# Patient Record
Sex: Female | Born: 1940 | Race: White | Hispanic: No | Marital: Married | Smoking: Never smoker
Health system: Southern US, Community
[De-identification: ages and names within clinical notes are randomized; demographics above are authoritative.]

## PROBLEM LIST (undated history)

## (undated) DIAGNOSIS — E785 Hyperlipidemia, unspecified: Secondary | ICD-10-CM

## (undated) DIAGNOSIS — K449 Diaphragmatic hernia without obstruction or gangrene: Secondary | ICD-10-CM

## (undated) DIAGNOSIS — T7840XA Allergy, unspecified, initial encounter: Secondary | ICD-10-CM

## (undated) DIAGNOSIS — K219 Gastro-esophageal reflux disease without esophagitis: Secondary | ICD-10-CM

## (undated) DIAGNOSIS — M8580 Other specified disorders of bone density and structure, unspecified site: Secondary | ICD-10-CM

## (undated) DIAGNOSIS — I10 Essential (primary) hypertension: Principal | ICD-10-CM

## (undated) DIAGNOSIS — I2510 Atherosclerotic heart disease of native coronary artery without angina pectoris: Secondary | ICD-10-CM

## (undated) DIAGNOSIS — I219 Acute myocardial infarction, unspecified: Secondary | ICD-10-CM

## (undated) DIAGNOSIS — J309 Allergic rhinitis, unspecified: Secondary | ICD-10-CM

## (undated) DIAGNOSIS — C4491 Basal cell carcinoma of skin, unspecified: Secondary | ICD-10-CM

## (undated) HISTORY — DX: Diaphragmatic hernia without obstruction or gangrene: K44.9

## (undated) HISTORY — PX: CATARACT EXTRACTION: SUR2

## (undated) HISTORY — PX: BASAL CELL CARCINOMA EXCISION: SHX1214

## (undated) HISTORY — PX: LAPAROSCOPIC ASSISTED VAGINAL HYSTERECTOMY: SHX5398

## (undated) HISTORY — DX: Allergic rhinitis, unspecified: J30.9

## (undated) HISTORY — DX: Acute myocardial infarction, unspecified: I21.9

## (undated) HISTORY — DX: Atherosclerotic heart disease of native coronary artery without angina pectoris: I25.10

## (undated) HISTORY — DX: Basal cell carcinoma of skin, unspecified: C44.91

## (undated) HISTORY — DX: Other specified disorders of bone density and structure, unspecified site: M85.80

## (undated) HISTORY — DX: Allergy, unspecified, initial encounter: T78.40XA

## (undated) HISTORY — PX: WISDOM TOOTH EXTRACTION: SHX21

## (undated) HISTORY — DX: Essential (primary) hypertension: I10

## (undated) HISTORY — DX: Hyperlipidemia, unspecified: E78.5

## (undated) HISTORY — PX: APPENDECTOMY: SHX54

## (undated) HISTORY — DX: Gastro-esophageal reflux disease without esophagitis: K21.9

---

## 1996-01-07 ENCOUNTER — Encounter: Payer: Self-pay | Admitting: Internal Medicine

## 1998-03-28 ENCOUNTER — Encounter: Payer: Self-pay | Admitting: Internal Medicine

## 2003-12-05 ENCOUNTER — Emergency Department (HOSPITAL_COMMUNITY): Admission: EM | Admit: 2003-12-05 | Discharge: 2003-12-05 | Payer: Self-pay | Admitting: Emergency Medicine

## 2006-12-23 DIAGNOSIS — I2510 Atherosclerotic heart disease of native coronary artery without angina pectoris: Secondary | ICD-10-CM

## 2006-12-23 HISTORY — DX: Atherosclerotic heart disease of native coronary artery without angina pectoris: I25.10

## 2007-09-23 HISTORY — PX: OTHER SURGICAL HISTORY: SHX169

## 2007-10-22 ENCOUNTER — Ambulatory Visit: Payer: Self-pay | Admitting: Cardiovascular Disease

## 2007-10-22 ENCOUNTER — Inpatient Hospital Stay (HOSPITAL_COMMUNITY): Admission: EM | Admit: 2007-10-22 | Discharge: 2007-10-25 | Payer: Self-pay | Admitting: Cardiovascular Disease

## 2007-10-22 ENCOUNTER — Encounter: Payer: Self-pay | Admitting: Cardiovascular Disease

## 2007-11-09 ENCOUNTER — Ambulatory Visit: Payer: Self-pay | Admitting: Cardiovascular Disease

## 2007-12-14 ENCOUNTER — Ambulatory Visit: Payer: Self-pay | Admitting: Cardiovascular Disease

## 2007-12-14 ENCOUNTER — Encounter: Payer: Self-pay | Admitting: Cardiovascular Disease

## 2007-12-14 ENCOUNTER — Ambulatory Visit: Payer: Self-pay

## 2007-12-14 LAB — CONVERTED CEMR LAB
ALT: 19 units/L (ref 0–35)
Albumin: 3.6 g/dL (ref 3.5–5.2)
Alkaline Phosphatase: 97 units/L (ref 39–117)
CO2: 28 meq/L (ref 19–32)
Chloride: 105 meq/L (ref 96–112)
GFR calc Af Amer: 92 mL/min
GFR calc non Af Amer: 76 mL/min
Glucose, Bld: 101 mg/dL — ABNORMAL HIGH (ref 70–99)
Total CHOL/HDL Ratio: 2.7
Total Protein: 6.8 g/dL (ref 6.0–8.3)

## 2008-02-23 ENCOUNTER — Ambulatory Visit: Payer: Self-pay | Admitting: Internal Medicine

## 2008-03-14 ENCOUNTER — Ambulatory Visit: Payer: Self-pay | Admitting: Cardiovascular Disease

## 2008-03-29 ENCOUNTER — Ambulatory Visit: Payer: Self-pay | Admitting: Internal Medicine

## 2008-03-29 DIAGNOSIS — I10 Essential (primary) hypertension: Secondary | ICD-10-CM

## 2008-03-29 DIAGNOSIS — E785 Hyperlipidemia, unspecified: Secondary | ICD-10-CM

## 2008-03-29 DIAGNOSIS — I252 Old myocardial infarction: Secondary | ICD-10-CM | POA: Insufficient documentation

## 2008-03-29 HISTORY — DX: Essential (primary) hypertension: I10

## 2008-03-29 HISTORY — DX: Hyperlipidemia, unspecified: E78.5

## 2008-03-29 LAB — HM MAMMOGRAPHY

## 2008-03-30 LAB — CONVERTED CEMR LAB
ALT: 23 units/L (ref 0–35)
Alkaline Phosphatase: 104 units/L (ref 39–117)
Basophils Relative: 0.4 % (ref 0.0–1.0)
Bilirubin, Direct: 0.1 mg/dL (ref 0.0–0.3)
Chloride: 107 meq/L (ref 96–112)
Cholesterol: 134 mg/dL (ref 0–200)
Creatinine, Ser: 0.8 mg/dL (ref 0.4–1.2)
Eosinophils Relative: 1.5 % (ref 0.0–5.0)
Glucose, Bld: 88 mg/dL (ref 70–99)
HCT: 39.7 % (ref 36.0–46.0)
HDL: 45.4 mg/dL (ref >39.0)
Hemoglobin: 12.7 g/dL (ref 12.0–15.0)
LDL Cholesterol: 77 mg/dL (ref 0–99)
MCHC: 32 g/dL (ref 30.0–36.0)
MCV: 89.8 fL (ref 78.0–100.0)
Monocytes Relative: 6.6 % (ref 3.0–12.0)
TSH: 2.56 microintl units/mL (ref 0.35–5.50)
Total Bilirubin: 0.7 mg/dL (ref 0.3–1.2)
Total CHOL/HDL Ratio: 3
Total Protein: 6.2 g/dL (ref 6.0–8.3)

## 2008-04-04 ENCOUNTER — Ambulatory Visit: Payer: Self-pay | Admitting: Internal Medicine

## 2008-04-08 DIAGNOSIS — J309 Allergic rhinitis, unspecified: Secondary | ICD-10-CM

## 2008-04-08 HISTORY — DX: Allergic rhinitis, unspecified: J30.9

## 2008-05-05 ENCOUNTER — Ambulatory Visit: Payer: Self-pay | Admitting: Gastroenterology

## 2008-05-05 DIAGNOSIS — K219 Gastro-esophageal reflux disease without esophagitis: Secondary | ICD-10-CM

## 2008-05-24 ENCOUNTER — Ambulatory Visit: Payer: Self-pay | Admitting: Cardiovascular Disease

## 2008-05-24 LAB — CONVERTED CEMR LAB
ALT: 18 units/L (ref 0–35)
Total Bilirubin: 0.8 mg/dL (ref 0.3–1.2)

## 2008-06-29 ENCOUNTER — Ambulatory Visit: Payer: Self-pay | Admitting: Internal Medicine

## 2008-09-29 ENCOUNTER — Ambulatory Visit: Payer: Self-pay | Admitting: Internal Medicine

## 2008-09-29 IMAGING — CR DG CHEST 1V PORT
1 series · 1 of 1 positions shown · non-contrast
Comparison: None.

CLINICAL DATA: Post cardiac catheterization. MI.

PORTABLE CHEST - 1 VIEW  [DATE]/2006 8220 hours:

[view not recorded]
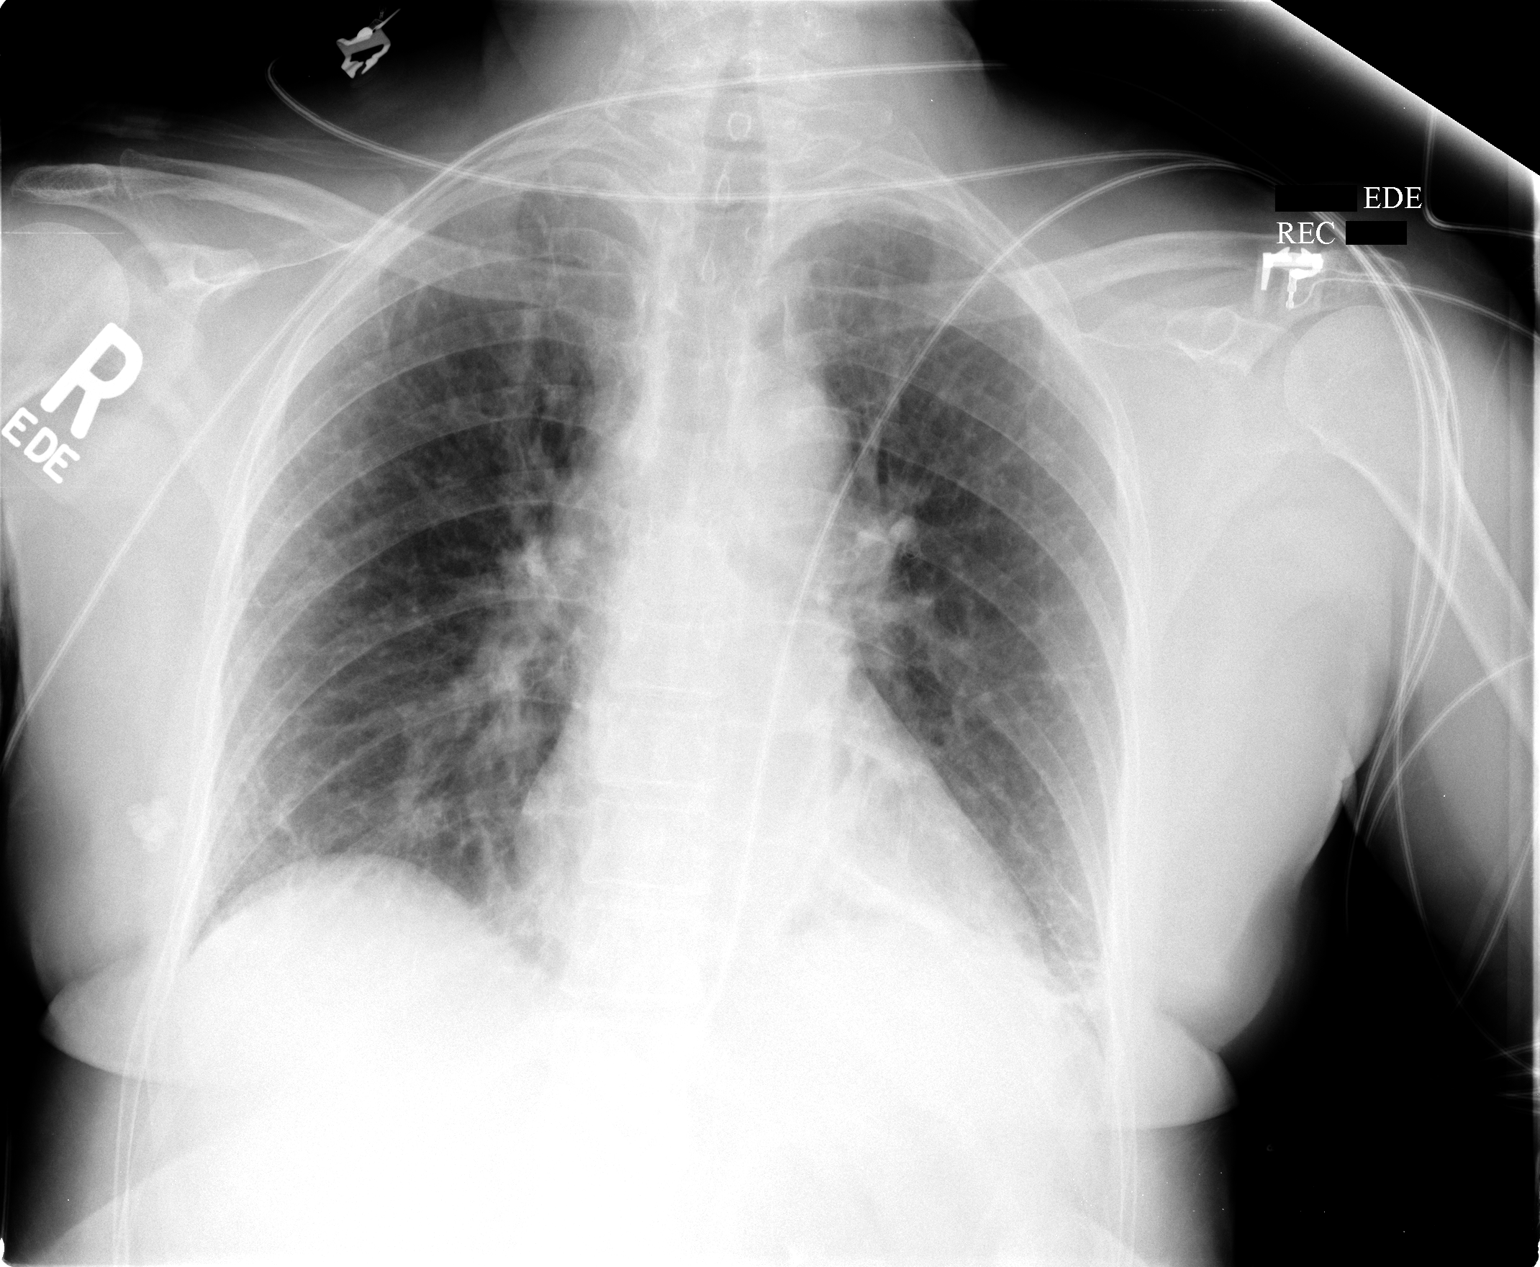

[1 of 1 positions shown; findings below may reference images not displayed]

FINDINGS: Heart mildly enlarged. Mild diffuse interstitial pulmonary edema.
Possible small bilateral pleural effusions. Linear atelectasis at the left base.
No localized airspace consolidation. Calcification noted within the right
breast.
IMPRESSION: Mild cardiomegaly and mild diffuse interstitial pulmonary edema. Probable small
effusions. Linear atelectasis and left lower lobe. Probable calcified
fibroadenoma in the right breast.

## 2008-10-19 ENCOUNTER — Ambulatory Visit: Payer: Self-pay | Admitting: Cardiovascular Disease

## 2008-10-28 ENCOUNTER — Ambulatory Visit: Payer: Self-pay | Admitting: Internal Medicine

## 2008-11-01 ENCOUNTER — Telehealth: Payer: Self-pay | Admitting: Internal Medicine

## 2008-11-01 LAB — CONVERTED CEMR LAB
Alkaline Phosphatase: 93 units/L (ref 39–117)
Bilirubin, Direct: 0.1 mg/dL (ref 0.0–0.3)
CO2: 29 meq/L (ref 19–32)
Chloride: 109 meq/L (ref 96–112)
Glucose, Bld: 109 mg/dL — ABNORMAL HIGH (ref 70–99)
LDL Cholesterol: 66 mg/dL (ref 0–99)
Potassium: 3.8 meq/L (ref 3.5–5.1)
Total Bilirubin: 0.8 mg/dL (ref 0.3–1.2)
Total Protein: 6.6 g/dL (ref 6.0–8.3)
VLDL: 10 mg/dL (ref 0–40)

## 2008-11-04 ENCOUNTER — Telehealth: Payer: Self-pay | Admitting: Internal Medicine

## 2008-11-07 ENCOUNTER — Telehealth: Payer: Self-pay | Admitting: Internal Medicine

## 2008-12-01 ENCOUNTER — Ambulatory Visit: Payer: Self-pay | Admitting: Gastroenterology

## 2008-12-09 ENCOUNTER — Ambulatory Visit: Payer: Self-pay | Admitting: Gastroenterology

## 2008-12-09 LAB — HM COLONOSCOPY

## 2009-03-22 DIAGNOSIS — I2589 Other forms of chronic ischemic heart disease: Secondary | ICD-10-CM | POA: Insufficient documentation

## 2009-03-28 ENCOUNTER — Encounter: Payer: Self-pay | Admitting: Cardiovascular Disease

## 2009-03-28 ENCOUNTER — Ambulatory Visit: Payer: Self-pay | Admitting: Cardiovascular Disease

## 2009-06-21 ENCOUNTER — Ambulatory Visit: Payer: Self-pay | Admitting: Internal Medicine

## 2009-06-22 LAB — CONVERTED CEMR LAB
ALT: 16 units/L (ref 0–35)
Alkaline Phosphatase: 75 units/L (ref 39–117)
BUN: 25 mg/dL — ABNORMAL HIGH (ref 6–23)
CO2: 29 meq/L (ref 19–32)
Chloride: 108 meq/L (ref 96–112)
Cholesterol: 153 mg/dL (ref 0–200)
Creatinine, Ser: 0.9 mg/dL (ref 0.4–1.2)
GFR calc non Af Amer: 66.02 mL/min (ref >60)
LDL Cholesterol: 83 mg/dL (ref 0–99)
TSH: 2.98 microintl units/mL (ref 0.35–5.50)
Total Protein: 6.8 g/dL (ref 6.0–8.3)
Triglycerides: 62 mg/dL (ref 0.0–149.0)

## 2009-07-17 ENCOUNTER — Encounter: Payer: Self-pay | Admitting: Internal Medicine

## 2009-09-27 ENCOUNTER — Ambulatory Visit: Payer: Self-pay | Admitting: Internal Medicine

## 2009-11-08 ENCOUNTER — Ambulatory Visit: Payer: Self-pay | Admitting: Cardiovascular Disease

## 2009-11-10 DIAGNOSIS — I2510 Atherosclerotic heart disease of native coronary artery without angina pectoris: Secondary | ICD-10-CM | POA: Insufficient documentation

## 2009-11-10 HISTORY — DX: Atherosclerotic heart disease of native coronary artery without angina pectoris: I25.10

## 2009-12-27 ENCOUNTER — Ambulatory Visit: Payer: Self-pay | Admitting: Internal Medicine

## 2010-01-01 LAB — CONVERTED CEMR LAB
ALT: 18 units/L (ref 0–35)
AST: 18 units/L (ref 0–37)
Albumin: 3.8 g/dL (ref 3.5–5.2)
BUN: 12 mg/dL (ref 6–23)
CO2: 30 meq/L (ref 19–32)
Calcium: 9.4 mg/dL (ref 8.4–10.5)
Chloride: 107 meq/L (ref 96–112)
Creatinine, Ser: 0.7 mg/dL (ref 0.4–1.2)
Glucose, Bld: 101 mg/dL — ABNORMAL HIGH (ref 70–99)
HDL: 56.5 mg/dL (ref >39.00)
TSH: 2.42 microintl units/mL (ref 0.35–5.50)
Total Bilirubin: 1 mg/dL (ref 0.3–1.2)
Total Protein: 6.7 g/dL (ref 6.0–8.3)

## 2010-06-27 ENCOUNTER — Ambulatory Visit: Payer: Self-pay | Admitting: Internal Medicine

## 2010-06-29 LAB — CONVERTED CEMR LAB
ALT: 17 units/L (ref 0–35)
AST: 18 units/L (ref 0–37)
Albumin: 4.2 g/dL (ref 3.5–5.2)
Alkaline Phosphatase: 82 units/L (ref 39–117)
BUN: 15 mg/dL (ref 6–23)
Basophils Relative: 0.3 % (ref 0.0–3.0)
Bilirubin, Direct: 0.1 mg/dL (ref 0.0–0.3)
CO2: 30 meq/L (ref 19–32)
Chloride: 111 meq/L (ref 96–112)
Creatinine, Ser: 0.8 mg/dL (ref 0.4–1.2)
Eosinophils Relative: 1 % (ref 0.0–5.0)
GFR calc non Af Amer: 74.33 mL/min (ref >60)
Glucose, Bld: 96 mg/dL (ref 70–99)
HCT: 38.2 % (ref 36.0–46.0)
Lymphocytes Relative: 23.1 % (ref 12.0–46.0)
Lymphs Abs: 2.1 10*3/uL (ref 0.7–4.0)
Monocytes Absolute: 0.5 10*3/uL (ref 0.1–1.0)
Neutro Abs: 6.3 10*3/uL (ref 1.4–7.7)
Platelets: 253 10*3/uL (ref 150.0–400.0)
Potassium: 5.7 meq/L — ABNORMAL HIGH (ref 3.5–5.1)
RDW: 14.6 % (ref 11.5–14.6)
Sodium: 147 meq/L — ABNORMAL HIGH (ref 135–145)
Total Protein: 7 g/dL (ref 6.0–8.3)

## 2010-07-02 ENCOUNTER — Ambulatory Visit: Payer: Self-pay | Admitting: Internal Medicine

## 2010-07-03 LAB — CONVERTED CEMR LAB: Potassium: 4.8 meq/L (ref 3.5–5.1)

## 2010-08-08 ENCOUNTER — Ambulatory Visit: Payer: Self-pay | Admitting: Internal Medicine

## 2010-10-04 ENCOUNTER — Ambulatory Visit: Payer: Self-pay | Admitting: Internal Medicine

## 2010-11-19 ENCOUNTER — Encounter: Payer: Self-pay | Admitting: Cardiovascular Disease

## 2010-11-19 ENCOUNTER — Ambulatory Visit: Payer: Self-pay | Admitting: Cardiovascular Disease

## 2010-12-10 ENCOUNTER — Telehealth: Payer: Self-pay | Admitting: Cardiovascular Disease

## 2011-01-22 NOTE — Assessment & Plan Note (Signed)
Summary: f1y  Medications Added LIPITOR 40 MG TABS (ATORVASTATIN CALCIUM) Take one tablet by mouth daily.      Allergies Added:   Visit Type:  Follow-up Primary Provider:  Birdie Sons MD  CC:  none.  History of Present Illness: 70 year old woman with coronary artery disease presents for followup. She had an anterior MI in 2008. 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 However, her overall LV function has returned to normal.   She has no complaints today. Denies chest pain, dyspnea, edema, or palpitations. She is compliant with her medications. She only takes omeprazole on occasion.    Current Medications (verified): 1)  Plavix 75 Mg  Tabs (Clopidogrel Bisulfate) .... One By Mouth Daily 2)  Carvedilol 12.5 Mg  Tabs (Carvedilol) .... One By Mouth Two Times A Day 3)  Simvastatin 40 Mg Tabs (Simvastatin) .... Take One Tablet At Bedtime 4)  Nitrostat 0.3 Mg  Subl (Nitroglycerin) .... Prn 5)  Aspirin 81 Mg  Tbec (Aspirin) .... One By Mouth Every Day 6)  Omeprazole 20 Mg Cpdr (Omeprazole) .... Two Times A Day 7)  Losartan Potassium 100 Mg Tabs (Losartan Potassium) .... Take 1 Tablet By Mouth Once A Day  Allergies (verified): 1)  ! Penicillin  Past History:  Past medical history reviewed for relevance to current acute and chronic problems.  Past Medical History: Reviewed history from 11/08/2009 and no changes required. CAD, UNSPECIFIED SITE (ICD-414.00) status post anterior myocardial infarction 2008, treated with a drug-eluting stent to the LAD. GERD (ICD-530.81) HYPERTENSION (ICD-401.9) HYPERLIPIDEMIA (ICD-272.4) ALLERGIC RHINITIS (ICD-477.9)  Review of Systems       Negative except as per HPI   Vital Signs:  Patient profile:   70 year old female Height:      65 inches Weight:      154.50 pounds BMI:     25.80 Pulse rate:   71 / minute Pulse rhythm:   regular Resp:     18 per minute BP sitting:   134 / 72  (left arm) Cuff  size:   regular  Vitals Entered By: Lisabeth Devoid RN (November 19, 2010 10:03 AM)  Physical Exam  General:  Pt is alert and oriented, in no acute distress. HEENT: normal Neck: normal carotid upstrokes without bruits, JVP normal Lungs: CTA CV: RRR without murmur or gallop Abd: soft, NT, positive BS, no bruit, no organomegaly Ext: no clubbing, cyanosis, or edema. peripheral pulses 2+ and equal Skin: warm and dry without rash    EKG  Procedure date:  11/19/2010  Findings:      NSR 71 bpm, within normal limits.  Impression & Recommendations:  Problem # 1:  CAD, NATIVE VESSEL (ICD-414.01) Pt stable without angina. She is 3 years out from her MI, but her coronary anatomy was complex (treated with overlapping DES at the site of a bifurcation). I would recommend staying on ASA and plavix indefinitely as long as she tolerates this. With plavix going generic in the near future this should be less of a financial for her.   Her updated medication list for this problem includes:    Plavix 75 Mg Tabs (Clopidogrel bisulfate) ..... One by mouth daily    Carvedilol 12.5 Mg Tabs (Carvedilol) ..... One by mouth two times a day    Nitrostat 0.3 Mg Subl (Nitroglycerin) .Marland Kitchen... Prn    Aspirin 81 Mg Tbec (Aspirin) ..... One by mouth every day  Orders: EKG w/ Interpretation (93000)  Problem # 2:  HYPERTENSION (ICD-401.9) BP controlled.   Her updated medication list for this problem includes:    Carvedilol 12.5 Mg Tabs (Carvedilol) ..... One by mouth two times a day    Aspirin 81 Mg Tbec (Aspirin) ..... One by mouth every day    Losartan Potassium 100 Mg Tabs (Losartan potassium) .Marland Kitchen... Take 1 tablet by mouth once a day  BP today: 134/72 Prior BP: 138/70 (08/08/2010)  Labs Reviewed: K+: 4.8 (07/02/2010) Creat: : 0.8 (06/27/2010)   Chol: 193 (06/27/2010)   HDL: 57.10 (06/27/2010)   LDL: 107 (06/27/2010)   TG: 144.0 (06/27/2010)  Problem # 3:  HYPERLIPIDEMIA (ICD-272.4) LDL above goal. She has  been on simvastatin because of cost. Recommend change to lipitor 40 mg in December when it goes generic. She has followup labs already scheduled with Dr Cato Mulligan in February.  Her updated medication list for this problem includes:    Lipitor 40 Mg Tabs (Atorvastatin calcium) .Marland Kitchen... Take one tablet by mouth daily.  CHOL: 193 (06/27/2010)   LDL: 107 (06/27/2010)   HDL: 57.10 (06/27/2010)   TG: 144.0 (06/27/2010)  Patient Instructions: 1)  Your physician recommends that you schedule a follow-up appointment in: 1 year with Dr. Excell Seltzer 2)  Your physician has recommended you make the following change in your medication:  Prescriptions: LIPITOR 40 MG TABS (ATORVASTATIN CALCIUM) Take one tablet by mouth daily.  #30 x 11   Entered by:   Lisabeth Devoid RN   Authorized by:   Norva Karvonen, MD   Signed by:   Norva Karvonen, MD on 11/19/2010   Method used:   Historical   RxID:   [id number]

## 2011-01-22 NOTE — Assessment & Plan Note (Signed)
Summary: 6 MNTH ROV//SLM   Vital Signs:  Patient profile:   70 year old female Weight:      154 pounds Temp:     98.2 degrees F Pulse rate:   64 / minute Resp:     12 per minute BP sitting:   146 / 86  (left arm)  Vitals Entered By: Gladis Riffle, RN (December 27, 2009 9:26 AM)   Primary Care Provider:  Birdie Sons MD   History of Present Illness:  Follow-Up Visit      This is a 70 year old woman who presents for Follow-up visit.  The patient denies chest pain, palpitations, dizziness, syncope, edema, SOB, DOE, PND, and orthopnea.  Since the last visit the patient notes no new problems or concerns.  The patient reports taking meds as prescribed.  When questioned about possible medication side effects, the patient notes none.    Preventive Screening-Counseling & Management  Alcohol-Tobacco     Smoking Status: never  Current Problems (verified): 1)  Cad, Native Vessel  (ICD-414.01) 2)  Ischemic Cardiomyopathy  (ICD-414.8) 3)  Gerd  (ICD-530.81) 4)  Hypertension  (ICD-401.9) 5)  Hyperlipidemia  (ICD-272.4) 6)  Myocardial Infarction, Hx of  (ICD-412) 7)  Allergic Rhinitis  (ICD-477.9)  Current Medications (verified): 1)  Plavix 75 Mg  Tabs (Clopidogrel Bisulfate) .... One By Mouth Daily 2)  Carvedilol 12.5 Mg  Tabs (Carvedilol) .... One By Mouth Bid 3)  Lisinopril 20 Mg Tabs (Lisinopril) .Marland Kitchen.. 1 Tablet By Mouth Daily 4)  Simvastatin 40 Mg Tabs (Simvastatin) .... Take One Tablet At Bedtime 5)  Nitrostat 0.3 Mg  Subl (Nitroglycerin) .... Prn 6)  Aspirin 81 Mg  Tbec (Aspirin) .... One By Mouth Every Day 7)  Pantoprazole Sodium 40 Mg Tbec (Pantoprazole Sodium) .... Take 1 Tablet By Mouth Once A Day  Allergies: 1)  ! Penicillin  Comments:  Nurse/Medical Assistant: 6 month rov, discuss pantoprazole  The patient's medications and allergies were reviewed with the patient and were updated in the Medication and Allergy Lists. Gladis Riffle, RN (December 27, 2009 9:27 AM)  Past  History:  Past Medical History: Last updated: 11/08/2009 CAD, UNSPECIFIED SITE (ICD-414.00) status post anterior myocardial infarction 2008, treated with a drug-eluting stent to the LAD. GERD (ICD-530.81) HYPERTENSION (ICD-401.9) HYPERLIPIDEMIA (ICD-272.4) ALLERGIC RHINITIS (ICD-477.9)  Past Surgical History: Last updated: 12-Apr-2009  Percutaneous transluminal coronary angioplasty and drug-eluting   stent placement in the left anterior descending 09-23-29-2008 Appendectomy  Family History: Last updated: 04/12/09  Her mother died with a history of coronary artery   disease, and she also has one brother with coronary artery disease who   has had a stent, but her father did not have heart disease.      Social History: Last updated: Apr 12, 2009 She lives in [address] with her husband and works at   [employer].  She has no history of alcohol, tobacco or drug abuse.  She has 3 boys and 1 girl.  Risk Factors: Caffeine Use: 2 (05/05/2008) Exercise: no (03/29/2008)  Risk Factors: Smoking Status: never (12/27/2009)  Review of Systems       All other systems reviewed and were negative   Physical Exam  General:  alert and well-developed.   Head:  normocephalic and atraumatic.   Eyes:  pupils equal and pupils round.   Ears:  R ear normal and L ear normal.   Neck:  No deformities, masses, or tenderness noted. Chest Wall:  no deformities and no  tenderness.   Lungs:  normal respiratory effort and no intercostal retractions.   Heart:  normal rate and regular rhythm.   Abdomen:  Soft, nontender and nondistended. No masses, hepatosplenomegaly or hernias noted. Normal bowel sounds. Msk:  No deformity or scoliosis noted of thoracic or lumbar spine.   Neurologic:  cranial nerves II-XII intact and gait normal.     Impression & Recommendations:  Problem # 1:  CAD, NATIVE VESSEL (ICD-414.01) no sxs continue current medications  Her updated medication list for this problem  includes:    Plavix 75 Mg Tabs (Clopidogrel bisulfate) ..... One by mouth daily    Carvedilol 12.5 Mg Tabs (Carvedilol) ..... One by mouth bid    Lisinopril 40 Mg Tabs (Lisinopril) .Marland Kitchen... 1 by mouth once daily    Nitrostat 0.3 Mg Subl (Nitroglycerin) .Marland Kitchen... Prn    Aspirin 81 Mg Tbec (Aspirin) ..... One by mouth every day  Labs Reviewed: Chol: 153 (06/21/2009)   HDL: 57.70 (06/21/2009)   LDL: 83 (06/21/2009)   TG: 62.0 (06/21/2009)  Problem # 2:  GERD (ICD-530.81) no sxs change meds because of cost The following medications were removed from the medication list:    Pantoprazole Sodium 40 Mg Tbec (Pantoprazole sodium) .Marland Kitchen... Take 1 tablet by mouth once a day Her updated medication list for this problem includes:    Omeprazole 20 Mg Cpdr (Omeprazole) ..... One by mouth daily  Problem # 3:  HYPERTENSION (ICD-401.9) increse meds see list side effects discussed Her updated medication list for this problem includes:    Carvedilol 12.5 Mg Tabs (Carvedilol) ..... One by mouth bid    Lisinopril 40 Mg Tabs (Lisinopril) .Marland Kitchen... 1 by mouth once daily  BP today: 146/86 Prior BP: 122/70 (11/08/2009)  Labs Reviewed: K+: 4.6 (06/21/2009) Creat: : 0.9 (06/21/2009)   Chol: 153 (06/21/2009)   HDL: 57.70 (06/21/2009)   LDL: 83 (06/21/2009)   TG: 62.0 (06/21/2009)  Orders: TLB-BMP (Basic Metabolic Panel-BMET) (80048-METABOL) Venipuncture (09811)  Problem # 4:  HYPERLIPIDEMIA (ICD-272.4) well controlled continue current medications  Her updated medication list for this problem includes:    Simvastatin 40 Mg Tabs (Simvastatin) .Marland Kitchen... Take one tablet at bedtime  Labs Reviewed: SGOT: 18 (06/21/2009)   SGPT: 16 (06/21/2009)   HDL:57.70 (06/21/2009), 49.7 (10/28/2008)  LDL:83 (06/21/2009), 66 (10/28/2008)  Chol:153 (06/21/2009), 126 (10/28/2008)  Trig:62.0 (06/21/2009), 51 (10/28/2008)  Orders: TLB-Lipid Panel (80061-LIPID) TLB-Hepatic/Liver Function Pnl (80076-HEPATIC) TLB-TSH (Thyroid Stimulating  Hormone) (84443-TSH)  Complete Medication List: 1)  Plavix 75 Mg Tabs (Clopidogrel bisulfate) .... One by mouth daily 2)  Carvedilol 12.5 Mg Tabs (Carvedilol) .... One by mouth bid 3)  Lisinopril 40 Mg Tabs (Lisinopril) .Marland Kitchen.. 1 by mouth once daily 4)  Simvastatin 40 Mg Tabs (Simvastatin) .... Take one tablet at bedtime 5)  Nitrostat 0.3 Mg Subl (Nitroglycerin) .... Prn 6)  Aspirin 81 Mg Tbec (Aspirin) .... One by mouth every day 7)  Omeprazole 20 Mg Cpdr (Omeprazole) .... One by mouth daily Prescriptions: SIMVASTATIN 40 MG TABS (SIMVASTATIN) Take one tablet at bedtime  #90 x 3   Entered and Authorized by:   Birdie Sons MD   Signed by:   Birdie Sons MD on 12/27/2009   Method used:   Electronically to        [facility] (retail)       [address]       [place], [place]  [address]  Ph: [phone]       Fax: [phone]   RxID:   [id number] LISINOPRIL 40 MG TABS (LISINOPRIL) 1 by mouth once daily  #90 x 3   Entered and Authorized by:   Birdie Sons MD   Signed by:   Birdie Sons MD on 12/27/2009   Method used:   Electronically to        [facility] (retail)       [address]       [place], [place]  [address]       Ph: [phone]       Fax: [phone]   RxID:   [id number] OMEPRAZOLE 20 MG CPDR (OMEPRAZOLE) one by mouth daily  #90 x 3   Entered and Authorized by:   Birdie Sons MD   Signed by:   Birdie Sons MD on 12/27/2009   Method used:   Electronically to        [facility] (retail)       [address]       [place], [place]  [address]       Ph: [phone]       Fax: [phone]   RxID:   [id number] PANTOPRAZOLE SODIUM 40 MG TBEC (PANTOPRAZOLE SODIUM) Take 1 tablet by mouth once a day  #90 x 3   Entered and Authorized by:   Birdie Sons MD   Signed by:   Birdie Sons MD on 12/27/2009   Method used:   Electronically to        [facility].* (retail)       [address]       [place], [place]  [address]       Ph: [phone]       Fax: [phone]   RxID:   [id number]

## 2011-01-22 NOTE — Assessment & Plan Note (Signed)
Summary: 6 MONTH ROA//LH   Vital Signs:  Patient profile:   70 year old female Weight:      156 pounds BMI:     26.05 Temp:     98.3 degrees F oral Pulse rate:   62 / minute Pulse rhythm:   regular Resp:     12 per minute BP sitting:   124 / 68  (left arm) Cuff size:   regular  Vitals Entered By: Gladis Riffle, RN (June 27, 2010 9:09 AM) CC: 6 month rov, fasting Is Patient Diabetic? No   Primary Care Provider:  Birdie Sons MD  CC:  6 month rov and fasting.  History of Present Illness:  Follow-Up Visit      This is a 70 year old woman who presents for Follow-up visit.  The patient denies chest pain and palpitations.  Since the last visit the patient notes no new problems or concerns.  The patient reports taking meds as prescribed.  When questioned about possible medication side effects, the patient notes none.    All other systems reviewed and were negative   Preventive Screening-Counseling & Management  Alcohol-Tobacco     Smoking Status: never  Current Problems (verified): 1)  Cad, Native Vessel  (ICD-414.01) 2)  Ischemic Cardiomyopathy  (ICD-414.8) 3)  Gerd  (ICD-530.81) 4)  Hypertension  (ICD-401.9) 5)  Hyperlipidemia  (ICD-272.4) 6)  Myocardial Infarction, Hx of  (ICD-412) 7)  Allergic Rhinitis  (ICD-477.9)  Current Medications (verified): 1)  Plavix 75 Mg  Tabs (Clopidogrel Bisulfate) .... One By Mouth Daily 2)  Carvedilol 12.5 Mg  Tabs (Carvedilol) .... One By Mouth Two Times A Day 3)  Lisinopril 40 Mg Tabs (Lisinopril) .Marland Kitchen.. 1 By Mouth Once Daily 4)  Simvastatin 40 Mg Tabs (Simvastatin) .... Take One Tablet At Bedtime 5)  Nitrostat 0.3 Mg  Subl (Nitroglycerin) .... Prn 6)  Aspirin 81 Mg  Tbec (Aspirin) .... One By Mouth Every Day 7)  Omeprazole 20 Mg Cpdr (Omeprazole) .... One By Mouth Daily  Allergies: 1)  ! Penicillin  Past History:  Past Medical History: Last updated: 11/08/2009 CAD, UNSPECIFIED SITE (ICD-414.00) status post anterior myocardial  infarction 2008, treated with a drug-eluting stent to the LAD. GERD (ICD-530.81) HYPERTENSION (ICD-401.9) HYPERLIPIDEMIA (ICD-272.4) ALLERGIC RHINITIS (ICD-477.9)  Past Surgical History: Last updated: 03-29-09  Percutaneous transluminal coronary angioplasty and drug-eluting   stent placement in the left anterior descending 09-23-29-2008 Appendectomy  Family History: Last updated: 2009-03-29  Her mother died with a history of coronary artery   disease, and she also has one brother with coronary artery disease who   has had a stent, but her father did not have heart disease.      Social History: Last updated: 06/27/2010 She lives in [address] with her husband and works at   [employer].   She has no history of alcohol, tobacco or drug abuse.  She has 3 boys and 1 girl.  Risk Factors: Caffeine Use: 2 (05/05/2008) Exercise: no (03/29/2008)  Risk Factors: Smoking Status: never (06/27/2010)  Social History: She lives in [place] with her husband and works at   [employer].   She has no history of alcohol, tobacco or drug abuse.  She has 3 boys and 1 girl.  Physical Exam  General:  alert and well-developed.   Head:  normocephalic and atraumatic.   Eyes:  pupils equal and pupils round.   Ears:  R ear normal and L ear normal.  Neck:  No deformities, masses, or tenderness noted. Chest Wall:  no deformities and no tenderness.   Lungs:  Normal respiratory effort, chest expands symmetrically. Lungs are clear to auscultation, no crackles or wheezes. Heart:  normal rate and regular rhythm.   Abdomen:  Soft, nontender and nondistended. No masses, hepatosplenomegaly or hernias noted. Normal bowel sounds.   Impression & Recommendations:  Problem # 1:  CAD, NATIVE VESSEL (ICD-414.01)  no sxs Her updated medication list for this problem includes:    Plavix 75 Mg Tabs (Clopidogrel bisulfate) ..... One by mouth daily    Carvedilol 12.5 Mg Tabs (Carvedilol) .....  One by mouth two times a day    Lisinopril 40 Mg Tabs (Lisinopril) .Marland Kitchen... 1 by mouth once daily    Nitrostat 0.3 Mg Subl (Nitroglycerin) .Marland Kitchen... Prn    Aspirin 81 Mg Tbec (Aspirin) ..... One by mouth every day  Labs Reviewed: Chol: 161 (12/27/2009)   HDL: 56.50 (12/27/2009)   LDL: 88 (12/27/2009)   TG: 85.0 (12/27/2009)  Orders: Venipuncture (09811) TLB-Lipid Panel (80061-LIPID) TLB-Hepatic/Liver Function Pnl (80076-HEPATIC)  Problem # 2:  ISCHEMIC CARDIOMYOPATHY (ICD-414.8) no sxs Her updated medication list for this problem includes:    Plavix 75 Mg Tabs (Clopidogrel bisulfate) ..... One by mouth daily    Carvedilol 12.5 Mg Tabs (Carvedilol) ..... One by mouth two times a day    Lisinopril 40 Mg Tabs (Lisinopril) .Marland Kitchen... 1 by mouth once daily    Nitrostat 0.3 Mg Subl (Nitroglycerin) .Marland Kitchen... Prn    Aspirin 81 Mg Tbec (Aspirin) ..... One by mouth every day  Problem # 3:  GERD (ICD-530.81) this is her main complaint increase PPI to two times a day if no results may need to consider definitive procedure Her updated medication list for this problem includes:    Omeprazole 20 Mg Cpdr (Omeprazole) .Marland Kitchen..Marland Kitchen Two times a day  Complete Medication List: 1)  Plavix 75 Mg Tabs (Clopidogrel bisulfate) .... One by mouth daily 2)  Carvedilol 12.5 Mg Tabs (Carvedilol) .... One by mouth two times a day 3)  Lisinopril 40 Mg Tabs (Lisinopril) .Marland Kitchen.. 1 by mouth once daily 4)  Simvastatin 40 Mg Tabs (Simvastatin) .... Take one tablet at bedtime 5)  Nitrostat 0.3 Mg Subl (Nitroglycerin) .... Prn 6)  Aspirin 81 Mg Tbec (Aspirin) .... One by mouth every day 7)  Omeprazole 20 Mg Cpdr (Omeprazole) .... Two times a day  Other Orders: TLB-TSH (Thyroid Stimulating Hormone) (84443-TSH) TLB-BMP (Basic Metabolic Panel-BMET) (80048-METABOL) TLB-CBC Platelet - w/Differential (85025-CBCD)  Patient Instructions: 1)  see me 6 weeks Prescriptions: OMEPRAZOLE 20 MG CPDR (OMEPRAZOLE) two times a day  #180 x 3   Entered  and Authorized by:   Birdie Sons MD   Signed by:   Birdie Sons MD on 06/27/2010   Method used:   Electronically to        [facility] (retail)       [address]       [place], [place]  [address]       Ph: [phone]       Fax: [phone]   RxID:   [id number]

## 2011-01-22 NOTE — Assessment & Plan Note (Signed)
Summary: ROA/FUP/RCD   Vital Signs:  Patient profile:   70 year old female Weight:      158 pounds Temp:     98.5 degrees F oral Pulse rate:   60 / minute Pulse rhythm:   regular Resp:     12 per minute BP sitting:   138 / 70  (left arm) Cuff size:   regular  Vitals Entered By: Gladis Riffle, RN (August 08, 2010 9:38 AM) CC: 1-2 month rov, states acid reflux better with less flares Is Patient Diabetic? No   Primary Care Provider:  Birdie Sons MD  CC:  1-2 month rov and states acid reflux better with less flares.  History of Present Illness: hyperkalemia---resolved  coronary artery disease---no cp, sob, pnd, orthopnea  she complains of a cough---ongoing for months/years. she has tried to blame on cologne's etc  htn---no side effects on meds except ? cough as above  All other systems reviewed and were negative   Preventive Screening-Counseling & Management  Alcohol-Tobacco     Smoking Status: never  Current Problems (verified): 1)  Cad, Native Vessel  (ICD-414.01) 2)  Ischemic Cardiomyopathy  (ICD-414.8) 3)  Gerd  (ICD-530.81) 4)  Hypertension  (ICD-401.9) 5)  Hyperlipidemia  (ICD-272.4) 6)  Myocardial Infarction, Hx of  (ICD-412) 7)  Allergic Rhinitis  (ICD-477.9)  Current Medications (verified): 1)  Plavix 75 Mg  Tabs (Clopidogrel Bisulfate) .... One By Mouth Daily 2)  Carvedilol 12.5 Mg  Tabs (Carvedilol) .... One By Mouth Two Times A Day 3)  Lisinopril 40 Mg Tabs (Lisinopril) .Marland Kitchen.. 1 By Mouth Once Daily 4)  Simvastatin 40 Mg Tabs (Simvastatin) .... Take One Tablet At Bedtime 5)  Nitrostat 0.3 Mg  Subl (Nitroglycerin) .... Prn 6)  Aspirin 81 Mg  Tbec (Aspirin) .... One By Mouth Every Day 7)  Omeprazole 20 Mg Cpdr (Omeprazole) .... Two Times A Day  Allergies: 1)  ! Penicillin  Past History:  Past Medical History: Last updated: 11/08/2009 CAD, UNSPECIFIED SITE (ICD-414.00) status post anterior myocardial infarction 2008, treated with a drug-eluting stent to  the LAD. GERD (ICD-530.81) HYPERTENSION (ICD-401.9) HYPERLIPIDEMIA (ICD-272.4) ALLERGIC RHINITIS (ICD-477.9)  Past Surgical History: Last updated: 2009/03/24  Percutaneous transluminal coronary angioplasty and drug-eluting   stent placement in the left anterior descending 09-23-29-2008 Appendectomy  Family History: Last updated: March 24, 2009  Her mother died with a history of coronary artery   disease, and she also has one brother with coronary artery disease who   has had a stent, but her father did not have heart disease.      Social History: Last updated: 06/27/2010 She lives in [address] with her husband and works at   [employer].   She has no history of alcohol, tobacco or drug abuse.  She has 3 boys and 1 girl.  Risk Factors: Caffeine Use: 2 (05/05/2008) Exercise: no (03/29/2008)  Risk Factors: Smoking Status: never (08/08/2010)  Physical Exam  General:  alert and well-developed.   Head:  normocephalic and atraumatic.   Neck:  No deformities, masses, or tenderness noted. Chest Wall:  no deformities and no tenderness.   Lungs:  Normal respiratory effort, chest expands symmetrically. Lungs are clear to auscultation, no crackles or wheezes. Heart:  normal rate and regular rhythm.   Abdomen:  soft and non-tender.   Skin:  turgor normal and color normal.     Impression & Recommendations:  Problem # 1:  COUGH (ICD-786.2) ? ace-i induced  Problem # 2:  HYPERTENSION (ICD-401.9) controlled  but ? cough change to losartan The following medications were removed from the medication list:    Lisinopril 40 Mg Tabs (Lisinopril) .Marland Kitchen... 1 by mouth once daily Her updated medication list for this problem includes:    Carvedilol 12.5 Mg Tabs (Carvedilol) ..... One by mouth two times a day    Losartan Potassium 100 Mg Tabs (Losartan potassium) .Marland Kitchen... Take 1 tablet by mouth once a day  BP today: 138/70 Prior BP: 124/68 (06/27/2010)  Labs Reviewed: K+: 4.8  (07/02/2010) Creat: : 0.8 (06/27/2010)   Chol: 193 (06/27/2010)   HDL: 57.10 (06/27/2010)   LDL: 107 (06/27/2010)   TG: 144.0 (06/27/2010)  Problem # 3:  HYPERLIPIDEMIA (ICD-272.4) controlled  Her updated medication list for this problem includes:    Simvastatin 40 Mg Tabs (Simvastatin) .Marland Kitchen... Take one tablet at bedtime  Labs Reviewed: SGOT: 18 (06/27/2010)   SGPT: 17 (06/27/2010)   HDL:57.10 (06/27/2010), 56.50 (12/27/2009)  LDL:107 (06/27/2010), 88 (41/32/4401)  Chol:193 (06/27/2010), 161 (12/27/2009)  Trig:144.0 (06/27/2010), 85.0 (12/27/2009)  Problem # 4:  GERD (ICD-530.81) controlled continue current medications  Her updated medication list for this problem includes:    Omeprazole 20 Mg Cpdr (Omeprazole) .Marland Kitchen..Marland Kitchen Two times a day  Complete Medication List: 1)  Plavix 75 Mg Tabs (Clopidogrel bisulfate) .... One by mouth daily 2)  Carvedilol 12.5 Mg Tabs (Carvedilol) .... One by mouth two times a day 3)  Simvastatin 40 Mg Tabs (Simvastatin) .... Take one tablet at bedtime 4)  Nitrostat 0.3 Mg Subl (Nitroglycerin) .... Prn 5)  Aspirin 81 Mg Tbec (Aspirin) .... One by mouth every day 6)  Omeprazole 20 Mg Cpdr (Omeprazole) .... Two times a day 7)  Losartan Potassium 100 Mg Tabs (Losartan potassium) .... Take 1 tablet by mouth once a day  Patient Instructions: 1)  Please schedule a follow-up appointment in 6 months. Prescriptions: LOSARTAN POTASSIUM 100 MG TABS (LOSARTAN POTASSIUM) Take 1 tablet by mouth once a day  #30 x 6   Entered and Authorized by:   Birdie Sons MD   Signed by:   Birdie Sons MD on 08/08/2010   Method used:   Electronically to        [facility] (retail)       [address]       [place], [place]  [address]       Ph: [phone]       Fax: [phone]   RxID:   [id number]

## 2011-01-22 NOTE — Assessment & Plan Note (Signed)
Summary: FLU SHOT./NJR  Nurse Visit   Allergies: 1)  ! Penicillin  Orders Added: 1)  Admin 1st Vaccine [90471] 2)  Flu Vaccine 58yrs + [16109]       Flu Vaccine Consent Questions     Do you have a history of severe allergic reactions to this vaccine? no    Any prior history of allergic reactions to egg and/or gelatin? no    Do you have a sensitivity to the preservative Thimersol? no    Do you have a past history of Guillan-Barre Syndrome? no    Do you currently have an acute febrile illness? no    Have you ever had a severe reaction to latex? no    Vaccine information given and explained to patient? yes    Are you currently pregnant? no    Lot Number:[id number]   Exp Date:06/22/2011   Site Given  Left Deltoid IM Romualdo Bolk, CMA (AAMA)  October 26, 2010 8:08 AM

## 2011-01-24 NOTE — Progress Notes (Signed)
Summary: Generic Lipitor  Medications Added LIPITOR 40 MG TABS (ATORVASTATIN CALCIUM) Take one tablet by mouth daily.       Phone Note Call from Patient Call back at Home Phone [phone]   Caller: Patient Reason for Call: Talk to Nurse, Talk to Doctor Summary of Call: pt wanted to change to lipitor generic and she needs that called in if it is ok Initial call taken by: Omer Jack,  December 10, 2010 10:13 AM  Follow-up for Phone Call        The pt requested a new Rx be sent to [facility] for generic Lipitor.  Comment was written in Rx for pt to get generic.  Follow-up by: Julieta Gutting, RN, BSN,  December 10, 2010 11:09 AM    New/Updated Medications: LIPITOR 40 MG TABS (ATORVASTATIN CALCIUM) Take one tablet by mouth daily. Prescriptions: LIPITOR 40 MG TABS (ATORVASTATIN CALCIUM) Take one tablet by mouth daily.  #90 x 3   Entered by:   Julieta Gutting, RN, BSN   Authorized by:   Norva Karvonen, MD   Signed by:   Julieta Gutting, RN, BSN on 12/10/2010   Method used:   Electronically to        [facility] (retail)       [address]       [place], [place]  [address]       Ph: [phone]       Fax: [phone]   RxID:   [id number]

## 2011-02-05 ENCOUNTER — Encounter: Payer: Self-pay | Admitting: Internal Medicine

## 2011-02-06 ENCOUNTER — Encounter: Payer: Self-pay | Admitting: Internal Medicine

## 2011-02-06 ENCOUNTER — Ambulatory Visit (INDEPENDENT_AMBULATORY_CARE_PROVIDER_SITE_OTHER): Payer: MEDICARE | Admitting: Internal Medicine

## 2011-02-06 DIAGNOSIS — I2510 Atherosclerotic heart disease of native coronary artery without angina pectoris: Secondary | ICD-10-CM

## 2011-02-06 DIAGNOSIS — I2589 Other forms of chronic ischemic heart disease: Secondary | ICD-10-CM

## 2011-02-06 DIAGNOSIS — I10 Essential (primary) hypertension: Secondary | ICD-10-CM

## 2011-02-06 DIAGNOSIS — E785 Hyperlipidemia, unspecified: Secondary | ICD-10-CM

## 2011-02-06 LAB — LIPID PANEL
HDL: 43.6 mg/dL (ref >39.00)
Triglycerides: 56 mg/dL (ref 0.0–149.0)
VLDL: 11.2 mg/dL (ref 0.0–40.0)

## 2011-02-06 LAB — HEPATIC FUNCTION PANEL
ALT: 16 U/L (ref 0–35)
AST: 17 U/L (ref 0–37)
Albumin: 3.6 g/dL (ref 3.5–5.2)
Alkaline Phosphatase: 107 U/L (ref 39–117)
Total Protein: 6.5 g/dL (ref 6.0–8.3)

## 2011-02-06 LAB — BASIC METABOLIC PANEL: Glucose, Bld: 97 mg/dL (ref 70–99)

## 2011-02-06 MED ORDER — ATORVASTATIN CALCIUM 80 MG PO TABS: 80.0000 mg | ORAL_TABLET | Freq: Every day | ORAL | Status: DC

## 2011-02-06 MED ORDER — CARVEDILOL 25 MG PO TABS: 25.0000 mg | ORAL_TABLET | Freq: Two times a day (BID) | ORAL | Status: DC

## 2011-02-06 NOTE — Assessment & Plan Note (Signed)
Medications have changed since last time I seen her. Previous LDL was 101 with an adequate HDL. Her medication since that time have changed to Lipitor 40 mg by mouth daily. All checked lipid profile today.

## 2011-02-06 NOTE — Progress Notes (Signed)
  Subjective:    Patient ID: Jennifer Gross, female    DOB: [birth year], 70 y.o.   MRN: [id number]  HPI  patient comes in for multiple medical problems including coronary disease, hyperlipidemia, hypertension. She's feeling well without any chest pain shortness shortness of breath, PND, orthopnea. She is tolerating her medications well. Since I have  seen her, her Lipitor has been started. Previously she was on simvastatin. She is tolerating her medications will purchase a monitor blood pressure at home. She does not exercise regularly.   Review of Systems  patient denies chest pain, shortness of breath, orthopnea. Denies lower extremity edema, abdominal pain, change in appetite, change in bowel movements. Patient denies rashes, musculoskeletal complaints. No other specific complaints in a complete review of systems.      Objective:   Physical Exam  Well-developed well-nourished female in no acute distress. HEENT exam atraumatic, normocephalic, extraocular muscles are intact. Neck is supple. No jugular venous distention no thyromegaly. Chest clear to auscultation without increased work of breathing. Cardiac exam S1 and S2 are regular. Abdominal exam active bowel sounds, soft, nontender. Extremities no edema. Neurologic exam she is alert without any motor sensory deficits. Gait is normal.        Assessment & Plan:

## 2011-02-06 NOTE — Assessment & Plan Note (Signed)
No recurrent symptoms. Continue current medications outside of the ones that change. See medication list.

## 2011-02-06 NOTE — Assessment & Plan Note (Signed)
Not as well controlled. Will increase carvedilol to 25 mg by mouth twice a day. She will monitor blood pressure at home.

## 2011-02-07 LAB — CBC WITH DIFFERENTIAL/PLATELET
Basophils Absolute: 0 10*3/uL (ref 0.0–0.1)
Basophils Relative: 0.2 % (ref 0.0–3.0)
Eosinophils Relative: 1.2 % (ref 0.0–5.0)
HCT: 39.3 % (ref 36.0–46.0)
Lymphocytes Relative: 25.8 % (ref 12.0–46.0)
MCHC: 33.4 g/dL (ref 30.0–36.0)
MCV: 91.8 fl (ref 78.0–100.0)
Monocytes Relative: 3.2 % (ref 3.0–12.0)
Neutro Abs: 6 10*3/uL (ref 1.4–7.7)
RBC: 4.28 Mil/uL (ref 3.87–5.11)

## 2011-02-12 ENCOUNTER — Telehealth: Payer: Self-pay | Admitting: *Deleted

## 2011-02-12 NOTE — Telephone Encounter (Signed)
Pt was wanting a refill on lipitor but Dr Cato Mulligan all ready took care of this, pt aware

## 2011-03-01 ENCOUNTER — Telehealth: Payer: Self-pay | Admitting: Internal Medicine

## 2011-03-01 ENCOUNTER — Other Ambulatory Visit: Payer: Self-pay | Admitting: Internal Medicine

## 2011-03-01 DIAGNOSIS — I10 Essential (primary) hypertension: Secondary | ICD-10-CM

## 2011-03-01 MED ORDER — CARVEDILOL 25 MG PO TABS: 25.0000 mg | ORAL_TABLET | Freq: Two times a day (BID) | ORAL | Status: DC

## 2011-03-01 NOTE — Telephone Encounter (Signed)
Triage vm------Carvedilol 25mg  bid # 30 was received electronically. Please verify. Quantities and the daily supplies are not matching up. Call [phone]

## 2011-03-01 NOTE — Telephone Encounter (Signed)
Changed to 60

## 2011-03-12 ENCOUNTER — Telehealth: Payer: Self-pay | Admitting: *Deleted

## 2011-03-12 NOTE — Telephone Encounter (Signed)
Pt calls stating had tick bite and pt removed tick and head- instructed to clean well and use alcohol- if any redness or myalgia or fever occurs ,call and make appointment-pt iunderstood

## 2011-05-02 ENCOUNTER — Other Ambulatory Visit: Payer: Self-pay | Admitting: *Deleted

## 2011-05-02 MED ORDER — ATORVASTATIN CALCIUM 80 MG PO TABS: 40.0000 mg | ORAL_TABLET | Freq: Every day | ORAL | Status: DC

## 2011-05-03 ENCOUNTER — Other Ambulatory Visit: Payer: Self-pay | Admitting: Internal Medicine

## 2011-05-03 NOTE — Telephone Encounter (Signed)
rx called in

## 2011-05-03 NOTE — Telephone Encounter (Signed)
Pt called the [facility]  In [place], and was told that no med had been sent in for pt. Pls send atorvastatin (LIPITOR) 80 MG tablet to pharmacy asap today.

## 2011-05-07 NOTE — H&P (Signed)
NAMESHYAN, SCALISI NO.:  [id number]   MEDICAL RECORD NO.:  [id number]          PATIENT TYPE:  OIB   LOCATION:  2899                         FACILITY:  [facility]   PHYSICIAN:  Veverly Fells. Excell Seltzer, MD  DATE OF BIRTH:  Oct 09, 1941   DATE OF ADMISSION:  10/22/2007  DATE OF DISCHARGE:                              HISTORY & PHYSICAL   PRIMARY CARE PHYSICIAN:  Valetta Mole. Swords, M.D.   PRIMARY CARDIOLOGIST:  New and will be Dr. Excell Seltzer.   CHIEF COMPLAINT:  Chest pain.   HISTORY OF PRESENT ILLNESS:  Jennifer Gross is a 70 year old female with no  previous history of coronary artery disease.  In fact, Jennifer Gross does not have  a history of much of anything.  Today Jennifer Gross was at work at approximately  8:00 a.m. and had sudden onset of substernal chest pain.  Jennifer Gross stated it  was severe.  EMS was called.  They gave Jennifer Gross an 81 mg aspirin x4 as well  as sublingual nitroglycerin x3 and morphine.  After receiving all of  these medications, Jennifer Gross was pain-free.  Jennifer Gross EKG was abnormal, and Jennifer Gross was  noted to have dynamic EKG changes and chest pain, so Jennifer Gross was brought  emergently to the cath lab.  Jennifer Gross was pain-free on arrival.  This is the  first episode of chest pain Jennifer Gross has ever had in Jennifer Gross life.  Upon waking  this morning, Jennifer Gross stated Jennifer Gross felt Jennifer Gross was in Jennifer Gross usual state of health  with no issues or difficulties.   PAST MEDICAL HISTORY:  Jennifer Gross has no history of diabetes, hypertension,  hyperlipidemia, or tobacco use.  Jennifer Gross has not had a checkup in a while.  Jennifer Gross has seasonal allergies.   SURGICAL HISTORY:  Appendectomy.   MEDICATIONS:  Claritin p.r.n.   SOCIAL HISTORY:  Jennifer Gross lives in [address] with Jennifer Gross husband and works at  [employer].  Jennifer Gross has no history of alcohol, tobacco or drug abuse.   FAMILY HISTORY:  Jennifer Gross mother died with a history of coronary artery  disease, and Jennifer Gross also has one brother with coronary artery disease who  has had a stent, but Jennifer Gross father did not have heart disease.   REVIEW OF SYSTEMS:  Jennifer Gross has occasional arthralgias.  Jennifer Gross denies any  recent fevers, chills, cough, or wheezing.  Jennifer Gross has not had any melena,  hematemesis, or hemoptysis.  Jennifer Gross has not had any bright red blood per  rectum.  Review of systems is otherwise negative.   PHYSICAL EXAMINATION:  VITAL SIGNS:  Blood pressure 105/61, heart rate  84, respiratory rate 17, O2 saturation 94% on 2 liters.  GENERAL:  Jennifer Gross is a well-developed elderly white female in no acute  distress.  HEENT:  Normal.  NECK:  There is no lymphadenopathy, thyromegaly, bruit, or JVD noted.  CV:  Heart is regular in rate and rhythm with an S1-S2, and no  significant murmur or gallop is noted.  LUNGS:  Essentially clear to auscultation bilaterally.  SKIN:  No rashes or lesions are noted.  ABDOMEN:  Soft and nontender with active bowel sounds.  EXTREMITIES:  There is no cyanosis, clubbing, or edema noted.  Distal  pulses are 2-3+ in all 4 extremities and no femoral bruits are  appreciated.  MUSCULOSKELETAL:  There is no joint deformity or effusion.  NEUROLOGIC:  Jennifer Gross is alert and oriented.  Cranial nerves II-XII grossly  intact.   Chest x-ray and laboratory values are pending.   Initial EKG shows lateral ST elevation with inferior reciprocal changes.  Repeat EKG after treatment shows left ST elevation but more inferior T  wave changes.   IMPRESSION:  ST elevation myocardial infarction.  Even though the  patient is currently pain-free, Jennifer Gross electrocardiogram is significantly  abnormal.  Jennifer Gross is being taken emergently to the catheterization  laboratory and further evaluation and treatment will depend on results  of the cardiac catheterization.  We will check a lipid profile,  hemoglobin A1c, and other screening labs.      Jennifer Demark, PA-C      Veverly Fells. Excell Seltzer, MD  Electronically Signed    RB/MEDQ  D:  10/22/2007  T:  10/22/2007  Job:  [id number]   cc:   Valetta Mole. Swords, MD

## 2011-05-07 NOTE — Assessment & Plan Note (Signed)
[facility]                            CARDIOLOGY OFFICE NOTE   Jennifer Gross, Jennifer Gross                      MRN:          [id number]  DATE:11/09/2007                            DOB:          20-Feb-1941    Jennifer Gross returns for followup at [facility] Cardiology office on  November 09, 2007.  Jennifer Gross is a delightful 70 year old woman who  presented with an anterolateral myocardial infarction on October 30.  She was brought directly to the cardiac catheterization laboratory by  EMS in the setting of ongoing ST-elevation.  At catheterization we found  an occluded LAD and she was treated with overlapping drug-eluting stents  in that vessel.  This was a complex lesion involving a large diagonal  branch and angioplasty of the diagonal branch was performed as well.  Prior to that, Jennifer Gross had no history of coronary artery disease or  any cardiac problems.  She really had no risk factors identified.  Ms.  Gross' post MI echocardiogram demonstrated a large area of periapical  akinesis but overall she only had mild to moderate segmental LV  dysfunction with an estimated LVEF of 45%.   Since discharge home she has felt well.  She has not been participating  in any regular exercise or cardiac rehab yet.  She has had a modest  activity level with daily walking up to 15 minutes per day and no  symptoms with that level of activity.  She specifically denies chest  pain, dyspnea, edema, orthopnea, PND, lightheadedness, palpitations, or  syncope.   CURRENT MEDICATIONS:  1. Aspirin 325 mg daily.  2. Plavix 75 mg daily.  3. Carvedilol 3.125 mg twice daily.  4. Lipitor 80 mg at bedtime.  5. Captopril 6.25 mg one-half t.i.d.   ALLERGIES:  No known drug allergies.   EXAMINATION:  She is alert and oriented in no acute distress.  Weight is  154, blood pressure 150/80, heart rate 76, respiratory rate 16.  HEENT:  Normal.  NECK:  Normal carotid upstrokes  without bruits.  Jugular venous pressure  normal.  LUNGS:  Clear to auscultation bilaterally.  HEART:  Regular rate and rhythm without murmurs or gallops.  ABDOMEN:  Soft, nontender, no organomegaly, no abdominal bruits.  EXTREMITIES:  No clubbing, cyanosis, or edema.  Peripheral pulses 2+ and  equal throughout.   EKG shows normal sinus rhythm with marked anterolateral T-wave  abnormality, consider anterolateral ischemia.   ASSESSMENT:  1. Status post anterolateral myocardial infarction.  The patient is      tolerating post MI medical therapy well.  We reviewed the      importance of cardiac rehab and she will enroll in outpatient      cardiac rehab.  I advised that she can return to work.  She works      at a [employer] and I have advised against any heavy lifting or      physically strenuous activity but she will be able to work the      [employer] and do light-level activities.  I would like her to  continue dual antiplatelet therapy for a minimum of 12 months and      preferably 2 years if tolerating well.  See below for adjustment of      antihypertensive medications.  2. Dyslipidemia.  Will continue Lipitor 80 mg.  Recheck lipids at her      followup visit in 6 weeks.  3. Hypertension.  Blood pressure is elevated today.  She is only on      low-dose captopril and Coreg.  I have taken the liberty of doubling      her Coreg dose to 6.25 mg twice daily and I have also changed her      ACE inhibitor to lisinopril 10 mg daily so that she has a once-      daily ACE inhibitor.  4. Followup.  I would like to see Jennifer Gross back in 6 weeks with a      followup echocardiogram, metabolic panel and lipids.     Veverly Fells. Excell Seltzer, MD  Electronically Signed    MDC/MedQ  DD: 11/09/2007  DT: 11/10/2007  Job #: [id number]   cc:   Valetta Mole. Swords, MD

## 2011-05-07 NOTE — Assessment & Plan Note (Signed)
[facility]                            CARDIOLOGY OFFICE NOTE   ARDYN, FORGE                      MRN:          [id number]  DATE:10/19/2008                            DOB:          30-Dec-1940    REASON FOR VISIT:  CAD status post MI.   HISTORY OF PRESENT ILLNESS:  Jennifer Gross is a 70 year old woman who  had an anterior MI, 1 year ago.  She underwent stenting of an occluded  LAD with a drug-eluting stent.  She had an excellent recovery and her LV  function has returned to normal.  She denies chest pain, dyspnea, edema,  orthopnea, PND, lightheadedness, palpitations, or syncope.  She is not  engaged in formal exercise, but she remains active.  She has had some  trouble with gastroesophageal reflux and is planning on having upper  endoscopy as well as screening colonoscopy in the near future.  She has  been awaiting completion of 1 year of her dual antiplatelet therapy with  aspirin and Plavix.   MEDICATIONS:  1. Plavix 75 mg daily.  2. Lipitor 80 mg at bedtime.  3. Aspirin 81 mg daily.  4. Coreg 12.5 mg twice daily.  5. Lisinopril 20 mg daily.  6. Omeprazole 20 mg daily.  7. Famotidine 20 mg at bedtime.  8. Fluticasone 2 sprays each nostril daily.   ALLERGIES:  NKDA.   PHYSICAL EXAMINATION:  GENERAL:  The patient is alert and oriented.  She  is in no acute distress.  VITAL SIGNS:  Weight is 149 pounds, blood pressure is 158/80, heart rate  64, and respiratory rate is 16.  HEENT:  Normal.  NECK:  Normal carotid upstrokes.  No bruits.  JVP normal.  LUNGS:  Clear bilaterally.  HEART:  Regular rate and rhythm.  No murmurs or gallops.  ABDOMEN:  Soft, nontender.  No organomegaly.  EXTREMITIES:  No clubbing, cyanosis, or edema.  Peripheral pulses intact  and equal.   EKG normal sinus rhythm with a nonspecific ST and T-wave abnormality.   ASSESSMENT:  1. Coronary artery disease status post anterolateral myocardial      infarction.   No interval symptoms since her last visit 6 months      ago.  I am very pleased with her clinical course.  I would like her      to complete 2 years of dual antiplatelet therapy with aspirin and      Plavix and she is tolerating that so well.  I am fine with her      undergoing endoscopy and colonoscopy and holding Plavix  for a 5-7      day window so that procedure can be completed.  I would like her to      continue baby aspirin throughout this time period.  2. Hypertension.  Blood pressure has been elevated in her last few      office visits.  At the time of her last visit with Dr. Cato Mulligan in      July, her blood pressure was 120/80.  I have asked her to check  some home blood pressures so that we can have a better sense of      whether white coat hypertension may be playing a role.  If she has      elevated blood pressure at home, I would recommend increasing her      Coreg to 25 mg twice daily.  She will call if her systolic readings      are over 140.  3. Hyperlipidemia.  Due for lipids and LFTs in December.  4. Followup.  I would like to see her back in followup in 6 months.     Veverly Fells. Excell Seltzer, MD  Electronically Signed    MDC/MedQ  DD: 10/19/2008  DT: 10/20/2008  Job #: [id number]   cc:   Valetta Mole. Cato Mulligan, MD  Vania Rea. Jarold Motto, MD, Caleen Essex, FAGA

## 2011-05-07 NOTE — Assessment & Plan Note (Signed)
[name]                            CARDIOLOGY OFFICE NOTE   Jennifer, [name]                      MRN:          [id number]  DATE:03/14/2008                            DOB:          09/28/1941    PRIMARY MEDICAL DOCTOR:  Valetta Mole. Swords, MD   HISTORY OF PRESENT ILLNESS:  Jennifer Gross is a delightful 70 year old  woman with coronary artery disease.  She presented with an acute  anterolateral MI in October 2008 and underwent stenting of the totally  occluded LAD with a drug-eluting stent.  She continues to do quite well  from a symptomatic standpoint.  She denies any chest pain, dyspnea,  orthopnea, PND, edema or exercise intolerance.  She has been quite  active with work and her regular activities.  She is not engaged in  formal exercise.  She is tolerating her medications well.   CURRENT MEDICATIONS:  1. Aspirin 325 mg daily.  2. Plavix 75 mg daily.  3. Lipitor 80 mg bedtime.  4. Lisinopril 10 mg daily.  5. Coreg 6.25 mg twice daily.   ALLERGIES:  NKDA.   PHYSICAL EXAMINATION:  GENERAL:  The patient is alert and oriented.  She  is in no acute distress.  VITAL SIGNS:  Weights 151, blood pressure 146/78, heart rate 72,  respiratory rate 16.  HEENT:  Normal.  NECK:  Normal carotid upstrokes without bruits.  Jugular venous pressure  normal.  LUNGS:  Clear bilaterally.  HEART:  Regular rate and rhythm without murmurs or gallops.  ABDOMEN:  Soft, nontender no organomegaly.  No bruits.  EXTREMITIES:  No clubbing, cyanosis or edema.  Peripheral pulses 2+ and  equal throughout.   STUDIES REVIEWED:  Lipids from December 22 showed cholesterol 137,  triglycerides 62, HDL 51, LDL 74.  LFTs were normal.   Echo from December 22 showed normal left ventricular size and preserved  LV ejection fraction with a small area of apical hypokinesis.   ASSESSMENT:  1. Coronary artery disease status post anterior MI.  [name] is      doing well with  medical therapy.  She has had recovery of LV      function.  She is asymptomatic with regular activities.  I have      asked her to reduce her aspirin dose from 325 mg to 81 mg daily in      the presence of ongoing use of Plavix.  She will remain on aspirin      and Plavix for a minimum of one year, and I would prefer 2 years of      therapy if she continues to tolerate Plavix well.  I have asked her      to increase her Coreg from 6.25 mg to 12.5 mg b.i.d..  2. Hypertension as above, continue lisinopril and increase Coreg.  3. Dyslipidemia.  Remains on Lipitor 80 mg.  Will repeat LFTs in 3      months to make sure that we are not running into trouble with a      pattern toxicity.  If this is okay,  then will repeat lipids and      LFTs on an annual basis.   FOLLOWUP:  For followup, I would like to see [name] back in 6  months.  She continues to follow regularly with Dr. Cato Mulligan.  I spoke  with her about TRA-2P study.  She would like to think about this prior  to committing, and she will contact us if she desires to participate in  the study.     Veverly Fells. Excell Seltzer, MD  Electronically Signed    MDC/MedQ  DD: 03/14/2008  DT: 03/14/2008  Job #: [id number]   cc:   Valetta Mole. Swords, MD

## 2011-05-07 NOTE — Assessment & Plan Note (Signed)
[facility]                            CARDIOLOGY OFFICE NOTE   ONDREA, DOW                      MRN:          [id number]  DATE:12/14/2007                            DOB:          1941/01/12    PRIMARY CARE PHYSICIAN:  Birdie Sons.   REASON FOR VISIT:  Followup after acute MI.   HISTORY OF PRESENT ILLNESS:  Ms. Brinton is a delightful 70 year old  woman who had an anterolateral MI on October 30.  She underwent stenting  of the LAD with a drug-eluting stent as well as balloon angioplasty of  the first diagonal branch.  Ms. Rena is doing very well overall.  She  is having minimal symptoms at this point.  She has had some fleeting  pains under the rib cage that are nonexertional, she thinks this is her  indigestion.  She has had no exertional symptoms.  She has been active  with work at a [employer] as well as doing some walking.  She has had  no chest pain, dyspnea, orthopnea, PND, edema or palpitations,  lightheadedness or syncope.  She is tolerating her medications well  without complaints at this point.   CURRENT MEDICINES:  1. Aspirin 325 mg daily.  2. Plavix 75 mg daily.  3. Carvedilol 6.25 mg twice daily.  4. Lipitor 80 mg at bedtime.  5. Lisinopril 10 mg daily.   ALLERGIES:  NKDA.   EXAMINATION:  Patient is alert and oriented, she is in no acute  distress.  Weight is 152, blood pressure 136/80, heart rate 72,  respiratory rate 16.  HEENT:  Normal.  NECK:  Normal carotid upstrokes without bruits, jugular venous pressure  pressure.  LUNGS:  Clear to auscultation bilaterally.  HEART:  Regular rate and rhythm without murmurs or gallops.  ABDOMEN:  Soft, nontender no organomegaly, no bruits.  EXTREMITIES:  No syn, clubbing or edema.  Peripheral pulses 2+ and equal  throughout.   An echocardiogram was done in the office today and study has not been  officially interpreted, but on my review the LV function appears normal.  I would estimate the LVEF at 60% - 65%.  I did not appreciate any  regional wall motion abnormalities.   ASSESSMENT:  1. Coronary artery disease status post anterior myocardial infarction.      Ms. Kimbler has had excellent recovery of her left ventricular      function.  She appears to have normal left ventricular systolic      function on her echocardiogram today.  I would like her to continue      on her current medical therapy with aspirin, Plavix, Coreg,      lisinopril and Lipitor.  I will plan on reducing her aspirin dose      to 81 mg when she is 6 months out from her myocardial infarction,      otherwise we will continue her current therapy.  2. Dyslipidemia.  Continue Lipitor 80 mg.  Lipids were checked in the      office as well as liver function tests.  We will review  as soon as      they are available.   For followup I would like to see Ms. Macha back in 3 months.  If she  continues to be stable at that time I will schedule her out to every 6  months.  She is going to see Dr. Cato Mulligan next month.  I encouraged her  regarding her activity and exercise.  I have asked her to consider  cardiac rehab again because I think she would benefit from a psychologic  standpoint.     Veverly Fells. Excell Seltzer, MD  Electronically Signed    MDC/MedQ  DD: 12/14/2007  DT: 12/14/2007  Job #: [id number]   cc:   Valetta Mole. Swords, MD

## 2011-05-07 NOTE — Discharge Summary (Signed)
NAMENESTA, SCATURRO NO.:  [id number]   MEDICAL RECORD NO.:  [id number]          PATIENT TYPE:  INP   LOCATION:  2904                         FACILITY:  [facility]   PHYSICIAN:  Jennifer Codding, MD,FACC DATE OF BIRTH:  December 08, 1941   DATE OF ADMISSION:  10/22/2007  DATE OF DISCHARGE:  10/25/2007                               DISCHARGE SUMMARY   PRIMARY CARDIOLOGIST:  Dr. Calton Dach.   PRIMARY CARE PHYSICIAN:  Dr. Birdie Sons.   DISCHARGING DIAGNOSES:  1. Coronary artery disease, ST elevated myocardial infarction this      admission status post cardiac catheterization with drug-eluting      stents x2 placed in the LAD, PTCA of diagonal #1, nonobstructive      left circumflex, moderate right coronary artery stenosis, moderate      segmental left ventricular dysfunction with EF 45%.  2. Ischemic cardiomyopathy new diagnosis.  3. Hypercholesteremia.  4. Nonsustained ventricular tachycardia.   PAST MEDICAL HISTORY:  Seasonal allergies.   HOSPITAL COURSE:  Jennifer Gross is a very pleasant 70 year old Caucasian  female with no previous medical history.  Primary care is followed by  Dr. Cato Mulligan.  Day of admission had just eaten breakfast and gone to work  and experienced sudden onset of chest discomfort very intense.  EMS was  notified.  The patient given sublingual nitroglycerin, aspirin, and  morphine; EKG with ST elevated MI.  The patient taken urgently to the  cath lab on arrival.  The patient intervened on by Dr. Calton Dach as  stated above.  The patient tolerated the procedure without  complications.  Chest pain resolved.  The patient did experience some  nausea and vomiting; improved with medication.  Some brief runs of  nonsustained  V-tach; otherwise, stable.  Troponin peaked at 27.41.  The patient  continued to do well.  Cardiac rehab in to initiate education.  The  patient ambulating without chest pain.  Patient being discharged home  post MI day #3 following  up with Dr. Excell Seltzer in office setting.  The  patient will need Plavix for at least 12 months.  Will continue beta  blocker, statin, ACE inhibitor.  At time of discharge patient afebrile,  blood pressure 118/71, heart rate 75, sat 93% on room air.   MEDICATIONS AT TIME OF DISCHARGE:  Note patient has been given a  prescription for all the following medications except for aspirin  enteric-coated 325, Plavix 75 daily, nitroglycerin 0.4 sublingual  p.r.n., carvedilol 3.125 b.i.d., Lipitor 80 p.o. q.h.s., captopril 6.25  mg p.o. t.i.d., okay to continue Claritin for her allergies.   The patient has been given the post cardiac catheterization discharge  instructions, the post myocardial infarction discharge instructions, and  knows to call our office for any problems from her catheterization site.  Our office will call the patient tomorrow with a follow-up appointment.  The patient is instructed not to drive or return to work until she  follows up with Dr. Excell Seltzer.  Duration of discharge encounter greater  than 30 minutes.      Jennifer Gross, ACNP  Jennifer Codding, MD,FACC  Electronically Signed    Jennifer Gross  D:  10/25/2007  T:  10/26/2007  Job:  [id number]   cc:   Valetta Mole. Swords, MD

## 2011-05-07 NOTE — Cardiovascular Report (Signed)
Jennifer Gross, Jennifer Gross NO.:  [id number]   MEDICAL RECORD NO.:  [id number]          PATIENT TYPE:  INP   LOCATION:  2807                         FACILITY:  [facility]   PHYSICIAN:  Veverly Fells. Excell Seltzer, MD  DATE OF BIRTH:  August 18, 1941   DATE OF PROCEDURE:  10/22/2007  DATE OF DISCHARGE:                            CARDIAC CATHETERIZATION   PROCEDURE:  1. Left heart catheterization.  2. Selective coronary angiography.  3. Left ventricular angiography.  4. Percutaneous transluminal coronary angioplasty and drug-eluting      stent placement in the left anterior descending, percutaneous      transluminal coronary angioplasty of the first diagonal with final      kissing balloon angioplasty of the left anterior descending      diagonal bifurcation, intravascular ultrasound of the left anterior      descending and StarClose of the right femoral artery.   INDICATIONS:  Jennifer Gross is a 70 year old woman with no prior cardiac  history.  She developed substernal chest pain this morning and called  EMS.  On their arrival she had anterolateral ST elevation in a code  stemi was called.  She was brought directly to the cardiac cath lab.  With ongoing ST elevation and residual chest pain, we performed urgent  cardiac catheterization.   Risks and indications of procedure were reviewed with the patient in  detail.  Informed consent was obtained.  The right groin was prepped,  draped, anesthetized with 1% lidocaine using modified Seldinger  technique.  A 6-French sheath was placed in the right femoral artery.  Standard 6-French Judkins catheters were used for selective coronary  angiography.  Following the diagnostic procedure, I elected to intervene  on the LAD.  The LAD was totally occluded in its proximal aspect.  Angiomax was used for anticoagulation.  The patient was preloaded with  600 mg of clopidogrel on the table.  A 6-French [id number] guide catheter was  inserted.  Once a  therapeutic ACT was achieved, a cougar guidewire was  passed easily beyond the lesion into the first diagonal branch of the  LAD.  With the wire crossing, reperfusion occurred and flow was  restored.  There was a long, severely diseased stent segment in the  proximal to mid LAD involving the bifurcation of a large diagonal  branch.  At that point I elected to place a second wire.  A BMW  guidewire was passed fairly easily into the distal LAD.  The long  segment of disease was then dilated with a 2.5 x 15-mm Maverick balloon.  Multiple inflations to 6 atmospheres were performed.  Of note, the  disease crossed the diagonal ostium.  I then elected to stent from  distal to the diagonal, back across until we reached normal vessel.  A  2.5 x 28 mm Promo stent was used for the distal part of the lesion.  It  was deployed at 12 atmospheres and well expanded.  A second stent was  then placed in overlapping fashion to treat the remainder of disease.  A  2.75 x 28 mm Promus was used.  Following stenting, there was only  TIMI-  2 flow in the LAD and TIMI-1 flow in the diagonal branch.  There was  severe compromise of the diagonal ostium.  Integrilin was added and  multiple doses of intracoronary adenosine were given.  I elected to  rewire the diagonal.  This was wired with the same cougar wire.  It  wired fairly easily.  I then performed a kissing balloon angioplasty of  the diagonal LAD bifurcation with a 2-0 x 20-mm balloon in the diagonal  branch and in a 2.5 x 15-mm balloon in the LAD branch.  Initially the  LAD balloon was taken to 4 atmospheres and the diagonal balloon taken to  6 atmospheres.  A second inflation was done with the LAD balloon taken  to 6 atmospheres and the diagonal balloon taken to 4 atmospheres.  Angiography after kissing balloons demonstrated TIMI-3 flow in both  vessels with improvement from 99% to 40% stenosis in the diagonal ostium  with a long segment of stent in the LAD.   I elected to perform post  stent IVUS.  An IVUS was placed out into the distal LAD and an automated  pullback was performed.  The IVUS demonstrated excellent stent expansion  in the proximal aspect of the stent as well as across the diagonal  origin.  However, distal to the diagonal branch, there was poor stent  expansion.  The stent was well opposed of the vessel wall throughout.  Based on the IVUS findings, I elected to post dilate the distal portion  of stent with a 2.75 x 12-mm Quantum Maverick.  A total of three  inflations were done.  The inflations were taken to 16 atmospheres  distally and 18 atmospheres in the mid portion of the stent.  At that  point, final angiography demonstrated excellent flow in both vessels.  The diagonal origin has a residual 40% stenosis but there is TIMI-3 flow  in that vessel.  The LAD is widely patent.  I thought that this was an  optimal angiographic result and we could not improve on that any  further.  At that point a pigtail catheter was inserted into the left  ventricle where pressures were recorded.  The left ventriculogram was  performed.  A pullback across the aortic valve was done.  Finally, a  StarClose device was used to seal the femoral arteriotomy.  The patient  tolerated procedure very well.  She had no immediate complications.   FINDINGS:  Aortic pressure 107/63 with mean of 83, left ventricular  pressure 105/24.   CORONARY ANGIOGRAPHY:  The left mainstem is angiographically normal.  It  bifurcates into the LAD and left circumflex.   The LAD is a large-caliber vessel.  There are luminal irregularities  proximally.  The LAD is totally occluded at the first septal perforator.  There is a proximal diagonal branch that is fairly small arising from  the proximal portion of the LAD.   The left circumflex is of medium caliber.  There are minor luminal  irregularities throughout the proximal portion.  There are two small  marginal branches  present.  The second marginal is the largest of the  two.  The circumflex courses down and then supplies a left  posterolateral branch as well.  There are no high-grade stenoses  throughout the circumflex.   The right coronary artery is dominant.  It is of medium to large  caliber.  In the proximal aspect of the right coronary artery,  there is  a smooth 70% stenosis present.  There is no other significant disease in  the right coronary artery.  There are there is a PDA branch and three  posterolateral branches.  The first two of which are medium caliber.  There is also an RV marginal branch present.  Left ventriculography  demonstrates apical akinesis, but relatively well preserved LV function  with an LVEF estimated at 45%.   ASSESSMENT:  1. Anterolateral ST elevation myocardial infarction, with successful      percutaneous coronary intervention of the left anterior descending      diagonal bifurcation.  2. Normal left circumflex.  3. Moderate right coronary artery stenosis.  4. Mild to moderate segmental left ventricular dysfunction.   PLAN:  Mrs. Berwanger will be admitted to the CCU.  She should remain on  aspirin and clopidogrel for a bare minimum of 12 months.  If she has an  uneventful recovery, would favor an outpatient Myoview to assess her  right coronary artery territory.      Veverly Fells. Excell Seltzer, MD  Electronically Signed    MDC/MEDQ  D:  10/22/2007  T:  10/23/2007  Job:  [id number]   cc:   Valetta Mole. Swords, MD

## 2011-06-03 ENCOUNTER — Other Ambulatory Visit: Payer: Self-pay | Admitting: Internal Medicine

## 2011-08-07 ENCOUNTER — Encounter: Payer: Self-pay | Admitting: Internal Medicine

## 2011-08-07 ENCOUNTER — Ambulatory Visit (INDEPENDENT_AMBULATORY_CARE_PROVIDER_SITE_OTHER): Payer: Medicare Other | Admitting: Internal Medicine

## 2011-08-07 VITALS — BP 132/78 | HR 76 | Temp 98.1°F | Wt 160.0 lb

## 2011-08-07 DIAGNOSIS — I2589 Other forms of chronic ischemic heart disease: Secondary | ICD-10-CM

## 2011-08-07 DIAGNOSIS — K219 Gastro-esophageal reflux disease without esophagitis: Secondary | ICD-10-CM

## 2011-08-07 DIAGNOSIS — I10 Essential (primary) hypertension: Secondary | ICD-10-CM

## 2011-08-07 DIAGNOSIS — I2510 Atherosclerotic heart disease of native coronary artery without angina pectoris: Secondary | ICD-10-CM

## 2011-08-07 MED ORDER — ATORVASTATIN CALCIUM 80 MG PO TABS: ORAL_TABLET | ORAL | Status: DC

## 2011-08-07 NOTE — Progress Notes (Signed)
  Subjective:    Patient ID: Jennifer Gross, female    DOB: October 12, 1941, 70 y.o.   MRN: [id number]  HPI  Patient Active Problem List  Diagnoses  . HYPERLIPIDEMIA---tolerating meds  . HYPERTENSION---no sxs and tolerating meds  .   . CAD, NATIVE VESSEL---no CP, SOB  . ISCHEMIC CARDIOMYOPATHY---no s/s of CHF  .   Marland Kitchen GERD--tolerating meds. Occasionally has dysphagia.    Past Medical History  Diagnosis Date  . CAD (coronary artery disease) 2008    status post anterior myocardial infarction, treated w/ a dru-eluting stent to the LAD  . GERD (gastroesophageal reflux disease)   . Hypertension   . Hyperlipidemia   . Allergy     Rhinitis  . ALLERGIC RHINITIS 04/08/2008  . CAD, NATIVE VESSEL 11/10/2009  . GERD 05/05/2008  . HYPERLIPIDEMIA 03/29/2008  . HYPERTENSION 03/29/2008   Past Surgical History  Procedure Date  . Percutaneous transluminal coronary angioplasty and drug-eluting 09/2007    Stent placement in the left anterior descending  . Appendectomy     reports that she has never smoked. She does not have any smokeless tobacco history on file. She reports that she does not drink alcohol or use illicit drugs. family history includes Coronary artery disease in her maternal uncle and mother; Heart disease in her brother and mother; and Lung cancer in her father. Allergies  Allergen Reactions  . Penicillins     REACTION: rash     Review of Systems  patient denies chest pain, shortness of breath, orthopnea. Denies lower extremity edema, abdominal pain, change in appetite, change in bowel movements. Patient denies rashes, musculoskeletal complaints. No other specific complaints in a complete review of systems.     Objective:   Physical Exam    Well-developed well-nourished female in no acute distress. HEENT exam atraumatic, normocephalic, extraocular muscles are intact. Neck is supple. No jugular venous distention no thyromegaly. Chest clear to auscultation without increased work of  breathing. Cardiac exam S1 and S2 are regular. Abdominal exam active bowel sounds, soft, nontender. Extremities no edema. Neurologic exam she is alert without any motor sensory deficits. Gait is normal.       Assessment & Plan:

## 2011-08-15 NOTE — Assessment & Plan Note (Signed)
No symptoms. Continue current medications. 

## 2011-08-15 NOTE — Assessment & Plan Note (Signed)
No signs or symptoms of congestive heart failure. Continue current medications.

## 2011-08-15 NOTE — Assessment & Plan Note (Signed)
BP Readings from Last 3 Encounters:  08/07/11 132/78  02/06/11 142/88  11/19/10 134/72   Adequate control. Continue current medications.

## 2011-09-27 ENCOUNTER — Ambulatory Visit (INDEPENDENT_AMBULATORY_CARE_PROVIDER_SITE_OTHER): Payer: Medicare Other

## 2011-09-27 DIAGNOSIS — Z23 Encounter for immunization: Secondary | ICD-10-CM

## 2011-10-01 LAB — BASIC METABOLIC PANEL
CO2: 31
Calcium: 8.8
GFR calc non Af Amer: >60
Glucose, Bld: 91

## 2011-10-01 LAB — CBC
Hemoglobin: 12
MCHC: 33
RBC: 4.11

## 2011-10-01 LAB — B-NATRIURETIC PEPTIDE (CONVERTED LAB): Pro B Natriuretic peptide (BNP): 492 — ABNORMAL HIGH

## 2011-10-01 LAB — CK TOTAL AND CKMB (NOT AT ARMC)
CK, MB: 18.9 — ABNORMAL HIGH
Relative Index: 4.6 — ABNORMAL HIGH

## 2011-10-02 LAB — I-STAT EC8
BUN: 18
Bicarbonate: 26.2 — ABNORMAL HIGH
Glucose, Bld: 155 — ABNORMAL HIGH
Operator id: 135831
Sodium: 140
TCO2: 28
pCO2 arterial: 46.9 — ABNORMAL HIGH

## 2011-10-02 LAB — LIPID PANEL
Cholesterol: 207 — ABNORMAL HIGH
HDL: 46
HDL: 48
LDL Cholesterol: 129 — ABNORMAL HIGH
LDL Cholesterol: 129 — ABNORMAL HIGH
Total CHOL/HDL Ratio: 4.5
Triglycerides: 86
VLDL: 17

## 2011-10-02 LAB — CARDIAC PANEL(CRET KIN+CKTOT+MB+TROPI)
CK, MB: 148.9 — ABNORMAL HIGH
CK, MB: 65.7 — ABNORMAL HIGH
Relative Index: 11.2 — ABNORMAL HIGH
Relative Index: 18.2 — ABNORMAL HIGH
Total CK: 1325 — ABNORMAL HIGH
Total CK: 440 — ABNORMAL HIGH
Troponin I: 27.41
Troponin I: 3.4

## 2011-10-02 LAB — BASIC METABOLIC PANEL
BUN: 14
Chloride: 107
Potassium: 4.2
Sodium: 140

## 2011-10-02 LAB — APTT: aPTT: 27

## 2011-10-02 LAB — CBC
HCT: 35.5 — ABNORMAL LOW
Platelets: 256
Platelets: 294
RDW: 13.8
RDW: 14.7 — ABNORMAL HIGH
WBC: 12.8 — ABNORMAL HIGH

## 2011-10-02 LAB — HEPATIC FUNCTION PANEL
ALT: 29
AST: 120 — ABNORMAL HIGH
Albumin: 2.9 — ABNORMAL LOW
Total Protein: 5.5 — ABNORMAL LOW

## 2011-10-02 LAB — CK TOTAL AND CKMB (NOT AT ARMC)
CK, MB: 2.9
Relative Index: 2.8 — ABNORMAL HIGH

## 2011-10-02 LAB — DIFFERENTIAL
Basophils Absolute: 0.1
Lymphocytes Relative: 28
Monocytes Absolute: 0.6
Monocytes Relative: 5
Neutro Abs: 8.3 — ABNORMAL HIGH

## 2011-10-02 LAB — POCT I-STAT CREATININE
Creatinine, Ser: 1.2
Operator id: 135831

## 2011-10-02 LAB — COMPREHENSIVE METABOLIC PANEL
Albumin: 3.5
BUN: 16
Creatinine, Ser: 1.01
Total Protein: 6.3

## 2011-10-02 LAB — PLATELET COUNT: Platelets: 269

## 2011-10-25 ENCOUNTER — Encounter: Payer: Self-pay | Admitting: Cardiovascular Disease

## 2011-10-25 ENCOUNTER — Ambulatory Visit (INDEPENDENT_AMBULATORY_CARE_PROVIDER_SITE_OTHER): Payer: Medicare Other | Admitting: Cardiovascular Disease

## 2011-10-25 DIAGNOSIS — I2510 Atherosclerotic heart disease of native coronary artery without angina pectoris: Secondary | ICD-10-CM

## 2011-10-25 DIAGNOSIS — I10 Essential (primary) hypertension: Secondary | ICD-10-CM

## 2011-10-25 DIAGNOSIS — E785 Hyperlipidemia, unspecified: Secondary | ICD-10-CM

## 2011-10-25 MED ORDER — LOSARTAN POTASSIUM 100 MG PO TABS: 100.0000 mg | ORAL_TABLET | Freq: Every day | ORAL | Status: DC

## 2011-10-25 MED ORDER — CLOPIDOGREL BISULFATE 75 MG PO TABS: 75.0000 mg | ORAL_TABLET | Freq: Every day | ORAL | Status: DC

## 2011-10-25 NOTE — Assessment & Plan Note (Signed)
The patient is stable without angina. She has normal LV function. She has excellent control of her cardiovascular risk factors with well treated hypertension and hyperlipidemia. She is maintained on long-term dual antiplatelet therapy with aspirin and Plavix and has not had any bleeding problems. We discussed the interaction between omeprazole and Plavix today. She only takes omeprazole intermittently. I offered her a prescription for Protonix but she spells me that this has not worked for her in the past. She is 4 years out from her MI and I think the Plavix/omeprazole interaction with only p.r.n. use of omeprazole is probably not clinically significant. She will review this with Dr. Cato Mulligan when she sees him in February and consider a change in PPI or further GI evaluation at that time.

## 2011-10-25 NOTE — Progress Notes (Signed)
HPI:  This is a 70 year old woman presenting for followup evaluation. She presented in 2008 with a anterolateral infarction. The patient had complex coronary disease and underwent stenting of the LAD with overlapping drug-eluting stents. Her diagonal ostium was compromised and balloon angioplasty of the diagonal was performed. The procedure was done under guidance of intravascular ultrasound. The patient's left ventricular function returned to normal and followup echocardiography. She has been maintained on long-term dual antiplatelet therapy with aspirin and Plavix. She presents today for followup.  The patient is doing well. She denies exertional chest pain or tightness. She denies dyspnea, edema, or palpitations. She's been having some increased problems with her as reflux symptoms. She takes omeprazole on an as needed basis.  Outpatient Encounter Prescriptions as of 10/25/2011  Medication Sig Dispense Refill  . aspirin 81 MG tablet Take 81 mg by mouth daily.        Marland Kitchen atorvastatin (LIPITOR) 80 MG tablet 1 by mouth daily or as directed  90 tablet  3  . carvedilol (COREG) 25 MG tablet Take 1 tablet (25 mg total) by mouth 2 (two) times daily.  60 tablet  5  . clopidogrel (PLAVIX) 75 MG tablet Take 75 mg by mouth daily.        Marland Kitchen losartan (COZAAR) 100 MG tablet TAKE ONE TABLET BY MOUTH EVERY DAY  30 tablet  5  . nitroGLYCERIN (NITROSTAT) 0.3 MG SL tablet Place 0.3 mg under the tongue every 5 (five) minutes as needed.        Marland Kitchen omeprazole (PRILOSEC) 20 MG capsule TAKE ONE CAPSULE BY MOUTH EVERY DAY  90 capsule  1    Allergies  Allergen Reactions  . Penicillins     REACTION: rash    Past Medical History  Diagnosis Date  . CAD (coronary artery disease) 2008    status post anterior myocardial infarction, treated w/ a dru-eluting stent to the LAD  . GERD (gastroesophageal reflux disease)   . Hypertension   . Hyperlipidemia   . Allergy     Rhinitis  . ALLERGIC RHINITIS 04/08/2008  . CAD, NATIVE  VESSEL 11/10/2009  . GERD 05/05/2008  . HYPERLIPIDEMIA 03/29/2008  . HYPERTENSION 03/29/2008    ROS: Negative except as per HPI  BP 128/68  Pulse 61  Ht 5\' 6"  (1.676 m)  Wt 159 lb (72.122 kg)  BMI 25.66 kg/m2  PHYSICAL EXAM: Pt is alert and oriented, NAD HEENT: normal Neck: JVP - normal, carotids 2+= without bruits Lungs: CTA bilaterally CV: RRR without murmur or gallop Abd: soft, NT, Positive BS, no hepatomegaly Ext: no C/C/E, distal pulses intact and equal Skin: warm/dry no rash  EKG:  Normal sinus rhythm 61 beats per minute  ASSESSMENT AND PLAN:

## 2011-10-25 NOTE — Assessment & Plan Note (Signed)
Lipids have been at goal on atorvastatin.

## 2011-10-25 NOTE — Patient Instructions (Signed)
Your physician wants you to follow-up in: 12 months.  You will receive a reminder letter in the mail two months in advance. If you don't receive a letter, please call our office to schedule the follow-up appointment.  Your physician recommends that you continue on your current medications as directed. Please refer to the Current Medication list given to you today.   

## 2011-10-25 NOTE — Assessment & Plan Note (Signed)
Blood pressure is at goal on losartan and carvedilol.

## 2012-01-29 ENCOUNTER — Other Ambulatory Visit (INDEPENDENT_AMBULATORY_CARE_PROVIDER_SITE_OTHER): Payer: Medicare Other

## 2012-01-29 DIAGNOSIS — I2510 Atherosclerotic heart disease of native coronary artery without angina pectoris: Secondary | ICD-10-CM

## 2012-01-29 LAB — LIPID PANEL
HDL: 56.6 mg/dL (ref >39.00)
Total CHOL/HDL Ratio: 3
Triglycerides: 74 mg/dL (ref 0.0–149.0)

## 2012-01-29 LAB — BASIC METABOLIC PANEL
Calcium: 9.3 mg/dL (ref 8.4–10.5)
Creatinine, Ser: 0.8 mg/dL (ref 0.4–1.2)
GFR: 72.96 mL/min (ref >60.00)
Sodium: 143 mEq/L (ref 135–145)

## 2012-01-29 LAB — CBC WITH DIFFERENTIAL/PLATELET
Basophils Absolute: 0 10*3/uL (ref 0.0–0.1)
Basophils Relative: 0.2 % (ref 0.0–3.0)
HCT: 39.8 % (ref 36.0–46.0)
Hemoglobin: 13.3 g/dL (ref 12.0–15.0)
Lymphs Abs: 2.1 10*3/uL (ref 0.7–4.0)
Monocytes Relative: 5.6 % (ref 3.0–12.0)
Neutro Abs: 7.5 10*3/uL (ref 1.4–7.7)
RBC: 4.3 Mil/uL (ref 3.87–5.11)
RDW: 14.3 % (ref 11.5–14.6)

## 2012-01-29 LAB — HEPATIC FUNCTION PANEL
Albumin: 3.9 g/dL (ref 3.5–5.2)
Alkaline Phosphatase: 92 U/L (ref 39–117)
Bilirubin, Direct: 0 mg/dL (ref 0.0–0.3)

## 2012-02-05 ENCOUNTER — Ambulatory Visit (INDEPENDENT_AMBULATORY_CARE_PROVIDER_SITE_OTHER): Payer: Medicare Other | Admitting: Internal Medicine

## 2012-02-05 ENCOUNTER — Encounter: Payer: Self-pay | Admitting: Internal Medicine

## 2012-02-05 DIAGNOSIS — I10 Essential (primary) hypertension: Secondary | ICD-10-CM

## 2012-02-05 DIAGNOSIS — I2510 Atherosclerotic heart disease of native coronary artery without angina pectoris: Secondary | ICD-10-CM

## 2012-02-05 DIAGNOSIS — E785 Hyperlipidemia, unspecified: Secondary | ICD-10-CM

## 2012-02-05 NOTE — Assessment & Plan Note (Signed)
Reviewed labs- Good control, continue meds

## 2012-02-05 NOTE — Assessment & Plan Note (Signed)
No sxs, continue risk factor modification 

## 2012-02-05 NOTE — Assessment & Plan Note (Signed)
Well controlled Continue meds 

## 2012-02-05 NOTE — Progress Notes (Signed)
Patient ID: Jennifer Gross, female   DOB: [birth year], 71 y.o.   MRN: [id number] Patient Active Problem List  Diagnoses  . HYPERLIPIDEMIA---tolerating meds  . HYPERTENSION--no home bps , tolerating meds  .   . CAD, NATIVE VESSEL---no sxs, reviewed cath 2008  .    Past Medical History  Diagnosis Date  . CAD (coronary artery disease) 2008    status post anterior myocardial infarction, treated w/ a dru-eluting stent to the LAD  . GERD (gastroesophageal reflux disease)   . Hypertension   . Hyperlipidemia   . Allergy     Rhinitis  . ALLERGIC RHINITIS 04/08/2008  . CAD, NATIVE VESSEL 11/10/2009  . GERD 05/05/2008  . HYPERLIPIDEMIA 03/29/2008  . HYPERTENSION 03/29/2008    History   Social History  . Marital Status: Married    Spouse Name: N/A    Number of Children: N/A  . Years of Education: N/A   Occupational History  . Not on file.   Social History Main Topics  . Smoking status: Never Smoker   . Smokeless tobacco: Not on file  . Alcohol Use: No  . Drug Use: No  . Sexually Active:    Other Topics Concern  . Not on file   Social History Narrative  . No narrative on file    Past Surgical History  Procedure Date  . Percutaneous transluminal coronary angioplasty and drug-eluting 09/2007    Stent placement in the left anterior descending  . Appendectomy     Family History  Problem Relation Age of Onset  . Coronary artery disease Mother   . Heart disease Mother   . Coronary artery disease Maternal Uncle   . Lung cancer Father   . Heart disease Brother     stent placed    Allergies  Allergen Reactions  . Penicillins     REACTION: rash    Current Outpatient Prescriptions on File Prior to Visit  Medication Sig Dispense Refill  . aspirin 81 MG tablet Take 81 mg by mouth daily.        Marland Kitchen atorvastatin (LIPITOR) 80 MG tablet 1 by mouth daily or as directed  90 tablet  3  . carvedilol (COREG) 25 MG tablet Take 1 tablet (25 mg total) by mouth 2 (two) times daily.  60  tablet  5  . clopidogrel (PLAVIX) 75 MG tablet Take 1 tablet (75 mg total) by mouth daily.  90 tablet  3  . losartan (COZAAR) 100 MG tablet Take 1 tablet (100 mg total) by mouth daily.  90 tablet  3  . nitroGLYCERIN (NITROSTAT) 0.3 MG SL tablet Place 0.3 mg under the tongue every 5 (five) minutes as needed.        Marland Kitchen omeprazole (PRILOSEC) 20 MG capsule TAKE ONE CAPSULE BY MOUTH EVERY DAY  90 capsule  1     patient denies chest pain, shortness of breath, orthopnea. Denies lower extremity edema, abdominal pain, change in appetite, change in bowel movements. Patient denies rashes, musculoskeletal complaints. No other specific complaints in a complete review of systems.   BP 126/80  Pulse 72  Temp(Src) 98.3 F (36.8 C) (Oral)  Wt 161 lb (73.029 kg) Past Medical History  Diagnosis Date  . CAD (coronary artery disease) 2008    status post anterior myocardial infarction, treated w/ a dru-eluting stent to the LAD  . GERD (gastroesophageal reflux disease)   . Hypertension   . Hyperlipidemia   . Allergy     Rhinitis  . ALLERGIC  RHINITIS 04/08/2008  . CAD, NATIVE VESSEL 11/10/2009  . GERD 05/05/2008  . HYPERLIPIDEMIA 03/29/2008  . HYPERTENSION 03/29/2008    History   Social History  . Marital Status: Married    Spouse Name: N/A    Number of Children: N/A  . Years of Education: N/A   Occupational History  . Not on file.   Social History Main Topics  . Smoking status: Never Smoker   . Smokeless tobacco: Not on file  . Alcohol Use: No  . Drug Use: No  . Sexually Active:    Other Topics Concern  . Not on file   Social History Narrative  . No narrative on file    Past Surgical History  Procedure Date  . Percutaneous transluminal coronary angioplasty and drug-eluting 09/2007    Stent placement in the left anterior descending  . Appendectomy     Family History  Problem Relation Age of Onset  . Coronary artery disease Mother   . Heart disease Mother   . Coronary artery disease  Maternal Uncle   . Lung cancer Father   . Heart disease Brother     stent placed    Allergies  Allergen Reactions  . Penicillins     REACTION: rash    Current Outpatient Prescriptions on File Prior to Visit  Medication Sig Dispense Refill  . aspirin 81 MG tablet Take 81 mg by mouth daily.        Marland Kitchen atorvastatin (LIPITOR) 80 MG tablet 1 by mouth daily or as directed  90 tablet  3  . carvedilol (COREG) 25 MG tablet Take 1 tablet (25 mg total) by mouth 2 (two) times daily.  60 tablet  5  . clopidogrel (PLAVIX) 75 MG tablet Take 1 tablet (75 mg total) by mouth daily.  90 tablet  3  . losartan (COZAAR) 100 MG tablet Take 1 tablet (100 mg total) by mouth daily.  90 tablet  3  . nitroGLYCERIN (NITROSTAT) 0.3 MG SL tablet Place 0.3 mg under the tongue every 5 (five) minutes as needed.        Marland Kitchen omeprazole (PRILOSEC) 20 MG capsule TAKE ONE CAPSULE BY MOUTH EVERY DAY  90 capsule  1     patient denies chest pain, shortness of breath, orthopnea. Denies lower extremity edema, abdominal pain, change in appetite, change in bowel movements. Patient denies rashes, musculoskeletal complaints. No other specific complaints in a complete review of systems.   BP 126/80  Pulse 72  Temp(Src) 98.3 F (36.8 C) (Oral)  Wt 161 lb (73.029 kg)  Well-developed well-nourished female in no acute distress. HEENT exam atraumatic, normocephalic, extraocular muscles are intact. Neck is supple. No jugular venous distention no thyromegaly. Chest clear to auscultation without increased work of breathing. Cardiac exam S1 and S2 are regular. Abdominal exam active bowel sounds, soft, nontender. Extremities no edema. Neurologic exam she is alert without any motor sensory deficits. Gait is normal.

## 2012-04-02 ENCOUNTER — Other Ambulatory Visit: Payer: Self-pay | Admitting: Internal Medicine

## 2012-04-16 ENCOUNTER — Telehealth: Payer: Self-pay | Admitting: *Deleted

## 2012-04-16 NOTE — Telephone Encounter (Addendum)
Son is calling and is very worried about his Mom being extremely depressed.  Advised him to let us give her an appt with someone right away.  He is going to talk to her and call us back for an appt.

## 2012-04-17 ENCOUNTER — Other Ambulatory Visit: Payer: Self-pay | Admitting: Internal Medicine

## 2012-04-20 NOTE — Telephone Encounter (Signed)
Dr. Timoteo Gaul notified.

## 2012-08-26 ENCOUNTER — Other Ambulatory Visit: Payer: Self-pay | Admitting: Internal Medicine

## 2012-10-13 ENCOUNTER — Ambulatory Visit (INDEPENDENT_AMBULATORY_CARE_PROVIDER_SITE_OTHER): Payer: Medicare Other

## 2012-10-13 DIAGNOSIS — Z23 Encounter for immunization: Secondary | ICD-10-CM

## 2012-10-27 ENCOUNTER — Ambulatory Visit: Payer: Medicare Other | Admitting: Cardiovascular Disease

## 2012-11-09 ENCOUNTER — Ambulatory Visit (INDEPENDENT_AMBULATORY_CARE_PROVIDER_SITE_OTHER): Payer: Medicare Other | Admitting: Cardiovascular Disease

## 2012-11-09 ENCOUNTER — Encounter: Payer: Self-pay | Admitting: Cardiovascular Disease

## 2012-11-09 VITALS — BP 155/80 | HR 66 | Resp 16 | Ht 65.0 in | Wt 156.0 lb

## 2012-11-09 DIAGNOSIS — I2510 Atherosclerotic heart disease of native coronary artery without angina pectoris: Secondary | ICD-10-CM

## 2012-11-09 NOTE — Patient Instructions (Addendum)
Your physician wants you to follow-up in: 1 YEAR with Dr Cooper.  You will receive a reminder letter in the mail two months in advance. If you don't receive a letter, please call our office to schedule the follow-up appointment.  Your physician recommends that you continue on your current medications as directed. Please refer to the Current Medication list given to you today.  

## 2012-11-09 NOTE — Progress Notes (Signed)
   HPI:  71 year-old woman presenting for followup evaluation. The patient has coronary artery disease and initially presented in 2008 with an anterior wall infarction. She was treated with overlapping drug-eluting stents in the LAD and balloon angioplasty of the diagonal branch. She's been maintained on dual antiplatelet therapy with aspirin and Plavix long term.  From a symptomatic perspective she is doing well. She denies chest pain, chest pressure, dyspnea, edema, or palpitations. Her acid reflux symptoms have been better and she takes omeprazole less than once per week.  Outpatient Encounter Prescriptions as of 11/09/2012  Medication Sig Dispense Refill  . aspirin 81 MG tablet Take 81 mg by mouth daily.        Marland Kitchen atorvastatin (LIPITOR) 80 MG tablet TAKE ONE TABLET BY MOUTH EVERY DAY OR AS DIRECTED  90 tablet  1  . carvedilol (COREG) 25 MG tablet TAKE ONE TABLET BY MOUTH TWICE DAILY  180 tablet  1  . clopidogrel (PLAVIX) 75 MG tablet Take 1 tablet (75 mg total) by mouth daily.  90 tablet  3  . losartan (COZAAR) 100 MG tablet Take 1 tablet (100 mg total) by mouth daily.  90 tablet  3  . nitroGLYCERIN (NITROSTAT) 0.3 MG SL tablet Place 0.3 mg under the tongue every 5 (five) minutes as needed.        Marland Kitchen omeprazole (PRILOSEC) 20 MG capsule TAKE ONE CAPSULE BY MOUTH EVERY DAY  90 capsule  1  . [DISCONTINUED] carvedilol (COREG) 25 MG tablet Take 1 tablet (25 mg total) by mouth 2 (two) times daily.  60 tablet  5    Allergies  Allergen Reactions  . Penicillins     REACTION: rash    Past Medical History  Diagnosis Date  . CAD (coronary artery disease) 2008    status post anterior myocardial infarction, treated w/ a dru-eluting stent to the LAD  . GERD (gastroesophageal reflux disease)   . Hypertension   . Hyperlipidemia   . Allergy     Rhinitis  . ALLERGIC RHINITIS 04/08/2008  . CAD, NATIVE VESSEL 11/10/2009  . GERD 05/05/2008  . HYPERLIPIDEMIA 03/29/2008  . HYPERTENSION 03/29/2008    ROS:  Negative except as per HPI  BP 155/80  Pulse 66  Resp 16  Ht 5\' 5"  (1.651 m)  Wt 70.761 kg (156 lb)  BMI 25.96 kg/m2  PHYSICAL EXAM: Pt is alert and oriented, NAD HEENT: normal Neck: JVP - normal, carotids 2+= without bruits Lungs: CTA bilaterally CV: RRR without murmur or gallop Abd: soft, NT, Positive BS, no hepatomegaly Ext: no C/C/E, distal pulses intact and equal Skin: warm/dry no rash  EKG:  Normal sinus rhythm 61 beats per minute, nonspecific ST and T wave abnormality. No significant change from previous tracings  ASSESSMENT AND PLAN: 1. Coronary artery disease, native vessel. The patient remained stable without anginal symptoms. Her EKG is essentially within normal limits. She will continue on aspirin and Plavix. Her medical program was reviewed and it is appropriate. I will see her back in 12 months.  2. Essential hypertension. Her home blood pressure readings are excellent with systolics less than 125 and diastolics in the 70s. She has white coat hypertension. She will continue on carvedilol and losartan.  3. Hyperlipidemia. Cholesterol is 148, triglycerides 74, HDL 57, LDL 77. She remains on high-dose atorvastatin and will continue this. Her liver function tests were normal.  Tonny Bollman 11/09/2012 1:09 PM

## 2013-01-02 ENCOUNTER — Other Ambulatory Visit: Payer: Self-pay | Admitting: Cardiovascular Disease

## 2013-01-04 ENCOUNTER — Other Ambulatory Visit: Payer: Self-pay | Admitting: *Deleted

## 2013-01-04 DIAGNOSIS — I2510 Atherosclerotic heart disease of native coronary artery without angina pectoris: Secondary | ICD-10-CM

## 2013-01-04 DIAGNOSIS — I10 Essential (primary) hypertension: Secondary | ICD-10-CM

## 2013-01-04 MED ORDER — LOSARTAN POTASSIUM 100 MG PO TABS: 100.0000 mg | ORAL_TABLET | Freq: Every day | ORAL | Status: DC

## 2013-01-04 MED ORDER — CLOPIDOGREL BISULFATE 75 MG PO TABS: 75.0000 mg | ORAL_TABLET | Freq: Every day | ORAL | Status: DC

## 2013-02-05 ENCOUNTER — Encounter: Payer: Medicare Other | Admitting: Internal Medicine

## 2013-02-19 ENCOUNTER — Other Ambulatory Visit: Payer: Self-pay | Admitting: Internal Medicine

## 2013-02-24 ENCOUNTER — Encounter: Payer: Medicare Other | Admitting: Internal Medicine

## 2013-03-04 ENCOUNTER — Encounter: Payer: Medicare Other | Admitting: Internal Medicine

## 2013-03-17 ENCOUNTER — Encounter: Payer: Self-pay | Admitting: Internal Medicine

## 2013-03-17 ENCOUNTER — Ambulatory Visit (INDEPENDENT_AMBULATORY_CARE_PROVIDER_SITE_OTHER): Payer: Medicare Other | Admitting: Internal Medicine

## 2013-03-17 VITALS — BP 124/80 | HR 60 | Temp 97.9°F | Ht 65.0 in | Wt 153.0 lb

## 2013-03-17 DIAGNOSIS — Z0000 Encounter for general adult medical examination without abnormal findings: Secondary | ICD-10-CM

## 2013-03-17 DIAGNOSIS — E785 Hyperlipidemia, unspecified: Secondary | ICD-10-CM

## 2013-03-17 DIAGNOSIS — I10 Essential (primary) hypertension: Secondary | ICD-10-CM

## 2013-03-17 LAB — LIPID PANEL
HDL: 53.7 mg/dL (ref >39.00)
Total CHOL/HDL Ratio: 3

## 2013-03-17 LAB — CBC WITH DIFFERENTIAL/PLATELET
Basophils Relative: 0.3 % (ref 0.0–3.0)
Eosinophils Absolute: 0.1 10*3/uL (ref 0.0–0.7)
HCT: 39.3 % (ref 36.0–46.0)
Hemoglobin: 12.9 g/dL (ref 12.0–15.0)
Lymphs Abs: 2.2 10*3/uL (ref 0.7–4.0)
MCHC: 33 g/dL (ref 30.0–36.0)
MCV: 91 fl (ref 78.0–100.0)
Monocytes Absolute: 0.6 10*3/uL (ref 0.1–1.0)
Neutro Abs: 6.9 10*3/uL (ref 1.4–7.7)
RBC: 4.31 Mil/uL (ref 3.87–5.11)

## 2013-03-17 LAB — BASIC METABOLIC PANEL
Calcium: 9.4 mg/dL (ref 8.4–10.5)
GFR: 74.82 mL/min (ref >60.00)
Sodium: 142 mEq/L (ref 135–145)

## 2013-03-17 LAB — POCT URINALYSIS DIPSTICK
Bilirubin, UA: NEGATIVE
Glucose, UA: NEGATIVE
Nitrite, UA: NEGATIVE
pH, UA: 5.5

## 2013-03-17 LAB — HEPATIC FUNCTION PANEL
Alkaline Phosphatase: 94 U/L (ref 39–117)
Bilirubin, Direct: 0 mg/dL (ref 0.0–0.3)

## 2013-03-17 MED ORDER — TRAZODONE HCL 50 MG PO TABS
25.0000 mg | ORAL_TABLET | Freq: Every evening | ORAL | Status: DC | PRN
Start: 2013-03-17 — End: 2013-11-10

## 2013-03-17 NOTE — Progress Notes (Signed)
Patient ID: Jennifer Gross, female   DOB: August 05, 1941, 72 y.o.   MRN: [id number]  cpx  Admits to anxiety, lack of sleep: 2nd son died suddenly  Past Medical History  Diagnosis Date  . CAD (coronary artery disease) 2008    status post anterior myocardial infarction, treated w/ a dru-eluting stent to the LAD  . GERD (gastroesophageal reflux disease)   . Hypertension   . Hyperlipidemia   . Allergy     Rhinitis  . ALLERGIC RHINITIS 04/08/2008  . CAD, NATIVE VESSEL 11/10/2009  . GERD 05/05/2008  . HYPERLIPIDEMIA 03/29/2008  . HYPERTENSION 03/29/2008    History   Social History  . Marital Status: Married    Spouse Name: N/A    Number of Children: N/A  . Years of Education: N/A   Occupational History  . Not on file.   Social History Main Topics  . Smoking status: Never Smoker   . Smokeless tobacco: Not on file  . Alcohol Use: No  . Drug Use: No  . Sexually Active:    Other Topics Concern  . Not on file   Social History Narrative  . No narrative on file    Past Surgical History  Procedure Laterality Date  . Percutaneous transluminal coronary angioplasty and drug-eluting  09/2007    Stent placement in the left anterior descending  . Appendectomy      Family History  Problem Relation Age of Onset  . Coronary artery disease Mother   . Heart disease Mother   . Coronary artery disease Maternal Uncle   . Lung cancer Father   . Heart disease Brother     stent placed    Allergies  Allergen Reactions  . Penicillins     REACTION: rash    Current Outpatient Prescriptions on File Prior to Visit  Medication Sig Dispense Refill  . aspirin 81 MG tablet Take 81 mg by mouth daily.        Marland Kitchen atorvastatin (LIPITOR) 80 MG tablet TAKE ONE TABLET BY MOUTH EVERY DAY OR AS DIRECTED  90 tablet  1  . carvedilol (COREG) 25 MG tablet TAKE ONE TABLET BY MOUTH TWICE DAILY  180 tablet  0  . clopidogrel (PLAVIX) 75 MG tablet Take 1 tablet (75 mg total) by mouth daily.  90 tablet  3  .  losartan (COZAAR) 100 MG tablet Take 1 tablet (100 mg total) by mouth daily.  90 tablet  3  . nitroGLYCERIN (NITROSTAT) 0.3 MG SL tablet Place 0.3 mg under the tongue every 5 (five) minutes as needed.        Marland Kitchen omeprazole (PRILOSEC) 20 MG capsule TAKE ONE CAPSULE BY MOUTH EVERY DAY  90 capsule  1   No current facility-administered medications on file prior to visit.     patient denies chest pain, shortness of breath, orthopnea. Denies lower extremity edema, abdominal pain, change in appetite, change in bowel movements. Patient denies rashes, musculoskeletal complaints. No other specific complaints in a complete review of systems.   BP 124/80  Pulse 60  Temp(Src) 97.9 F (36.6 C) (Oral)  Ht 5\' 5"  (1.651 m)  Wt 153 lb (69.4 kg)  BMI 25.46 kg/m2  Well-developed well-nourished female in no acute distress. HEENT exam atraumatic, normocephalic, extraocular muscles are intact. Neck is supple. No jugular venous distention no thyromegaly. Chest clear to auscultation without increased work of breathing. Cardiac exam S1 and S2 are regular. Abdominal exam active bowel sounds, soft, nontender. Extremities no edema. Neurologic exam she  is alert without any motor sensory deficits. Gait is normal.  Well visit--  Insomnia- trial trazadone 50mg  1/2 po qhs prn.

## 2013-03-18 ENCOUNTER — Other Ambulatory Visit: Payer: Self-pay | Admitting: Internal Medicine

## 2013-03-18 DIAGNOSIS — Z1231 Encounter for screening mammogram for malignant neoplasm of breast: Secondary | ICD-10-CM

## 2013-04-28 ENCOUNTER — Ambulatory Visit
Admission: RE | Admit: 2013-04-28 | Discharge: 2013-04-28 | Disposition: A | Discharge status: Home / Self Care | Payer: Medicare Other | Source: Ambulatory Visit | Attending: Internal Medicine | Admitting: Internal Medicine

## 2013-04-28 DIAGNOSIS — Z1231 Encounter for screening mammogram for malignant neoplasm of breast: Secondary | ICD-10-CM

## 2013-08-31 ENCOUNTER — Other Ambulatory Visit: Payer: Self-pay | Admitting: Internal Medicine

## 2013-09-24 ENCOUNTER — Ambulatory Visit (INDEPENDENT_AMBULATORY_CARE_PROVIDER_SITE_OTHER): Payer: Medicare Other

## 2013-09-24 DIAGNOSIS — Z23 Encounter for immunization: Secondary | ICD-10-CM

## 2013-11-10 ENCOUNTER — Ambulatory Visit (INDEPENDENT_AMBULATORY_CARE_PROVIDER_SITE_OTHER): Payer: Medicare Other | Admitting: Cardiovascular Disease

## 2013-11-10 ENCOUNTER — Encounter: Payer: Self-pay | Admitting: Cardiovascular Disease

## 2013-11-10 VITALS — BP 138/82 | HR 59 | Ht 65.0 in | Wt 155.0 lb

## 2013-11-10 DIAGNOSIS — E785 Hyperlipidemia, unspecified: Secondary | ICD-10-CM

## 2013-11-10 DIAGNOSIS — I10 Essential (primary) hypertension: Secondary | ICD-10-CM

## 2013-11-10 DIAGNOSIS — I2510 Atherosclerotic heart disease of native coronary artery without angina pectoris: Secondary | ICD-10-CM

## 2013-11-10 NOTE — Progress Notes (Signed)
    HPI:  72 year-old woman presenting for followup evaluation. The patient has coronary artery disease and initially presented in 2008 with an anterior wall infarction. She was treated with overlapping drug-eluting stents in the LAD and balloon angioplasty of the diagonal branch. She's been maintained on dual antiplatelet therapy with aspirin and Plavix long term. Last lab work from 03/17/2013 shows a cholesterol of 149, triglycerides 81, HDL 54, and LDL 79. Her metabolic panel and liver function tests were normal.  She's had a really difficult year. Her son died suddenly and unexpectedly in December 2013. Her husband had a stroke in January. From a cardiac perspective, she's had no problems. She denies chest pain, chest pressure, shortness of breath, edema, or palpitations. She has occasional acid reflux and takes omeprazole as needed.   Outpatient Encounter Prescriptions as of 11/10/2013  Medication Sig  . aspirin 81 MG tablet Take 81 mg by mouth daily.    Marland Kitchen atorvastatin (LIPITOR) 80 MG tablet TAKE ONE TABLET BY MOUTH EVERY DAY OR AS DIRECTED  . carvedilol (COREG) 25 MG tablet TAKE ONE TABLET BY MOUTH TWICE DAILY  . clopidogrel (PLAVIX) 75 MG tablet Take 1 tablet (75 mg total) by mouth daily.  Marland Kitchen losartan (COZAAR) 100 MG tablet Take 1 tablet (100 mg total) by mouth daily.  . nitroGLYCERIN (NITROSTAT) 0.3 MG SL tablet Place 0.3 mg under the tongue every 5 (five) minutes as needed.    Marland Kitchen omeprazole (PRILOSEC) 20 MG capsule TAKE ONE CAPSULE BY MOUTH EVERY DAY  . [DISCONTINUED] traZODone (DESYREL) 50 MG tablet Take 0.5-1 tablets (25-50 mg total) by mouth at bedtime as needed for sleep.    Allergies  Allergen Reactions  . Penicillins     REACTION: rash    Past Medical History  Diagnosis Date  . CAD (coronary artery disease) 2008    status post anterior myocardial infarction, treated w/ a dru-eluting stent to the LAD  . GERD (gastroesophageal reflux disease)   . Hypertension   .  Hyperlipidemia   . Allergy     Rhinitis  . ALLERGIC RHINITIS 04/08/2008  . CAD, NATIVE VESSEL 11/10/2009  . GERD 05/05/2008  . HYPERLIPIDEMIA 03/29/2008  . HYPERTENSION 03/29/2008    ROS: Negative except as per HPI  BP 138/82  Pulse 59  Ht 5\' 5"  (1.651 m)  Wt 155 lb (70.308 kg)  BMI 25.79 kg/m2  PHYSICAL EXAM: Pt is alert and oriented, NAD HEENT: normal Neck: JVP - normal, carotids 2+= without bruits Lungs: CTA bilaterally CV: RRR without murmur or gallop Abd: soft, NT, Positive BS, no hepatomegaly Ext: no C/C/E, distal pulses intact and equal Skin: warm/dry no rash  EKG:  Sinus rhythm 59 beats per minute. Nonspecific ST segment abnormality.  ASSESSMENT AND PLAN: 1. Coronary atherosclerosis, native vessel. The patient is stable without anginal symptoms now 6 years out from anterior wall myocardial infarction. Will continue her current medical therapy which was reviewed today.  2. Hyperlipidemia. Cholesterol panel reviewed and she will continue on high-dose atorvastatin.  3. Hypertension. Blood pressure well controlled on current medical program. Renal function panel was normal earlier this year. No changes were made today.  Tonny Bollman 11/10/2013 9:17 AM

## 2013-11-10 NOTE — Patient Instructions (Signed)
Your physician wants you to follow-up in: 1 YEAR with Dr Cooper.  You will receive a reminder letter in the mail two months in advance. If you don't receive a letter, please call our office to schedule the follow-up appointment.  Your physician recommends that you continue on your current medications as directed. Please refer to the Current Medication list given to you today.  

## 2013-12-06 ENCOUNTER — Other Ambulatory Visit: Payer: Self-pay | Admitting: Internal Medicine

## 2014-02-22 ENCOUNTER — Other Ambulatory Visit: Payer: Self-pay | Admitting: Internal Medicine

## 2014-02-22 ENCOUNTER — Other Ambulatory Visit: Payer: Self-pay | Admitting: Cardiovascular Disease

## 2014-03-21 ENCOUNTER — Encounter: Payer: Self-pay | Admitting: Internal Medicine

## 2014-03-21 ENCOUNTER — Ambulatory Visit (INDEPENDENT_AMBULATORY_CARE_PROVIDER_SITE_OTHER): Payer: Commercial Managed Care - HMO | Admitting: Internal Medicine

## 2014-03-21 VITALS — BP 124/82 | HR 76 | Temp 98.1°F | Ht 65.0 in | Wt 157.0 lb

## 2014-03-21 DIAGNOSIS — Z0000 Encounter for general adult medical examination without abnormal findings: Secondary | ICD-10-CM

## 2014-03-21 LAB — LIPID PANEL
CHOL/HDL RATIO: 3
Cholesterol: 166 mg/dL (ref 0–200)
HDL: 58.2 mg/dL (ref >39.00)
LDL CALC: 93 mg/dL (ref 0–99)
TRIGLYCERIDES: 76 mg/dL (ref 0.0–149.0)
VLDL: 15.2 mg/dL (ref 0.0–40.0)

## 2014-03-21 LAB — POCT URINALYSIS DIPSTICK
Bilirubin, UA: NEGATIVE
Glucose, UA: NEGATIVE
Ketones, UA: NEGATIVE
NITRITE UA: NEGATIVE
PH UA: 6
PROTEIN UA: NEGATIVE
RBC UA: NEGATIVE
Spec Grav, UA: 1.025
UROBILINOGEN UA: 0.2

## 2014-03-21 LAB — TSH: TSH: 3.15 u[IU]/mL (ref 0.35–5.50)

## 2014-03-21 LAB — BASIC METABOLIC PANEL
BUN: 20 mg/dL (ref 6–23)
CALCIUM: 9.5 mg/dL (ref 8.4–10.5)
CO2: 26 meq/L (ref 19–32)
CREATININE: 0.8 mg/dL (ref 0.4–1.2)
Chloride: 105 mEq/L (ref 96–112)
GFR: 77.98 mL/min (ref >60.00)
Glucose, Bld: 97 mg/dL (ref 70–99)
Potassium: 4.2 mEq/L (ref 3.5–5.1)
Sodium: 138 mEq/L (ref 135–145)

## 2014-03-21 LAB — HEPATIC FUNCTION PANEL
ALK PHOS: 86 U/L (ref 39–117)
ALT: 19 U/L (ref 0–35)
AST: 24 U/L (ref 0–37)
Albumin: 3.8 g/dL (ref 3.5–5.2)
BILIRUBIN DIRECT: 0.1 mg/dL (ref 0.0–0.3)
BILIRUBIN TOTAL: 0.7 mg/dL (ref 0.3–1.2)
TOTAL PROTEIN: 6.8 g/dL (ref 6.0–8.3)

## 2014-03-21 NOTE — Progress Notes (Signed)
Pre visit review using our clinic review tool, if applicable. No additional management support is needed unless otherwise documented below in the visit note. 

## 2014-03-21 NOTE — Progress Notes (Signed)
cpx  She has some reflux and some dysphagia- reviewed EGD  Past Medical History  Diagnosis Date  . CAD (coronary artery disease) 2008    status post anterior myocardial infarction, treated w/ a dru-eluting stent to the LAD  . GERD (gastroesophageal reflux disease)   . Hypertension   . Hyperlipidemia   . Allergy     Rhinitis  . ALLERGIC RHINITIS 04/08/2008  . CAD, NATIVE VESSEL 11/10/2009  . GERD 05/05/2008  . HYPERLIPIDEMIA 03/29/2008  . HYPERTENSION 03/29/2008    History   Social History  . Marital Status: Married    Spouse Name: N/A    Number of Children: N/A  . Years of Education: N/A   Occupational History  . Not on file.   Social History Main Topics  . Smoking status: Never Smoker   . Smokeless tobacco: Not on file  . Alcohol Use: No  . Drug Use: No  . Sexual Activity:    Other Topics Concern  . Not on file   Social History Narrative  . No narrative on file    Past Surgical History  Procedure Laterality Date  . Percutaneous transluminal coronary angioplasty and drug-eluting  09/2007    Stent placement in the left anterior descending  . Appendectomy      Family History  Problem Relation Age of Onset  . Coronary artery disease Mother   . Heart disease Mother   . Coronary artery disease Maternal Uncle   . Lung cancer Father   . Heart disease Brother     stent placed    Allergies  Allergen Reactions  . Penicillins     REACTION: rash    Current Outpatient Prescriptions on File Prior to Visit  Medication Sig Dispense Refill  . aspirin 81 MG tablet Take 81 mg by mouth daily.        Marland Kitchen atorvastatin (LIPITOR) 80 MG tablet TAKE ONE TABLET BY MOUTH EVERY DAY OR AS DIRECTED  90 tablet  0  . carvedilol (COREG) 25 MG tablet TAKE ONE TABLET BY MOUTH TWICE DAILY  180 tablet  0  . clopidogrel (PLAVIX) 75 MG tablet TAKE ONE TABLET BY MOUTH EVERY DAY  90 tablet  0  . losartan (COZAAR) 100 MG tablet TAKE ONE TABLET BY MOUTH EVERY DAY  90 tablet  0  . nitroGLYCERIN  (NITROSTAT) 0.3 MG SL tablet Place 0.3 mg under the tongue every 5 (five) minutes as needed.        Marland Kitchen omeprazole (PRILOSEC) 20 MG capsule TAKE ONE CAPSULE BY MOUTH EVERY DAY  90 capsule  0   No current facility-administered medications on file prior to visit.     patient denies chest pain, shortness of breath, orthopnea. Denies lower extremity edema, abdominal pain, change in appetite, change in bowel movements. Patient denies rashes, musculoskeletal complaints. No other specific complaints in a complete review of systems.   BP 124/82  Pulse 76  Temp(Src) 98.1 F (36.7 C) (Oral)  Ht 5\' 5"  (1.651 m)  Wt 157 lb (71.215 kg)  BMI 26.13 kg/m2  Well-developed well-nourished female in no acute distress. HEENT exam atraumatic, normocephalic, extraocular muscles are intact. Neck is supple. No jugular venous distention no thyromegaly. Chest clear to auscultation without increased work of breathing. Cardiac exam S1 and S2 are regular. Abdominal exam active bowel sounds, soft, nontender. Extremities no edema. Neurologic exam she is alert without any motor sensory deficits. Gait is normal.   Well visit- health maint UTD

## 2014-03-22 LAB — CBC WITH DIFFERENTIAL/PLATELET
BASOS PCT: 0.1 % (ref 0.0–3.0)
Basophils Absolute: 0 10*3/uL (ref 0.0–0.1)
EOS ABS: 0.1 10*3/uL (ref 0.0–0.7)
Eosinophils Relative: 1.5 % (ref 0.0–5.0)
HCT: 40.1 % (ref 36.0–46.0)
Hemoglobin: 13 g/dL (ref 12.0–15.0)
LYMPHS PCT: 21 % (ref 12.0–46.0)
Lymphs Abs: 1.8 10*3/uL (ref 0.7–4.0)
MCHC: 32.5 g/dL (ref 30.0–36.0)
MCV: 92.8 fl (ref 78.0–100.0)
MONO ABS: 0.2 10*3/uL (ref 0.1–1.0)
Monocytes Relative: 2.7 % — ABNORMAL LOW (ref 3.0–12.0)
NEUTROS PCT: 74.7 % (ref 43.0–77.0)
Neutro Abs: 6.4 10*3/uL (ref 1.4–7.7)
PLATELETS: 234 10*3/uL (ref 150.0–400.0)
RBC: 4.32 Mil/uL (ref 3.87–5.11)
RDW: 14.4 % (ref 11.5–14.6)
WBC: 8.6 10*3/uL (ref 4.5–10.5)

## 2014-03-31 ENCOUNTER — Encounter: Payer: Self-pay | Admitting: Internal Medicine

## 2014-03-31 ENCOUNTER — Other Ambulatory Visit: Payer: Self-pay | Admitting: Internal Medicine

## 2014-03-31 DIAGNOSIS — K219 Gastro-esophageal reflux disease without esophagitis: Secondary | ICD-10-CM

## 2014-04-11 ENCOUNTER — Other Ambulatory Visit: Payer: Self-pay

## 2014-04-11 DIAGNOSIS — Z1231 Encounter for screening mammogram for malignant neoplasm of breast: Secondary | ICD-10-CM

## 2014-04-19 ENCOUNTER — Other Ambulatory Visit: Payer: Self-pay | Admitting: Internal Medicine

## 2014-04-19 MED ORDER — FLUOROURACIL 5 % EX CREA: TOPICAL_CREAM | Freq: Two times a day (BID) | CUTANEOUS | Status: AC

## 2014-05-02 ENCOUNTER — Ambulatory Visit
Admission: RE | Admit: 2014-05-02 | Discharge: 2014-05-02 | Disposition: A | Discharge status: Home / Self Care | Payer: Commercial Managed Care - HMO | Source: Ambulatory Visit

## 2014-05-02 DIAGNOSIS — Z1231 Encounter for screening mammogram for malignant neoplasm of breast: Secondary | ICD-10-CM

## 2014-05-23 ENCOUNTER — Encounter: Payer: Self-pay | Admitting: Internal Medicine

## 2014-05-23 ENCOUNTER — Ambulatory Visit (INDEPENDENT_AMBULATORY_CARE_PROVIDER_SITE_OTHER): Payer: Commercial Managed Care - HMO | Admitting: Internal Medicine

## 2014-05-23 VITALS — BP 168/86 | HR 84 | Ht 64.17 in | Wt 157.5 lb

## 2014-05-23 DIAGNOSIS — Z5181 Encounter for therapeutic drug level monitoring: Secondary | ICD-10-CM

## 2014-05-23 DIAGNOSIS — K222 Esophageal obstruction: Secondary | ICD-10-CM

## 2014-05-23 DIAGNOSIS — Z7902 Long term (current) use of antithrombotics/antiplatelets: Secondary | ICD-10-CM

## 2014-05-23 DIAGNOSIS — I2510 Atherosclerotic heart disease of native coronary artery without angina pectoris: Secondary | ICD-10-CM

## 2014-05-23 DIAGNOSIS — K219 Gastro-esophageal reflux disease without esophagitis: Secondary | ICD-10-CM

## 2014-05-23 DIAGNOSIS — R1310 Dysphagia, unspecified: Secondary | ICD-10-CM

## 2014-05-23 NOTE — Patient Instructions (Signed)

## 2014-05-23 NOTE — Progress Notes (Signed)
HISTORY OF PRESENT ILLNESS:  Jennifer Gross is a 73 y.o. female with coronary artery disease, remote coronary artery stent placement on Plavix, hypertension, and hyperlipidemia. She presents today regarding management of her chronic GERD and new-onset dysphagia. She is a former patient of Dr. Verl Blalock prior to his retirement. She was last seen by Dr. Sharlett Iles in 2009. At that time she underwent screening colonoscopy. Examination revealed scattered diverticula in the sigmoid colon but was otherwise normal. Followup in 10 years recommended. Upper endoscopy was performed and revealed a hiatal hernia and erosive esophagitis. She was placed on PPI. She continues on Prilosec 20 mg daily. On medication no reflux symptoms. Over the past year she reports intermittent solid food dysphagia item such as potatoes. No liquid dysphagia. No weight loss. Her chronic medical problems are stable. She sees Dr. Sherren Mocha for her cardiac care.  REVIEW OF SYSTEMS:  All non-GI ROS negative except for cough Past Medical History  Diagnosis Date  . CAD (coronary artery disease) 2008    status post anterior myocardial infarction, treated w/ a dru-eluting stent to the LAD  . GERD (gastroesophageal reflux disease)   . ALLERGIC RHINITIS 04/08/2008  . CAD, NATIVE VESSEL 11/10/2009  . HYPERLIPIDEMIA 03/29/2008  . HYPERTENSION 03/29/2008  . Hiatal hernia   . Basal cell carcinoma     Past Surgical History  Procedure Laterality Date  . Percutaneous transluminal coronary angioplasty and drug-eluting  09/2007    Stent placement in the left anterior descending  . Appendectomy    . Basal cell carcinoma excision      back    Social History Jennifer Gross  reports that she has never smoked. She has never used smokeless tobacco. She reports that she does not drink alcohol or use illicit drugs.  family history includes Coronary artery disease in her maternal uncle and mother; Heart disease in her brother and mother;  Lung cancer in her father.  Allergies  Allergen Reactions  . Penicillins     REACTION: rash       PHYSICAL EXAMINATION: Vital signs: BP 168/86  Pulse 84  Ht 5' 4.17" (1.63 m)  Wt 157 lb 8 oz (71.442 kg)  BMI 26.89 kg/m2 General: Well-developed, well-nourished, no acute distress HEENT: Sclerae are anicteric, conjunctiva pink. Oral mucosa intact Lungs: Clear Heart: Regular Abdomen: soft, nontender, nondistended, no obvious ascites, no peritoneal signs, normal bowel sounds. No organomegaly. Extremities: No edema Psychiatric: alert and oriented x3. Cooperative      ASSESSMENT:  #1. Chronic GERD. Previous endoscopy in 2009 with erosive esophagitis and hiatal hernia #2. New onset intermittent solid food dysphagia. Rule out peptic stricture #3. Multiple general medical problems including coronary artery disease with prior stents which she is on Plavix #4. Screening colonoscopy 2009 with diverticulosis   PLAN:  #1. Reflux precautions #2. Continue PPI #3. Upper endoscopy with probable esophageal dilation. She is high-risk given her comorbidities.The nature of the procedure, as well as the risks, benefits, and alternatives were carefully and thoroughly reviewed with the patient. Ample time for discussion and questions allowed. The patient understood, was satisfied, and agreed to proceed. #4. Will consult with Dr. Burt Knack. Would like the patient to hold Plavix 5 days prior to the procedure while maintaining her daily aspirin therapy. #5. Repeat screening colonoscopy around 2019

## 2014-05-26 ENCOUNTER — Telehealth: Payer: Self-pay

## 2014-05-26 NOTE — Telephone Encounter (Signed)
Message copied by Audrea Muscat on Thu May 26, 2014  2:22 PM ------      Message from: Barkley Boards      Created: Tue May 24, 2014 12:00 PM      Regarding: note about plavix                   ----- Message -----         From: Sherren Mocha, MD         Sent: 05/24/2014  11:53 AM           To: Lurline Del, RN, Irene Shipper, MD            John - she's at reasonable risk to hold plavix 5 days before EGD. thx Ronalee Belts      ----- Message -----         From: Irene Shipper, MD         Sent: 05/23/2014   3:40 PM           To: Sherren Mocha, MD                   ------

## 2014-05-26 NOTE — Telephone Encounter (Signed)
Told patient that per Dr. Burt Knack, she could hold her Plavix for 5 days prior to her procedure.  Patient acknowledged and understood.

## 2014-06-01 ENCOUNTER — Other Ambulatory Visit: Payer: Self-pay | Admitting: Cardiovascular Disease

## 2014-06-17 ENCOUNTER — Encounter: Payer: Self-pay | Admitting: Internal Medicine

## 2014-06-17 ENCOUNTER — Ambulatory Visit (AMBULATORY_SURGERY_CENTER): Payer: Commercial Managed Care - HMO | Admitting: Internal Medicine

## 2014-06-17 VITALS — BP 155/77 | HR 71 | Temp 96.6°F | Resp 28 | Ht 64.0 in | Wt 157.0 lb

## 2014-06-17 DIAGNOSIS — R1310 Dysphagia, unspecified: Secondary | ICD-10-CM

## 2014-06-17 DIAGNOSIS — K219 Gastro-esophageal reflux disease without esophagitis: Secondary | ICD-10-CM

## 2014-06-17 DIAGNOSIS — K222 Esophageal obstruction: Secondary | ICD-10-CM

## 2014-06-17 MED ORDER — SODIUM CHLORIDE 0.9 % IV SOLN: 500.0000 mL | INTRAVENOUS | Status: DC

## 2014-06-17 NOTE — Progress Notes (Signed)
Report to PACU, RN, vss, BBS= Clear.  

## 2014-06-17 NOTE — Progress Notes (Signed)
Called to room to assist during endoscopic procedure.  Patient ID and intended procedure confirmed with present staff. Received instructions for my participation in the procedure from the performing physician.  

## 2014-06-17 NOTE — Op Note (Addendum)
[facility]  [address]. [place], [address]   ENDOSCOPY PROCEDURE REPORT  PATIENT: Jennifer, Gross  MR#: [id number] BIRTHDATE: November 08, 1941 , 72  yrs. old GENDER: Female ENDOSCOPIST: Eustace Quail, MD REFERRED BY:  .  Self / Office PROCEDURE DATE:  06/17/2014 PROCEDURE:  EGD with balloon dilation of esophagus  -18, 19, 20 mm ASA CLASS:     Class III INDICATIONS:  Dysphagia. MEDICATIONS: MAC sedation, administered by CRNA and propofol (Diprivan) 210mg  IV TOPICAL ANESTHETIC: none  DESCRIPTION OF PROCEDURE: After the risks benefits and alternatives of the procedure were thoroughly explained, informed consent was obtained.  The LB [id number] endoscope was introduced through the mouth and advanced to the second portion of the duodenum. Without limitations.  The instrument was slowly withdrawn as the mucosa was fully examined.      EXAM:The esophagus revealed a benign ringlike 15 mm distal esophageal stricture at the gastroesophageal junction (35 cm from the incisors).  The stomach was normal.  The duodenum was normal. Retroflexed views revealed a hiatal hernia.     The scope was then withdrawn from the patient and the procedure completed. A 54 French Maloney dilator would not pass the posterior pharynx easily. Thus, the endoscope was reintroduced (no trauma present). THERAPY: A sequential TTS balloon was passed through the scope and straddled across the stricture. This was dilated at 18, then 19, and 20 mm. Mild resistance. No heme. Tolerated well  COMPLICATIONS: There were no complications. ENDOSCOPIC IMPRESSION: 1. Benign distal esophageal stricture status post balloon dilation to 20 mm 2. GERD  RECOMMENDATIONS: 1.  Clear liquids until 6 PM, then soft foods rest of day.  Resume prior diet tomorrow. 2.  Continue Prilosec daily 3. Resume Plavix today  REPEAT EXAM:  eSigned:  Eustace Quail, MD 06/17/2014 4:25 PM Revised: 06/17/2014 4:25  PM  CC:The Patient and Lisabeth Pick, MD  PATIENT NAME:  Jennifer, Gross MR#: [id number]

## 2014-06-17 NOTE — Patient Instructions (Signed)

## 2014-06-20 ENCOUNTER — Telehealth: Payer: Self-pay

## 2014-06-20 NOTE — Telephone Encounter (Signed)
  Follow up Call-  Call back number 06/17/2014  Post procedure Call Back phone  # [phone]  Permission to leave phone message Yes     Patient questions:  Do you have a fever, pain , or abdominal swelling? No. Pain Score  0 *  Have you tolerated food without any problems? Yes.    Have you been able to return to your normal activities? Yes.    Do you have any questions about your discharge instructions: Diet   No. Medications  No. Follow up visit  No.  Do you have questions or concerns about your Care? No.  Actions: * If pain score is 4 or above: No action needed, pain <4.

## 2014-08-22 ENCOUNTER — Other Ambulatory Visit: Payer: Self-pay | Admitting: Internal Medicine

## 2014-09-22 ENCOUNTER — Other Ambulatory Visit: Payer: Self-pay | Admitting: Internal Medicine

## 2014-10-03 ENCOUNTER — Telehealth: Payer: Self-pay | Admitting: Internal Medicine

## 2014-10-03 NOTE — Telephone Encounter (Signed)
Pt will est w/ [facility] in dec and needs a flu shot. Her husband is scheduled for tomorrow. Is it ok if I add one more? You have 2 already. Thanks!

## 2014-10-03 NOTE — Telephone Encounter (Signed)
Authorization in suspended status [id number]- awaiting approval threw silverback  Per pt husband call wanted a referral for his wife to see  Fabio Pierce MD   Address: [address], [place], [place] [address]  [phone]

## 2014-10-03 NOTE — Telephone Encounter (Signed)
Ok thanks 

## 2014-10-03 NOTE — Telephone Encounter (Signed)
Can you see if she can come another day or put her on flu clinic schedule since i already have 2 injections scheduled. Thanks

## 2014-10-04 ENCOUNTER — Ambulatory Visit (INDEPENDENT_AMBULATORY_CARE_PROVIDER_SITE_OTHER): Payer: Commercial Managed Care - HMO

## 2014-10-04 DIAGNOSIS — Z23 Encounter for immunization: Secondary | ICD-10-CM

## 2014-10-04 NOTE — Telephone Encounter (Signed)
Approved.  

## 2014-11-11 ENCOUNTER — Ambulatory Visit (INDEPENDENT_AMBULATORY_CARE_PROVIDER_SITE_OTHER): Payer: Commercial Managed Care - HMO | Admitting: Cardiovascular Disease

## 2014-11-11 ENCOUNTER — Encounter: Payer: Self-pay | Admitting: Cardiovascular Disease

## 2014-11-11 VITALS — BP 160/90 | HR 62 | Ht 64.0 in | Wt 152.8 lb

## 2014-11-11 DIAGNOSIS — I10 Essential (primary) hypertension: Secondary | ICD-10-CM

## 2014-11-11 DIAGNOSIS — E785 Hyperlipidemia, unspecified: Secondary | ICD-10-CM

## 2014-11-11 MED ORDER — NITROGLYCERIN 0.4 MG SL SUBL: 0.4000 mg | SUBLINGUAL_TABLET | SUBLINGUAL | Status: DC | PRN

## 2014-11-11 NOTE — Patient Instructions (Signed)
Your physician wants you to follow-up in: 1 YEAR with Dr Cooper.  You will receive a reminder letter in the mail two months in advance. If you don't receive a letter, please call our office to schedule the follow-up appointment.  Your physician recommends that you continue on your current medications as directed. Please refer to the Current Medication list given to you today.  

## 2014-11-11 NOTE — Progress Notes (Signed)
   Background: The patient is followed for coronary artery disease and initially presented in 2008 with an anterior wall infarction. She was treated with overlapping drug-eluting stents in the LAD and balloon angioplasty of the diagonal branch. She's been maintained on dual antiplatelet therapy with aspirin and Plavix long term.  HPI:  73 year old woman presenting for follow-up evaluation. She was last seen 1 year ago. She's doing well without cardiac symptoms. She denies CP, chest pressure, edema, lightheadedness, palpitations, or dyspnea. She's compliant with medications. Reports home BP's in 130's/80's.  Studies:  Lipid Panel     Component Value Date/Time   CHOL 166 03/21/2014 0940   TRIG 76.0 03/21/2014 0940   HDL 58.20 03/21/2014 0940   CHOLHDL 3 03/21/2014 0940   VLDL 15.2 03/21/2014 0940   LDLCALC 93 03/21/2014 0940     Outpatient Encounter Prescriptions as of 11/11/2014  Medication Sig  . aspirin 81 MG tablet Take 81 mg by mouth daily.    Marland Kitchen atorvastatin (LIPITOR) 80 MG tablet TAKE ONE TABLET BY MOUTH ONCE DAILY OR AS DIRECTED  . carvedilol (COREG) 25 MG tablet TAKE ONE TABLET BY MOUTH TWICE DAILY  . clopidogrel (PLAVIX) 75 MG tablet TAKE ONE TABLET BY MOUTH ONCE DAILY  . losartan (COZAAR) 100 MG tablet TAKE ONE TABLET BY MOUTH ONCE DAILY  . nitroGLYCERIN (NITROSTAT) 0.3 MG SL tablet Place 0.3 mg under the tongue every 5 (five) minutes as needed.    Marland Kitchen omeprazole (PRILOSEC) 20 MG capsule TAKE ONE CAPSULE BY MOUTH ONCE DAILY    Allergies  Allergen Reactions  . Penicillins     REACTION: rash    Past Medical History  Diagnosis Date  . CAD (coronary artery disease) 2008    status post anterior myocardial infarction, treated w/ a dru-eluting stent to the LAD  . GERD (gastroesophageal reflux disease)   . ALLERGIC RHINITIS 04/08/2008  . CAD, NATIVE VESSEL 11/10/2009  . HYPERLIPIDEMIA 03/29/2008  . HYPERTENSION 03/29/2008  . Hiatal hernia   . Allergy   . Basal cell carcinoma       back  . Myocardial infarction     2008  . Osteopenia     family history includes Coronary artery disease in her maternal uncle and mother; Heart disease in her brother and mother; Lung cancer in her father. There is no history of Colon cancer, Esophageal cancer, Rectal cancer, or Stomach cancer.   ROS: Negative except as per HPI  BP 160/90 mmHg  Pulse 62  Ht 5\' 4"  (1.626 m)  Wt 152 lb 12.8 oz (69.31 kg)  BMI 26.22 kg/m2  PHYSICAL EXAM: Pt is alert and oriented, NAD HEENT: normal Neck: JVP - normal, carotids 2+= without bruits Lungs: CTA bilaterally CV: RRR without murmur or gallop Abd: soft, NT, Positive BS, no hepatomegaly Ext: no C/C/E, distal pulses intact and equal Skin: warm/dry no rash  EKG:  Normal sinus rhythm 62 bpm, nonspecific ST abnormality.  ASSESSMENT AND PLAN: 1. CAD, native vessel, without angina. Meds reviewed. Continue same Rx. Encouraged efforts at exercise. Will see back in one year.   2. Hyperlipidemia: last lipids reviewed. Continue high-dose atorvastatin and work on lifestyle modification.  3. HTN, essential: office BP elevated but she reports hx white-coat HTN. Home BP's in range. I asked her to record twice weekly and call if readings greater than 140/90.  Sherren Mocha, MD 11/11/2014 8:58 AM

## 2014-11-13 ENCOUNTER — Encounter: Payer: Self-pay | Admitting: Cardiovascular Disease

## 2014-12-02 ENCOUNTER — Encounter: Payer: Self-pay | Admitting: Family Medicine

## 2014-12-02 ENCOUNTER — Ambulatory Visit (INDEPENDENT_AMBULATORY_CARE_PROVIDER_SITE_OTHER): Payer: Commercial Managed Care - HMO | Admitting: Family Medicine

## 2014-12-02 VITALS — BP 160/84 | HR 69 | Temp 98.0°F | Wt 155.0 lb

## 2014-12-02 DIAGNOSIS — Z23 Encounter for immunization: Secondary | ICD-10-CM

## 2014-12-02 DIAGNOSIS — I2510 Atherosclerotic heart disease of native coronary artery without angina pectoris: Secondary | ICD-10-CM

## 2014-12-02 DIAGNOSIS — E785 Hyperlipidemia, unspecified: Secondary | ICD-10-CM

## 2014-12-02 DIAGNOSIS — I10 Essential (primary) hypertension: Secondary | ICD-10-CM

## 2014-12-02 DIAGNOSIS — Z85828 Personal history of other malignant neoplasm of skin: Secondary | ICD-10-CM | POA: Insufficient documentation

## 2014-12-02 DIAGNOSIS — M810 Age-related osteoporosis without current pathological fracture: Secondary | ICD-10-CM | POA: Insufficient documentation

## 2014-12-02 NOTE — Progress Notes (Signed)
Jennifer Reddish, MD Phone: [phone]  Subjective:  Patient presents today to establish care with me as their new primary care provider. Patient was formerly a patient of Dr. Leanne Chang. Chief complaint-noted.   Hypertension-poor control in office ? White coat  CAD- asymptomatic, stable  BP Readings from Last 3 Encounters:  12/02/14 160/84  11/11/14 160/90  06/17/14 155/77  Home BP monitoring-134 is absolute highest at home Compliant with medications-yes without side effects ROS-Denies any CP, HA, SOB, blurry vision, LE edema.   Hyperlipidemia-reasonable control  Lab Results  Component Value Date   LDLCALC 93 03/21/2014  On statin: atorvastatin 80mg  ROS- no chest pain or shortness of breath. No myalgias  The following were reviewed and entered/updated in epic: Past Medical History  Diagnosis Date  . CAD (coronary artery disease) 2008    status post anterior myocardial infarction, treated w/ a dru-eluting stent to the LAD  . GERD (gastroesophageal reflux disease)   . ALLERGIC RHINITIS 04/08/2008  . CAD, NATIVE VESSEL 11/10/2009  . HYPERLIPIDEMIA 03/29/2008  . HYPERTENSION 03/29/2008  . Hiatal hernia   . Allergy   . Basal cell carcinoma     back  . Myocardial infarction     2008  . Osteopenia    Patient Active Problem List   Diagnosis Date Noted  . CAD, NATIVE VESSEL 11/10/2009    Priority: High  . Hyperlipidemia 03/29/2008    Priority: Medium  . Essential hypertension 03/29/2008    Priority: Medium  . Basal cell carcinoma     Priority: Low  . Osteopenia     Priority: Low  . GERD 05/05/2008    Priority: Low  . Allergic rhinitis 04/08/2008    Priority: Low   Past Surgical History  Procedure Laterality Date  . Percutaneous transluminal coronary angioplasty and drug-eluting  09/2007    Stent placement in the left anterior descending  . Appendectomy    . Basal cell carcinoma excision      back    Family History  Problem Relation Age of Onset  . Coronary artery  disease Mother   . Coronary artery disease Maternal Uncle   . Lung cancer Father   . Heart disease Brother     stent placed  . Colon cancer Neg Hx   . Esophageal cancer Neg Hx   . Rectal cancer Neg Hx   . Stomach cancer Neg Hx   . CAD Son     died 22 of MI    Medications- reviewed and updated Current Outpatient Prescriptions  Medication Sig Dispense Refill  . aspirin 81 MG tablet Take 81 mg by mouth daily.      Marland Kitchen atorvastatin (LIPITOR) 80 MG tablet TAKE ONE TABLET BY MOUTH ONCE DAILY OR AS DIRECTED 90 tablet 1  . carvedilol (COREG) 25 MG tablet TAKE ONE TABLET BY MOUTH TWICE DAILY 180 tablet 1  . clopidogrel (PLAVIX) 75 MG tablet TAKE ONE TABLET BY MOUTH ONCE DAILY 90 tablet 1  . losartan (COZAAR) 100 MG tablet TAKE ONE TABLET BY MOUTH ONCE DAILY 90 tablet 1  . omeprazole (PRILOSEC) 20 MG capsule TAKE ONE CAPSULE BY MOUTH ONCE DAILY 90 capsule 0  . nitroGLYCERIN (NITROSTAT) 0.4 MG SL tablet Place 1 tablet (0.4 mg total) under the tongue every 5 (five) minutes as needed for chest pain. (Patient not taking: Reported on 12/02/2014) 25 tablet 2   No current facility-administered medications for this visit.    Allergies-reviewed and updated Allergies  Allergen Reactions  . Penicillins  REACTION: rash    History   Social History  . Marital Status: Married    Spouse Name: N/A    Number of Children: 4  . Years of Education: N/A   Occupational History  . retired    Social History Main Topics  . Smoking status: Never Smoker   . Smokeless tobacco: Never Used  . Alcohol Use: No  . Drug Use: No  . Sexual Activity: Not on file   Other Topics Concern  . Not on file   Social History Narrative   Married 1970. 2 living children. 4 children (lost one at age 28 drug related to heart attack and one at 60 to heart attack not drug related), 6 grandkids. 2 greatgrandkids with 1 on the way March 2016.       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  CV: RRR no murmurs rubs or gallops Lungs: CTAB no crackles, wheeze, rhonchi Abdomen: soft/nontender/nondistended/normal bowel sounds.  Ext: no edema Skin: warm, dry, no rash  Neuro: grossly normal, moves all extremities, PERRLA   Assessment/Plan:  Essential hypertension Mild poor control on Losartan 100mg , carvedilol 25mg  BID in office. Home readings are well controlled-likely white coat element. We will confirm this by verifying home cuff at next visit for physical after April 1st.   Hyperlipidemia Well controlled on atorvastatin 80mg  with LDL <100. WIth CAD history, ideally would like less than 70 but would not do this other than with statin therapy and she is on max lipitor  CAD, NATIVE VESSEL Asymptomatic. Has not had to use nitroglycerin. Continue asa, statin, plavix. Follow up in around 6 months then will alternate every 6 months with Dr. Burt Knack.   6 month f/u for physical

## 2014-12-02 NOTE — Addendum Note (Signed)
Addended by: Clyde Lundborg A on: 12/02/2014 02:02 PM   Modules accepted: Orders

## 2014-12-02 NOTE — Patient Instructions (Addendum)
Tdap (tetanus and pertussis) and Prevnar today (pneumonia booster)  Bring your home cuff with you to next visit and we will compare our readings.   Schedule for next available physical after April 1st.

## 2014-12-02 NOTE — Assessment & Plan Note (Signed)
Mild poor control on Losartan 100mg , carvedilol 25mg  BID in office. Home readings are well controlled-likely white coat element. We will confirm this by verifying home cuff at next visit for physical after April 1st.

## 2014-12-02 NOTE — Assessment & Plan Note (Addendum)
Well controlled on atorvastatin 80mg  with LDL <100. WIth CAD history, ideally would like less than 70 but would not do this other than with statin therapy and she is on max lipitor

## 2014-12-02 NOTE — Assessment & Plan Note (Signed)
Asymptomatic. Has not had to use nitroglycerin. Continue asa, statin, plavix. Follow up in around 6 months then will alternate every 6 months with Dr. Burt Knack.

## 2014-12-07 ENCOUNTER — Other Ambulatory Visit: Payer: Self-pay | Admitting: Cardiovascular Disease

## 2014-12-26 DIAGNOSIS — Z08 Encounter for follow-up examination after completed treatment for malignant neoplasm: Secondary | ICD-10-CM | POA: Diagnosis not present

## 2014-12-26 DIAGNOSIS — Z85828 Personal history of other malignant neoplasm of skin: Secondary | ICD-10-CM | POA: Diagnosis not present

## 2014-12-26 DIAGNOSIS — L570 Actinic keratosis: Secondary | ICD-10-CM | POA: Diagnosis not present

## 2015-05-03 ENCOUNTER — Ambulatory Visit (INDEPENDENT_AMBULATORY_CARE_PROVIDER_SITE_OTHER): Payer: Commercial Managed Care - HMO | Admitting: Family Medicine

## 2015-05-03 ENCOUNTER — Encounter: Payer: Self-pay | Admitting: Family Medicine

## 2015-05-03 VITALS — BP 138/80 | HR 66 | Temp 97.9°F | Wt 156.0 lb

## 2015-05-03 DIAGNOSIS — E785 Hyperlipidemia, unspecified: Secondary | ICD-10-CM

## 2015-05-03 DIAGNOSIS — Z780 Asymptomatic menopausal state: Secondary | ICD-10-CM

## 2015-05-03 DIAGNOSIS — M8580 Other specified disorders of bone density and structure, unspecified site: Secondary | ICD-10-CM | POA: Diagnosis not present

## 2015-05-03 DIAGNOSIS — Z0000 Encounter for general adult medical examination without abnormal findings: Secondary | ICD-10-CM

## 2015-05-03 DIAGNOSIS — I10 Essential (primary) hypertension: Secondary | ICD-10-CM

## 2015-05-03 LAB — COMPREHENSIVE METABOLIC PANEL
ALBUMIN: 3.9 g/dL (ref 3.5–5.2)
ALT: 12 U/L (ref 0–35)
AST: 14 U/L (ref 0–37)
Alkaline Phosphatase: 94 U/L (ref 39–117)
BILIRUBIN TOTAL: 0.5 mg/dL (ref 0.2–1.2)
BUN: 20 mg/dL (ref 6–23)
CHLORIDE: 107 meq/L (ref 96–112)
CO2: 31 meq/L (ref 19–32)
CREATININE: 0.77 mg/dL (ref 0.40–1.20)
Calcium: 9.4 mg/dL (ref 8.4–10.5)
GFR: 77.74 mL/min (ref >60.00)
Glucose, Bld: 90 mg/dL (ref 70–99)
Potassium: 4.2 mEq/L (ref 3.5–5.1)
Sodium: 142 mEq/L (ref 135–145)
Total Protein: 6.9 g/dL (ref 6.0–8.3)

## 2015-05-03 LAB — CBC
HCT: 40.1 % (ref 36.0–46.0)
Hemoglobin: 13.2 g/dL (ref 12.0–15.0)
MCHC: 32.9 g/dL (ref 30.0–36.0)
MCV: 91.2 fl (ref 78.0–100.0)
Platelets: 233 10*3/uL (ref 150.0–400.0)
RBC: 4.4 Mil/uL (ref 3.87–5.11)
RDW: 15.1 % (ref 11.5–15.5)
WBC: 10.1 10*3/uL (ref 4.0–10.5)

## 2015-05-03 LAB — TSH: TSH: 3.25 u[IU]/mL (ref 0.35–4.50)

## 2015-05-03 LAB — LIPID PANEL
CHOLESTEROL: 164 mg/dL (ref 0–200)
HDL: 54.3 mg/dL (ref >39.00)
LDL CALC: 92 mg/dL (ref 0–99)
NONHDL: 109.7
Total CHOL/HDL Ratio: 3
Triglycerides: 90 mg/dL (ref 0.0–149.0)
VLDL: 18 mg/dL (ref 0.0–40.0)

## 2015-05-03 MED ORDER — MELOXICAM 7.5 MG PO TABS: 7.5000 mg | ORAL_TABLET | Freq: Every day | ORAL | Status: DC

## 2015-05-03 NOTE — Patient Instructions (Addendum)
Please have them send a copy of your note from Dr. Radene Knee as well as bone density exam as well as breast center.   Want to get most of your blood pressures below 140. Let's add DASH diet below and increase walking to 3-4 days a week from 1-2.   L shoulder seems like rotator cuff issue. Try 7 days of meloxicam and icing for 3 days then switch to heat. Start exercises after a week-stop anything that hurts it. See me back in 2 weeks if not improving.   DASH Eating Plan DASH stands for "Dietary Approaches to Stop Hypertension." The DASH eating plan is a healthy eating plan that has been shown to reduce high blood pressure (hypertension). Additional health benefits may include reducing the risk of type 2 diabetes mellitus, heart disease, and stroke. The DASH eating plan may also help with weight loss. WHAT DO I NEED TO KNOW ABOUT THE DASH EATING PLAN? For the DASH eating plan, you will follow these general guidelines:  Choose foods with a percent daily value for sodium of less than 5% (as listed on the food label).  Use salt-free seasonings or herbs instead of table salt or sea salt.  Check with your health care provider or pharmacist before using salt substitutes.  Eat lower-sodium products, often labeled as "lower sodium" or "no salt added."  Eat fresh foods.  Eat more vegetables, fruits, and low-fat dairy products.  Choose whole grains. Look for the word "whole" as the first word in the ingredient list.  Choose fish and skinless chicken or [place] more often than red meat. Limit fish, poultry, and meat to 6 oz (170 g) each day.  Limit sweets, desserts, sugars, and sugary drinks.  Choose heart-healthy fats.  Limit cheese to 1 oz (28 g) per day.  Eat more home-cooked food and less restaurant, buffet, and fast food.  Limit fried foods.  Cook foods using methods other than frying.  Limit canned vegetables. If you do use them, rinse them well to decrease the sodium.  When eating at a  restaurant, ask that your food be prepared with less salt, or no salt if possible. WHAT FOODS CAN I EAT? Seek help from a dietitian for individual calorie needs. Grains Whole grain or whole wheat bread. Brown rice. Whole grain or whole wheat pasta. Quinoa, bulgur, and whole grain cereals. Low-sodium cereals. Corn or whole wheat flour tortillas. Whole grain cornbread. Whole grain crackers. Low-sodium crackers. Vegetables Fresh or frozen vegetables (raw, steamed, roasted, or grilled). Low-sodium or reduced-sodium tomato and vegetable juices. Low-sodium or reduced-sodium tomato sauce and paste. Low-sodium or reduced-sodium canned vegetables.  Fruits All fresh, canned (in natural juice), or frozen fruits. Meat and Other Protein Products Ground beef (85% or leaner), grass-fed beef, or beef trimmed of fat. Skinless chicken or [place]. Ground chicken or [place]. Pork trimmed of fat. All fish and seafood. Eggs. Dried beans, peas, or lentils. Unsalted nuts and seeds. Unsalted canned beans. Dairy Low-fat dairy products, such as skim or 1% milk, 2% or reduced-fat cheeses, low-fat ricotta or cottage cheese, or plain low-fat yogurt. Low-sodium or reduced-sodium cheeses. Fats and Oils Tub margarines without trans fats. Light or reduced-fat mayonnaise and salad dressings (reduced sodium). Avocado. Safflower, olive, or canola oils. Natural peanut or almond butter. Other Unsalted popcorn and pretzels. The items listed above may not be a complete list of recommended foods or beverages. Contact your dietitian for more options. WHAT FOODS ARE NOT RECOMMENDED? Grains White bread. White pasta. White rice. Refined  cornbread. Bagels and croissants. Crackers that contain trans fat. Vegetables Creamed or fried vegetables. Vegetables in a cheese sauce. Regular canned vegetables. Regular canned tomato sauce and paste. Regular tomato and vegetable juices. Fruits Dried fruits. Canned fruit in light or heavy syrup. Fruit  juice. Meat and Other Protein Products Fatty cuts of meat. Ribs, chicken wings, bacon, sausage, bologna, salami, chitterlings, fatback, hot dogs, bratwurst, and packaged luncheon meats. Salted nuts and seeds. Canned beans with salt. Dairy Whole or 2% milk, cream, half-and-half, and cream cheese. Whole-fat or sweetened yogurt. Full-fat cheeses or blue cheese. Nondairy creamers and whipped toppings. Processed cheese, cheese spreads, or cheese curds. Condiments Onion and garlic salt, seasoned salt, table salt, and sea salt. Canned and packaged gravies. Worcestershire sauce. Tartar sauce. Barbecue sauce. Teriyaki sauce. Soy sauce, including reduced sodium. Steak sauce. Fish sauce. Oyster sauce. Cocktail sauce. Horseradish. Ketchup and mustard. Meat flavorings and tenderizers. Bouillon cubes. Hot sauce. Tabasco sauce. Marinades. Taco seasonings. Relishes. Fats and Oils Butter, stick margarine, lard, shortening, ghee, and bacon fat. Coconut, palm kernel, or palm oils. Regular salad dressings. Other Pickles and olives. Salted popcorn and pretzels. The items listed above may not be a complete list of foods and beverages to avoid. Contact your dietitian for more information. WHERE CAN I FIND MORE INFORMATION? National Heart, Lung, and Blood Institute: travelstabloid.com Document Released: 11/28/2011 Document Revised: 04/25/2014 Document Reviewed: 10/13/2013 [facility] Patient Information 2015 [place], [place]. This information is not intended to replace advice given to you by your health care provider. Make sure you discuss any questions you have with your health care provider.

## 2015-05-03 NOTE — Progress Notes (Signed)
Garret Reddish, MD Phone: [phone]  Subjective:  Patient presents today for their annual physical. Chief complaint-noted.   Home cuff high 130s low 140s over 70s and 80s  Dr. Radene Knee Monday for ob/gyn exam as well bone density. Including breast and pelvic.  Sees Dr. Allyson Sabal July 5th.   L shoulder pain for 2-3 weeks hurts when she moves the arm usually above head. Does not want shot. Husband had bursitis. 4-5/10 when started down to 3.  ROS- full  review of systems was completed and negative. Specifically no chest pan, shortness of breath, left arm weakness, recent fractures  The following were reviewed and entered/updated in epic: Past Medical History  Diagnosis Date  . CAD (coronary artery disease) 2008    status post anterior myocardial infarction, treated w/ a dru-eluting stent to the LAD  . GERD (gastroesophageal reflux disease)   . ALLERGIC RHINITIS 04/08/2008  . CAD, NATIVE VESSEL 11/10/2009  . HYPERLIPIDEMIA 03/29/2008  . HYPERTENSION 03/29/2008  . Hiatal hernia   . Allergy   . Basal cell carcinoma     back  . Myocardial infarction     2008  . Osteopenia    Patient Active Problem List   Diagnosis Date Noted  . CAD, NATIVE VESSEL 11/10/2009    Priority: High  . Hyperlipidemia 03/29/2008    Priority: Medium  . Essential hypertension 03/29/2008    Priority: Medium  . Basal cell carcinoma     Priority: Low  . Osteopenia     Priority: Low  . GERD 05/05/2008    Priority: Low  . Allergic rhinitis 04/08/2008    Priority: Low   Past Surgical History  Procedure Laterality Date  . Percutaneous transluminal coronary angioplasty and drug-eluting  09/2007    Stent placement in the left anterior descending  . Appendectomy    . Basal cell carcinoma excision      back    Family History  Problem Relation Age of Onset  . Coronary artery disease Mother   . Coronary artery disease Maternal Uncle   . Lung cancer Father   . Heart disease Brother     stent placed  .  Colon cancer Neg Hx   . Esophageal cancer Neg Hx   . Rectal cancer Neg Hx   . Stomach cancer Neg Hx   . CAD Son     died 34 of MI    Medications- reviewed and updated Current Outpatient Prescriptions  Medication Sig Dispense Refill  . aspirin 81 MG tablet Take 81 mg by mouth daily.      Marland Kitchen atorvastatin (LIPITOR) 80 MG tablet TAKE ONE TABLET BY MOUTH ONCE DAILY OR AS DIRECTED 90 tablet 1  . carvedilol (COREG) 25 MG tablet TAKE ONE TABLET BY MOUTH TWICE DAILY 180 tablet 1  . clopidogrel (PLAVIX) 75 MG tablet TAKE ONE TABLET BY MOUTH ONCE DAILY 90 tablet 1  . losartan (COZAAR) 100 MG tablet TAKE ONE TABLET BY MOUTH ONCE DAILY 90 tablet 1  . nitroGLYCERIN (NITROSTAT) 0.4 MG SL tablet Place 1 tablet (0.4 mg total) under the tongue every 5 (five) minutes as needed for chest pain. (Patient not taking: Reported on 05/03/2015) 25 tablet 2  . omeprazole (PRILOSEC) 20 MG capsule TAKE ONE CAPSULE BY MOUTH ONCE DAILY 90 capsule 0   No current facility-administered medications for this visit.   Allergies-reviewed and updated Allergies  Allergen Reactions  . Penicillins     REACTION: rash    History   Social History  .  Marital Status: Married    Spouse Name: N/A  . Number of Children: 4  . Years of Education: N/A   Occupational History  . retired    Social History Main Topics  . Smoking status: Never Smoker   . Smokeless tobacco: Never Used  . Alcohol Use: No  . Drug Use: No  . Sexual Activity: Not on file   Other Topics Concern  . None   Social History Narrative   Married 1970. 2 living children. 4 children (lost one at age 76 drug related to heart attack and one at 75 to heart attack not drug related), 6 grandkids. 2 greatgrandkids with 1 on the way March 2016.       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 Oropharynx normal Neck: no thyromegaly CV: RRR no murmurs rubs or gallops Lungs: CTAB no crackles, wheeze, rhonchi Abdomen: soft/nontender/nondistended/normal bowel sounds. No rebound or guarding.  Ext: no edema, 2+ PT pulses Skin: warm, dry Neuro: grossly normal, moves all extremities, PERRLA  Shoulder: Inspection reveals no abnormalities, atrophy or asymmetry. Palpation is normal with no tenderness over AC joint or bicipital groove. ROM is full in all planes. Rotator cuff strength normal throughout. Signs of impingement with negative Neer and Hawkin's tests. Negative empty can though Painful arc, no drop arm   Assessment/Plan:  74 y.o. female presenting for annual physical.  Health Maintenance counseling: 1. Anticipatory guidance: Patient counseled regarding regular dental exams, regular eye exams, wearing seatbelts, wearing sunscreen. History skin cancer followed by Dr. Allyson Sabal 2. Risk factor reduction:  Advised patient of need for regular exercise (walking 30 minutes 2-3x a week) and diet rich and fruits and vegetables to reduce risk of heart attack and stroke.  3. Immunizations/screenings/ancillary studies- DEXA planned through ob gyn  Essential hypertension Controlled in office but some home readings >140. Given CAD, want SBP <140. We focused on increasing exercise to 3-4x a week from 1-2 and working on [employer]. If remains high at follow up ,consider adding HCTZ   L shoulder rotator cuff injury Ice--> heat. Mobic for 7 days low dose given cardiac history and age and on plavix. Exercises after that. If not continuing to improve, return 2 weeks  1 year CPE, see Dr. Burt Knack 6 months  Labs largely normal Results for orders placed or performed in visit on 05/03/15 (from the past 24 hour(s))  CBC     Status: None   Collection Time: 05/03/15  9:14 AM  Result Value Ref Range   WBC 10.1 4.0 - 10.5 K/uL   RBC 4.40  3.87 - 5.11 Mil/uL   Platelets 233.0 150.0 - 400.0 K/uL   Hemoglobin 13.2 12.0 - 15.0 g/dL   HCT 40.1 36.0 - 46.0 %   MCV 91.2 78.0 - 100.0 fl   MCHC 32.9 30.0 - 36.0 g/dL   RDW 15.1 11.5 - 15.5 %  Comprehensive metabolic panel     Status: None   Collection Time: 05/03/15  9:14 AM  Result Value Ref Range   Sodium 142 135 - 145 mEq/L   Potassium 4.2 3.5 - 5.1 mEq/L   Chloride 107 96 - 112 mEq/L   CO2 31 19 - 32 mEq/L   Glucose,  Bld 90 70 - 99 mg/dL   BUN 20 6 - 23 mg/dL   Creatinine, Ser 0.77 0.40 - 1.20 mg/dL   Total Bilirubin 0.5 0.2 - 1.2 mg/dL   Alkaline Phosphatase 94 39 - 117 U/L   AST 14 0 - 37 U/L   ALT 12 0 - 35 U/L   Total Protein 6.9 6.0 - 8.3 g/dL   Albumin 3.9 3.5 - 5.2 g/dL   Calcium 9.4 8.4 - 10.5 mg/dL   GFR 77.74 >60.00 mL/min  Lipid panel     Status: None   Collection Time: 05/03/15  9:14 AM  Result Value Ref Range   Cholesterol 164 0 - 200 mg/dL   Triglycerides 90.0 0.0 - 149.0 mg/dL   HDL 54.30 >39.00 mg/dL   VLDL 18.0 0.0 - 40.0 mg/dL   LDL Cholesterol 92 0 - 99 mg/dL   Total CHOL/HDL Ratio 3    NonHDL 109.70   TSH     Status: None   Collection Time: 05/03/15  9:14 AM  Result Value Ref Range   TSH 3.25 0.35 - 4.50 uIU/mL   Meds ordered this encounter  Medications  . meloxicam (MOBIC) 7.5 MG tablet    Sig: Take 1 tablet (7.5 mg total) by mouth daily. Use 7-10 days    Dispense:  15 tablet    Refill:  0

## 2015-05-03 NOTE — Assessment & Plan Note (Addendum)
Controlled in office but some home readings >140. Given CAD, want SBP <140. We focused on increasing exercise to 3-4x a week from 1-2 and working on [employer]. If remains high at follow up ,consider adding HCTZ

## 2015-05-08 DIAGNOSIS — Z6826 Body mass index (BMI) 26.0-26.9, adult: Secondary | ICD-10-CM | POA: Diagnosis not present

## 2015-05-08 DIAGNOSIS — M816 Localized osteoporosis [Lequesne]: Secondary | ICD-10-CM | POA: Diagnosis not present

## 2015-05-08 DIAGNOSIS — N958 Other specified menopausal and perimenopausal disorders: Secondary | ICD-10-CM | POA: Diagnosis not present

## 2015-05-08 DIAGNOSIS — Z1212 Encounter for screening for malignant neoplasm of rectum: Secondary | ICD-10-CM | POA: Diagnosis not present

## 2015-05-08 DIAGNOSIS — Z124 Encounter for screening for malignant neoplasm of cervix: Secondary | ICD-10-CM | POA: Diagnosis not present

## 2015-05-08 LAB — HM DEXA SCAN

## 2015-05-15 ENCOUNTER — Encounter: Payer: Self-pay | Admitting: Family Medicine

## 2015-05-16 ENCOUNTER — Encounter: Payer: Self-pay | Admitting: Family Medicine

## 2015-06-09 ENCOUNTER — Other Ambulatory Visit: Payer: Self-pay | Admitting: Internal Medicine

## 2015-06-26 ENCOUNTER — Other Ambulatory Visit: Payer: Self-pay | Admitting: Cardiovascular Disease

## 2015-06-27 ENCOUNTER — Other Ambulatory Visit: Payer: Self-pay

## 2015-06-27 DIAGNOSIS — D225 Melanocytic nevi of trunk: Secondary | ICD-10-CM | POA: Diagnosis not present

## 2015-06-27 DIAGNOSIS — L814 Other melanin hyperpigmentation: Secondary | ICD-10-CM | POA: Diagnosis not present

## 2015-06-27 DIAGNOSIS — L570 Actinic keratosis: Secondary | ICD-10-CM | POA: Diagnosis not present

## 2015-06-27 DIAGNOSIS — L821 Other seborrheic keratosis: Secondary | ICD-10-CM | POA: Diagnosis not present

## 2015-06-27 MED ORDER — CLOPIDOGREL BISULFATE 75 MG PO TABS: 75.0000 mg | ORAL_TABLET | Freq: Every day | ORAL | Status: DC

## 2015-06-27 MED ORDER — LOSARTAN POTASSIUM 100 MG PO TABS: 100.0000 mg | ORAL_TABLET | Freq: Every day | ORAL | Status: DC

## 2015-09-28 ENCOUNTER — Ambulatory Visit (INDEPENDENT_AMBULATORY_CARE_PROVIDER_SITE_OTHER): Payer: Commercial Managed Care - HMO

## 2015-09-28 DIAGNOSIS — Z23 Encounter for immunization: Secondary | ICD-10-CM

## 2015-10-13 DIAGNOSIS — H2513 Age-related nuclear cataract, bilateral: Secondary | ICD-10-CM | POA: Diagnosis not present

## 2015-11-22 ENCOUNTER — Other Ambulatory Visit: Payer: Self-pay | Admitting: Internal Medicine

## 2016-01-04 ENCOUNTER — Ambulatory Visit (INDEPENDENT_AMBULATORY_CARE_PROVIDER_SITE_OTHER): Payer: Commercial Managed Care - HMO | Admitting: Cardiovascular Disease

## 2016-01-04 ENCOUNTER — Encounter: Payer: Self-pay | Admitting: Cardiovascular Disease

## 2016-01-04 VITALS — BP 170/90 | HR 60 | Ht 65.5 in | Wt 150.4 lb

## 2016-01-04 DIAGNOSIS — I10 Essential (primary) hypertension: Secondary | ICD-10-CM | POA: Diagnosis not present

## 2016-01-04 DIAGNOSIS — I2510 Atherosclerotic heart disease of native coronary artery without angina pectoris: Secondary | ICD-10-CM | POA: Diagnosis not present

## 2016-01-04 MED ORDER — HYDROCHLOROTHIAZIDE 25 MG PO TABS: 25.0000 mg | ORAL_TABLET | Freq: Every day | ORAL | Status: DC

## 2016-01-04 NOTE — Progress Notes (Signed)
Cardiology Office Note Date:  01/04/2016   ID:  Jennifer Gross, DOB 1941/01/30, MRN [id number]  PCP:  Garret Reddish, MD  Cardiologist:  Sherren Mocha, MD    Chief Complaint  Patient presents with  . Annual Exam    cad.  Patient denies any shortness of breath, chest pain, or le edema   History of Present Illness: Jennifer Gross is a 75 y.o. female who presents for follow-up evaluation. The patient is followed for coronary artery disease and initially presented in 2008 with an anterior wall infarction. She was treated with overlapping drug-eluting stents in the LAD and balloon angioplasty of the diagonal branch. She's been maintained on dual antiplatelet therapy with aspirin and Plavix long term.  She denies any cardiac related complaints. She specifically denies chest pain, shortness of breath, leg swelling, or heart palpitations. She has noted that her blood pressure has become more erratic with some increased blood pressure readings at home.   Past Medical History  Diagnosis Date  . CAD (coronary artery disease) 2008    status post anterior myocardial infarction, treated w/ a dru-eluting stent to the LAD  . GERD (gastroesophageal reflux disease)   . ALLERGIC RHINITIS 04/08/2008  . CAD, NATIVE VESSEL 11/10/2009  . HYPERLIPIDEMIA 03/29/2008  . HYPERTENSION 03/29/2008  . Hiatal hernia   . Allergy   . Basal cell carcinoma     back  . Myocardial infarction ([place])     2008  . Osteopenia     Past Surgical History  Procedure Laterality Date  . Percutaneous transluminal coronary angioplasty and drug-eluting  09/2007    Stent placement in the left anterior descending  . Appendectomy    . Basal cell carcinoma excision      back    Current Outpatient Prescriptions  Medication Sig Dispense Refill  . aspirin 81 MG tablet Take 81 mg by mouth daily.      Marland Kitchen atorvastatin (LIPITOR) 80 MG tablet TAKE ONE TABLET BY MOUTH ONCE DAILY OR  AS  DIRECTED 90 tablet 2  . carvedilol (COREG) 25  MG tablet TAKE ONE TABLET BY MOUTH TWICE DAILY 180 tablet 2  . clopidogrel (PLAVIX) 75 MG tablet Take 1 tablet (75 mg total) by mouth daily. 90 tablet 1  . losartan (COZAAR) 100 MG tablet Take 1 tablet (100 mg total) by mouth daily. 90 tablet 1  . nitroGLYCERIN (NITROSTAT) 0.4 MG SL tablet Place 1 tablet (0.4 mg total) under the tongue every 5 (five) minutes as needed for chest pain. 25 tablet 2  . omeprazole (PRILOSEC) 20 MG capsule TAKE ONE CAPSULE BY MOUTH ONCE DAILY 90 capsule 1   No current facility-administered medications for this visit.    Allergies:   Penicillins   Social History:  The patient  reports that she has never smoked. She has never used smokeless tobacco. She reports that she does not drink alcohol or use illicit drugs.   Family History:  The patient's  family history includes CAD in her son; Coronary artery disease in her maternal uncle and mother; Heart disease in her brother; Lung cancer in her father. There is no history of Colon cancer, Esophageal cancer, Rectal cancer, or Stomach cancer.    ROS:  Please see the history of present illness.  All other systems are reviewed and negative.    PHYSICAL EXAM: VS:  BP 170/90 mmHg  Pulse 60  Ht 5' 5.5" (1.664 m)  Wt 150 lb 6.4 oz (68.221 kg)  BMI 24.64 kg/m2 ,  BMI Body mass index is 24.64 kg/(m^2). GEN: Well nourished, well developed, in no acute distress HEENT: normal Neck: no JVD, no masses. No carotid bruits Cardiac: RRR without murmur or gallop                Respiratory:  clear to auscultation bilaterally, normal work of breathing GI: soft, nontender, nondistended, + BS MS: no deformity or atrophy Ext: no pretibial edema, pedal pulses 2+= bilaterally Skin: warm and dry, no rash Neuro:  Strength and sensation are intact Psych: euthymic mood, full affect  EKG:  EKG is ordered today. The ekg ordered today shows normal sinus rhythm 61 bpm, nonspecific ST abnormality.  Recent Labs: 05/03/2015: ALT 12; BUN 20;  Creatinine, Ser 0.77; Hemoglobin 13.2; Platelets 233.0; Potassium 4.2; Sodium 142; TSH 3.25   Lipid Panel     Component Value Date/Time   CHOL 164 05/03/2015 0914   TRIG 90.0 05/03/2015 0914   HDL 54.30 05/03/2015 0914   CHOLHDL 3 05/03/2015 0914   VLDL 18.0 05/03/2015 0914   LDLCALC 92 05/03/2015 0914      Wt Readings from Last 3 Encounters:  01/04/16 150 lb 6.4 oz (68.221 kg)  05/03/15 156 lb (70.761 kg)  12/02/14 155 lb (70.308 kg)    ASSESSMENT AND PLAN: 1.  Essential hypertension, uncontrolled: Reviewed medications. Recommended and hydrochlorothiazide 25 mg daily. Check a metabolic panel in 2 weeks. Continue to monitor blood pressure at home. I will see her back next year. She follows a good diet and avoids sodium.  2. CAD, native vessel, without angina: Continue aspirin, Plavix, and secondary risk reduction measures.  3. Hyperlipidemia: Most recent lipids reviewed. She remains on atorvastatin 80 mg daily.  Current medicines are reviewed with the patient today.  The patient does not have concerns regarding medicines.  Labs/ tests ordered today include:  No orders of the defined types were placed in this encounter.   Disposition:   FU one year  Signed, Sherren Mocha, MD  01/04/2016 2:57 PM    [facility] [address], [place], [place]  [address] Phone: [phone]; Fax: [phone]

## 2016-01-04 NOTE — Patient Instructions (Signed)
Medication Instructions:  Your physician has recommended you make the following change in your medication:  1. START HCTZ 25mg  take one tablet by mouth daily  Labwork: Your physician recommends that you return for lab work in: 2 WEEKS (BMP)  Testing/Procedures: No new orders.   Follow-Up: Your physician wants you to follow-up in: 1 YEAR with Dr Burt Knack.  You will receive a reminder letter in the mail two months in advance. If you don't receive a letter, please call our office to schedule the follow-up appointment.   Any Other Special Instructions Will Be Listed Below (If Applicable).     If you need a refill on your cardiac medications before your next appointment, please call your pharmacy.

## 2016-01-18 ENCOUNTER — Other Ambulatory Visit (INDEPENDENT_AMBULATORY_CARE_PROVIDER_SITE_OTHER): Payer: Commercial Managed Care - HMO | Admitting: *Deleted

## 2016-01-18 DIAGNOSIS — I2510 Atherosclerotic heart disease of native coronary artery without angina pectoris: Secondary | ICD-10-CM

## 2016-01-18 DIAGNOSIS — I10 Essential (primary) hypertension: Secondary | ICD-10-CM | POA: Diagnosis not present

## 2016-01-18 LAB — BASIC METABOLIC PANEL
BUN: 25 mg/dL (ref 7–25)
CHLORIDE: 97 mmol/L — AB (ref 98–110)
CO2: 24 mmol/L (ref 20–31)
CREATININE: 1.29 mg/dL — AB (ref 0.60–0.93)
Calcium: 9.4 mg/dL (ref 8.6–10.4)
Glucose, Bld: 83 mg/dL (ref 65–99)
POTASSIUM: 3.8 mmol/L (ref 3.5–5.3)
Sodium: 138 mmol/L (ref 135–146)

## 2016-01-19 ENCOUNTER — Telehealth: Payer: Self-pay | Admitting: *Deleted

## 2016-01-19 DIAGNOSIS — I2510 Atherosclerotic heart disease of native coronary artery without angina pectoris: Secondary | ICD-10-CM

## 2016-01-19 DIAGNOSIS — I10 Essential (primary) hypertension: Secondary | ICD-10-CM

## 2016-01-19 NOTE — Telephone Encounter (Signed)
Notes Recorded by Sherren Mocha, MD on 01/19/2016  Labs reviewed. Creatinine has always been normal in past. Advise push fluids, reduce HCTZ to 12.5 mg daily, and repeat labs in 4 weeks. thx

## 2016-01-23 ENCOUNTER — Other Ambulatory Visit: Payer: Self-pay | Admitting: Cardiovascular Disease

## 2016-01-24 ENCOUNTER — Other Ambulatory Visit: Payer: Self-pay | Admitting: Cardiovascular Disease

## 2016-02-12 ENCOUNTER — Encounter: Payer: Self-pay | Admitting: Gastroenterology

## 2016-02-16 ENCOUNTER — Other Ambulatory Visit (INDEPENDENT_AMBULATORY_CARE_PROVIDER_SITE_OTHER): Payer: Commercial Managed Care - HMO | Admitting: *Deleted

## 2016-02-16 DIAGNOSIS — I2510 Atherosclerotic heart disease of native coronary artery without angina pectoris: Secondary | ICD-10-CM | POA: Diagnosis not present

## 2016-02-16 DIAGNOSIS — I10 Essential (primary) hypertension: Secondary | ICD-10-CM

## 2016-02-16 LAB — BASIC METABOLIC PANEL
BUN: 21 mg/dL (ref 7–25)
CHLORIDE: 102 mmol/L (ref 98–110)
CO2: 28 mmol/L (ref 20–31)
Calcium: 9 mg/dL (ref 8.6–10.4)
Creat: 1.21 mg/dL — ABNORMAL HIGH (ref 0.60–0.93)
Glucose, Bld: 91 mg/dL (ref 65–99)
POTASSIUM: 3.9 mmol/L (ref 3.5–5.3)
Sodium: 139 mmol/L (ref 135–146)

## 2016-02-23 ENCOUNTER — Ambulatory Visit: Payer: Commercial Managed Care - HMO | Admitting: Cardiovascular Disease

## 2016-05-07 ENCOUNTER — Ambulatory Visit (INDEPENDENT_AMBULATORY_CARE_PROVIDER_SITE_OTHER): Payer: Commercial Managed Care - HMO | Admitting: Family Medicine

## 2016-05-07 ENCOUNTER — Encounter: Payer: Self-pay | Admitting: Family Medicine

## 2016-05-07 VITALS — BP 122/72 | HR 60 | Temp 97.7°F | Ht 64.0 in | Wt 148.0 lb

## 2016-05-07 DIAGNOSIS — Z0000 Encounter for general adult medical examination without abnormal findings: Secondary | ICD-10-CM | POA: Diagnosis not present

## 2016-05-07 DIAGNOSIS — E785 Hyperlipidemia, unspecified: Secondary | ICD-10-CM

## 2016-05-07 DIAGNOSIS — Z85828 Personal history of other malignant neoplasm of skin: Secondary | ICD-10-CM | POA: Diagnosis not present

## 2016-05-07 LAB — COMPREHENSIVE METABOLIC PANEL
ALT: 12 U/L (ref 0–35)
AST: 15 U/L (ref 0–37)
Albumin: 4.3 g/dL (ref 3.5–5.2)
Alkaline Phosphatase: 97 U/L (ref 39–117)
BUN: 21 mg/dL (ref 6–23)
CALCIUM: 9.6 mg/dL (ref 8.4–10.5)
CHLORIDE: 105 meq/L (ref 96–112)
CO2: 28 meq/L (ref 19–32)
Creatinine, Ser: 0.96 mg/dL (ref 0.40–1.20)
GFR: 60.11 mL/min (ref >60.00)
Glucose, Bld: 100 mg/dL — ABNORMAL HIGH (ref 70–99)
POTASSIUM: 4.3 meq/L (ref 3.5–5.1)
Sodium: 142 mEq/L (ref 135–145)
Total Bilirubin: 0.5 mg/dL (ref 0.2–1.2)
Total Protein: 7 g/dL (ref 6.0–8.3)

## 2016-05-07 LAB — LIPID PANEL
CHOL/HDL RATIO: 4
Cholesterol: 173 mg/dL (ref 0–200)
HDL: 48 mg/dL (ref >39.00)
LDL CALC: 111 mg/dL — AB (ref 0–99)
NonHDL: 125.29
TRIGLYCERIDES: 69 mg/dL (ref 0.0–149.0)
VLDL: 13.8 mg/dL (ref 0.0–40.0)

## 2016-05-07 LAB — CBC
HCT: 40.1 % (ref 36.0–46.0)
HEMOGLOBIN: 13.4 g/dL (ref 12.0–15.0)
MCHC: 33.3 g/dL (ref 30.0–36.0)
MCV: 93.8 fl (ref 78.0–100.0)
PLATELETS: 253 10*3/uL (ref 150.0–400.0)
RBC: 4.27 Mil/uL (ref 3.87–5.11)
RDW: 14.7 % (ref 11.5–15.5)
WBC: 9.1 10*3/uL (ref 4.0–10.5)

## 2016-05-07 LAB — POC URINALSYSI DIPSTICK (AUTOMATED)
BILIRUBIN UA: NEGATIVE
Blood, UA: NEGATIVE
GLUCOSE UA: NEGATIVE
Ketones, UA: NEGATIVE
NITRITE UA: NEGATIVE
PH UA: 7
SPEC GRAV UA: 1.02
Urobilinogen, UA: 1

## 2016-05-07 MED ORDER — RANITIDINE HCL 150 MG PO CAPS: 150.0000 mg | ORAL_CAPSULE | Freq: Two times a day (BID) | ORAL | Status: DC

## 2016-05-07 NOTE — Patient Instructions (Addendum)
Stop by lab before you leave  Trial full pill of atorvastatin- if you cannot tolerate it, we can go back to 1/2 tablet (muscle aches usually biggest side effect)  Trial zantac instead of prilosec  Placed referral for Dr. Allyson Sabal  If labs completely stable will not changed meds. May consider stopping hctz 1/2 pill if any further changes in kidneys  I would also like for you to sign up for an annual wellness visit on a Friday with our nurse Manuela Schwartz. This is a free benefit under medicare that may help [place] find additional ways to help you.

## 2016-05-07 NOTE — Progress Notes (Signed)
Phone: [phone]  Subjective:  Patient presents today for their annual physical. Chief complaint-noted.   See problem oriented charting- ROS- full  review of systems was completed and negative including No chest pain or shortness of breath. No headache or blurry vision.   The following were reviewed and entered/updated in epic: Past Medical History  Diagnosis Date  . CAD (coronary artery disease) 2008    status post anterior myocardial infarction, treated w/ a dru-eluting stent to the LAD  . GERD (gastroesophageal reflux disease)   . ALLERGIC RHINITIS 04/08/2008  . CAD, NATIVE VESSEL 11/10/2009  . HYPERLIPIDEMIA 03/29/2008  . HYPERTENSION 03/29/2008  . Hiatal hernia   . Allergy   . Basal cell carcinoma     back  . Myocardial infarction ([place])     2008  . Osteopenia    Patient Active Problem List   Diagnosis Date Noted  . CAD, NATIVE VESSEL 11/10/2009    Priority: High  . Osteoporosis     Priority: Medium  . Hyperlipidemia 03/29/2008    Priority: Medium  . Essential hypertension 03/29/2008    Priority: Medium  . Basal cell carcinoma     Priority: Low  . GERD 05/05/2008    Priority: Low  . Allergic rhinitis 04/08/2008    Priority: Low   Past Surgical History  Procedure Laterality Date  . Percutaneous transluminal coronary angioplasty and drug-eluting  09/2007    Stent placement in the left anterior descending  . Appendectomy    . Basal cell carcinoma excision      back    Family History  Problem Relation Age of Onset  . Coronary artery disease Mother   . Coronary artery disease Maternal Uncle   . Lung cancer Father   . Heart disease Brother     stent placed  . Colon cancer Neg Hx   . Esophageal cancer Neg Hx   . Rectal cancer Neg Hx   . Stomach cancer Neg Hx   . CAD Son     died 86 of MI    Medications- reviewed and updated Current Outpatient Prescriptions  Medication Sig Dispense Refill  . aspirin 81 MG tablet Take 81 mg by mouth daily.      Marland Kitchen  atorvastatin (LIPITOR) 80 MG tablet TAKE ONE TABLET BY MOUTH ONCE DAILY OR  AS  DIRECTED 90 tablet 2  . carvedilol (COREG) 25 MG tablet TAKE ONE TABLET BY MOUTH TWICE DAILY 180 tablet 2  . clopidogrel (PLAVIX) 75 MG tablet TAKE ONE TABLET BY MOUTH ONCE DAILY 90 tablet 3  . hydrochlorothiazide (HYDRODIURIL) 25 MG tablet 1/2 tablet (12.5mg ) by mouth daily    . losartan (COZAAR) 100 MG tablet TAKE ONE TABLET BY MOUTH ONCE DAILY 90 tablet 3  . nitroGLYCERIN (NITROSTAT) 0.4 MG SL tablet Place 1 tablet (0.4 mg total) under the tongue every 5 (five) minutes as needed for chest pain. 25 tablet 2  . omeprazole (PRILOSEC) 20 MG capsule TAKE ONE CAPSULE BY MOUTH ONCE DAILY 90 capsule 1   Allergies-reviewed and updated Allergies  Allergen Reactions  . Penicillins     REACTION: rash    Social History   Social History  . Marital Status: Married    Spouse Name: N/A  . Number of Children: 4  . Years of Education: N/A   Occupational History  . retired    Social History Main Topics  . Smoking status: Never Smoker   . Smokeless tobacco: Never Used  . Alcohol Use: No  .  Drug Use: No  . Sexual Activity: Not Asked   Other Topics Concern  . None   Social History Narrative   Married 1970. 2 living children. 4 children (lost one at age 32 drug related to heart attack and one at 37 to heart attack not drug related), 6 grandkids. 2 greatgrandkids with 1 on the way March 2016.       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 Oropharynx normal Neck: no thyromegaly CV: RRR no murmurs rubs or gallops Lungs: CTAB no crackles, wheeze, rhonchi Abdomen: soft/nontender/nondistended/normal bowel sounds. No rebound or guarding.  Ext: no edema Skin: warm,  dry Neuro: grossly normal, moves all extremities, PERRLA  Assessment/Plan:  75 y.o. female presenting for annual physical.  Health Maintenance counseling: 1. Anticipatory guidance: Patient counseled regarding regular dental exams, eye exams, wearing seatbelts.  2. Risk factor reduction:  Advised patient of need for regular exercise and diet rich and fruits and vegetables to reduce risk of heart attack and stroke. Plans to start walking 3. Immunizations/screenings/ancillary studies Immunization History  Administered Date(s) Administered  . Influenza Split 09/27/2011, 10/13/2012  . Influenza Whole 09/29/2008, 09/27/2009, 10/04/2010  . Influenza, High Dose Seasonal PF 09/24/2013, 10/04/2014, 09/28/2015  . Pneumococcal Conjugate-13 12/02/2014  . Pneumococcal Polysaccharide-23 03/29/2008  . Td 03/29/2008  . Tdap 12/02/2014  . Zoster 02/23/2008   4. Cervical cancer screening- aged out, no history abnormal  5. Breast cancer screening-  bre[facility] exam at [facility] 05/02/14- wants to repeat this year 6. Colon cancer screening - 12/09/2008 normal- we will discuss in 2019 7. Skin cancer screening- needs referral to Dr. Allyson Sabal  Status of chronic or acute concerns   1. CAD- follows with Dr. Burt Knack. Compliant with aspirin, atorvastatin, plavix. Stent in 2008 with history MI.   2. Hyperlipidemia- compliant with atorvastatin with last LDL 92- has only been taking 40mg , we are going to trial 80mg  to see if she can tolerate it- if so will recheck next visit  Lab Results  Component Value Date   CHOL 164 05/03/2015   HDL 54.30 05/03/2015   LDLCALC 92 05/03/2015   TRIG 90.0 05/03/2015   CHOLHDL 3 05/03/2015   3. Hypertension- controlled on losartan 100mg , carvedilol 25mg  BID, hctz 25mg  (only taking 1/2 tablet) At home was 122/60 this morning BP Readings from Last 3 Encounters:  05/07/16 122/72  01/04/16 170/90  05/03/15 138/80   4. Osteoporosis managed by Dr[facility] Radene Knee at  [facility]. Vitamin D only. Plan per patient and gyn was to remain off fosamax  5. GERD- controlled on omeprazole, trial of zantac to reduce potential long term SE   No problem-specific assessment & plan notes found for this encounter.   No Follow-up on file. Return precautions advised.   No orders of the defined types were placed in this encounter.    No orders of the defined types were placed in this encounter.    Garret Reddish, MD

## 2016-06-26 DIAGNOSIS — L570 Actinic keratosis: Secondary | ICD-10-CM | POA: Diagnosis not present

## 2016-06-26 DIAGNOSIS — L821 Other seborrheic keratosis: Secondary | ICD-10-CM | POA: Diagnosis not present

## 2016-06-26 DIAGNOSIS — Z85828 Personal history of other malignant neoplasm of skin: Secondary | ICD-10-CM | POA: Diagnosis not present

## 2016-06-26 DIAGNOSIS — D1801 Hemangioma of skin and subcutaneous tissue: Secondary | ICD-10-CM | POA: Diagnosis not present

## 2016-06-26 DIAGNOSIS — L814 Other melanin hyperpigmentation: Secondary | ICD-10-CM | POA: Diagnosis not present

## 2016-06-26 DIAGNOSIS — D235 Other benign neoplasm of skin of trunk: Secondary | ICD-10-CM | POA: Diagnosis not present

## 2016-09-23 ENCOUNTER — Telehealth: Payer: Self-pay | Admitting: Family Medicine

## 2016-09-23 NOTE — Telephone Encounter (Signed)
Patient is requesting a [facility] referral to [facility] eye care. Has an appt set for 10/14/2016

## 2016-09-27 ENCOUNTER — Ambulatory Visit (INDEPENDENT_AMBULATORY_CARE_PROVIDER_SITE_OTHER): Payer: Commercial Managed Care - HMO | Admitting: Family Medicine

## 2016-09-27 DIAGNOSIS — Z23 Encounter for immunization: Secondary | ICD-10-CM | POA: Diagnosis not present

## 2016-09-27 NOTE — Progress Notes (Signed)
a 

## 2016-10-14 DIAGNOSIS — H2513 Age-related nuclear cataract, bilateral: Secondary | ICD-10-CM | POA: Diagnosis not present

## 2017-01-01 ENCOUNTER — Other Ambulatory Visit: Payer: Self-pay | Admitting: Family Medicine

## 2017-01-20 ENCOUNTER — Ambulatory Visit (INDEPENDENT_AMBULATORY_CARE_PROVIDER_SITE_OTHER): Payer: Medicare HMO | Admitting: Cardiovascular Disease

## 2017-01-20 ENCOUNTER — Encounter: Payer: Self-pay | Admitting: Cardiovascular Disease

## 2017-01-20 VITALS — BP 137/60 | HR 64 | Ht 65.0 in | Wt 148.0 lb

## 2017-01-20 DIAGNOSIS — I10 Essential (primary) hypertension: Secondary | ICD-10-CM | POA: Diagnosis not present

## 2017-01-20 DIAGNOSIS — I2510 Atherosclerotic heart disease of native coronary artery without angina pectoris: Secondary | ICD-10-CM | POA: Diagnosis not present

## 2017-01-20 NOTE — Progress Notes (Signed)
Cardiology Office Note Date:  01/20/2017   ID:  Jennifer Gross, DOB [birth year], MRN [id number]  PCP:  Garret Reddish, MD  Cardiologist:  Sherren Mocha, MD    Chief Complaint  Patient presents with  . Benign essential HTN   History of Present Illness: Jennifer Gross is a 76 y.o. female who presents for follow-up evaluation. The patient is followed for coronary artery disease and initially presented in 2008 with an anterior wall infarction. She was treated with overlapping drug-eluting stents in the LAD and balloon angioplasty of the diagonal branch. She's been maintained on dual antiplatelet therapy with aspirin and Plavix long term.  She was last seen in January 2017. At that time her blood pressure was not well-controlled and hydrochlorothiazide was added to her medicine regimen. She returns today for follow-up evaluation. The patient is here alone.  Today, she denies symptoms of palpitations, chest pain, shortness of breath, orthopnea, PND, lower extremity edema, dizziness, or syncope.  Past Medical History:  Diagnosis Date  . ALLERGIC RHINITIS 04/08/2008  . Allergy   . Basal cell carcinoma    back  . CAD (coronary artery disease) 2008   status post anterior myocardial infarction, treated w/ a dru-eluting stent to the LAD  . CAD, NATIVE VESSEL 11/10/2009  . GERD (gastroesophageal reflux disease)   . Hiatal hernia   . HYPERLIPIDEMIA 03/29/2008  . HYPERTENSION 03/29/2008  . Myocardial infarction    2008  . Osteopenia     Past Surgical History:  Procedure Laterality Date  . APPENDECTOMY    . BASAL CELL CARCINOMA EXCISION     back  . Percutaneous transluminal coronary angioplasty and drug-eluting  09/2007   Stent placement in the left anterior descending    Current Outpatient Prescriptions  Medication Sig Dispense Refill  . aspirin 81 MG tablet Take 81 mg by mouth daily.      Marland Kitchen atorvastatin (LIPITOR) 80 MG tablet TAKE ONE TABLET BY MOUTH ONCE DAILY OR AS DIRECTED 90  tablet 2  . carvedilol (COREG) 25 MG tablet TAKE ONE TABLET BY MOUTH TWICE DAILY 180 tablet 2  . hydrochlorothiazide (HYDRODIURIL) 25 MG tablet 1/2 tablet (12.5mg ) by mouth daily    . losartan (COZAAR) 100 MG tablet TAKE ONE TABLET BY MOUTH ONCE DAILY 90 tablet 3  . nitroGLYCERIN (NITROSTAT) 0.4 MG SL tablet Place 1 tablet (0.4 mg total) under the tongue every 5 (five) minutes as needed for chest pain. 25 tablet 2  . ranitidine (ZANTAC) 150 MG capsule Take 1 capsule (150 mg total) by mouth 2 (two) times daily. 180 capsule 3   No current facility-administered medications for this visit.     Allergies:   Penicillins   Social History:  The patient  reports that she has never smoked. She has never used smokeless tobacco. She reports that she does not drink alcohol or use drugs.   Family History:  The patient's  family history includes CAD in her son; Coronary artery disease in her maternal uncle and mother; Heart disease in her brother; Lung cancer in her father.    ROS:  Please see the history of present illness.  Otherwise, review of systems is positive for left shoulder pain.  All other systems are reviewed and negative.    PHYSICAL EXAM: VS:  BP 137/60   Pulse 64   Ht 5\' 5"  (1.651 m)   Wt 148 lb (67.1 kg)   BMI 24.63 kg/m  , BMI Body mass index is 24.63 kg/m. GEN:  Well nourished, well developed, in no acute distress  HEENT: normal  Neck: no JVD, no masses. No carotid bruits Cardiac: RRR without murmur or gallop                Respiratory:  clear to auscultation bilaterally, normal work of breathing GI: soft, nontender, nondistended, + BS MS: no deformity or atrophy  Ext: no pretibial edema, pedal pulses 2+= bilaterally Skin: warm and dry, no rash Neuro:  Strength and sensation are intact Psych: euthymic mood, full affect  EKG:  EKG is ordered today. The ekg ordered today shows NSR 64 bpm, possible age-indeterminate inferior infarct  Recent Labs: 05/07/2016: ALT 12; BUN 21;  Creatinine, Ser 0.96; Hemoglobin 13.4; Platelets 253.0; Potassium 4.3; Sodium 142   Lipid Panel     Component Value Date/Time   CHOL 173 05/07/2016 0856   TRIG 69.0 05/07/2016 0856   HDL 48.00 05/07/2016 0856   CHOLHDL 4 05/07/2016 0856   VLDL 13.8 05/07/2016 0856   LDLCALC 111 (H) 05/07/2016 0856      Wt Readings from Last 3 Encounters:  01/20/17 148 lb (67.1 kg)  05/07/16 148 lb (67.1 kg)  01/04/16 150 lb 6.4 oz (68.2 kg)     Cardiac Studies Reviewed: Cardiac Cath note reviewed from 2008. Overlapping Promus DES implanted in the LAD in setting of AMI.  ASSESSMENT AND PLAN: 1.  Coronary artery disease, native vessel, without angina: now 10 years out from her MI. Stop plavix. Continue ASA, carvedilol, ARB, and statin. FU one year.   2. Hypertension: BP well-controlled on current Rx. Meds and labs reviewed.   3. Hyperlipidemia: LDL was above goal at last check in May 2017. Now back on atorvastatin 80 mg. Will have labs updated at the time of her annual physical exam with Dr Yong Channel in May 2018.   Current medicines are reviewed with the patient today.  The patient does not have concerns regarding medicines.  Labs/ tests ordered today include:   Orders Placed This Encounter  Procedures  . EKG 12-Lead    Disposition:   FU 12 months  Signed, Sherren Mocha, MD  01/20/2017 1:43 PM    [facility] [place], [place], [place]  [address] Phone: [phone]; Fax: [phone]

## 2017-01-20 NOTE — Patient Instructions (Signed)
Medication Instructions:  Your physician has recommended you make the following change in your medication:  1. STOP Plavix (clopidogrel)  Labwork: No new orders.   Testing/Procedures: No new orders.   Follow-Up: Your physician wants you to follow-up in: 1 YEAR with Dr Cooper.  You will receive a reminder letter in the mail two months in advance. If you don't receive a letter, please call our office to schedule the follow-up appointment.   Any Other Special Instructions Will Be Listed Below (If Applicable).     If you need a refill on your cardiac medications before your next appointment, please call your pharmacy.   

## 2017-02-19 ENCOUNTER — Other Ambulatory Visit: Payer: Self-pay | Admitting: Cardiovascular Disease

## 2017-02-19 DIAGNOSIS — I2510 Atherosclerotic heart disease of native coronary artery without angina pectoris: Secondary | ICD-10-CM

## 2017-02-19 DIAGNOSIS — I10 Essential (primary) hypertension: Secondary | ICD-10-CM

## 2017-05-07 ENCOUNTER — Encounter: Payer: Self-pay | Admitting: Family Medicine

## 2017-05-07 ENCOUNTER — Ambulatory Visit (INDEPENDENT_AMBULATORY_CARE_PROVIDER_SITE_OTHER): Payer: Medicare HMO | Admitting: Family Medicine

## 2017-05-07 VITALS — BP 130/72 | HR 66 | Temp 98.1°F | Ht 65.0 in | Wt 146.0 lb

## 2017-05-07 DIAGNOSIS — E7800 Pure hypercholesterolemia, unspecified: Secondary | ICD-10-CM | POA: Diagnosis not present

## 2017-05-07 DIAGNOSIS — I2510 Atherosclerotic heart disease of native coronary artery without angina pectoris: Secondary | ICD-10-CM

## 2017-05-07 DIAGNOSIS — I10 Essential (primary) hypertension: Secondary | ICD-10-CM | POA: Diagnosis not present

## 2017-05-07 DIAGNOSIS — Z0000 Encounter for general adult medical examination without abnormal findings: Secondary | ICD-10-CM

## 2017-05-07 NOTE — Assessment & Plan Note (Signed)
S: follows with Dr. Burt Knack. On aspirin and atorvastatin 80mg . No longer on plavix- was on in the past after stent. asymptomatic A/P: doing well- continue current meds.

## 2017-05-07 NOTE — Assessment & Plan Note (Signed)
S: controlled on Losartan 100mg , carvedilol 25mg  BID, hctz 12.5mg . Unfortunately home blood pressures get as low as 90s to 100s on this and she feels very fatigued with this. When has been off of HCTZ, blood pressure tends to spike BP Readings from Last 3 Encounters:  05/07/17 130/72  01/20/17 137/60  05/07/16 122/72  A/P:Continue current meds: of carvedilol 25mg  BID, hctz 12.5mg  (half of 25mg ). We will reduce losartan to half tablet. Follow up 2-3 weeks. Consider combo pill if this regimen is effective for her

## 2017-05-07 NOTE — Patient Instructions (Signed)
  Jennifer Gross , Thank you for taking time to come for your Medicare Wellness Visit. I appreciate your ongoing commitment to your health goals. Please review the following plan we discussed and let me know if I can assist you in the future.   These are the goals we discussed: 1. Continue with half a pill of hydrochlorothiazide. Also cut your losartan in half.  2. Need to start exercising more regularly. Goal 150 minutes a week. Can build up to this starting with 10-15 minute walks 5 days a week then building to 30 minutes.    This is a list of the screening recommended for you and due dates:  Health Maintenance  Topic Date Due  . Flu Shot  07/23/2017  . Tetanus Vaccine  12/02/2024  . DEXA scan (bone density measurement)  Completed  . Pneumonia vaccines  Completed

## 2017-05-07 NOTE — Progress Notes (Signed)
Phone: [phone]  Subjective:  Patient presents today for their annual wellness visit.    Preventive Screening-Counseling & Management  Smoking Status: Never Smoker Second Hand Smoking status: No smokers in home  Risk Factors Regular exercise: walking about once a week- advised increase to 150 minutes a week Diet: weight very reasonable. Balanced diet. Appetite slightly less with age  Fall Risk: None  Fall Risk  05/07/2017 05/07/2016 05/03/2015  Falls in the past year? No No No    Cardiac risk factors:  advanced age (older than 16 for men, 49 for women)  Hyperlipidemia - yes but on atorvastatin 80mg  No diabetes Hypertension controlled- working on meds Known CAD follows with Dr. Burt Knack   Depression Screen None. PHQ2 0  Depression screen [facility] 2/9 05/07/2017 05/07/2016 05/03/2015  Decreased Interest 0 0 0  Down, Depressed, Hopeless 0 0 0  PHQ - 2 Score 0 0 0    Activities of Daily Living Independent ADLs and IADLs   Hearing Difficulties: -patient declines, other than with whispered voices. Declines hearing testing  Cognitive Testing No reported trouble. 2/3 distracted recall. Will monitor  List the Names of Other Physician/Practitioners you currently use: -Dr. Burt Knack cardiology -Dr. Allyson Sabal dermatology - Dr. Delman Cheadle optho - Dr. Radene Knee- gynecology- every 2 years  Immunization History  Administered Date(s) Administered  . Influenza Split 09/27/2011, 10/13/2012  . Influenza Whole 09/29/2008, 09/27/2009, 10/04/2010  . Influenza, High Dose Seasonal PF 09/24/2013, 10/04/2014, 09/28/2015, 09/27/2016  . Pneumococcal Conjugate-13 12/02/2014  . Pneumococcal Polysaccharide-23 03/29/2008  . Td 03/29/2008  . Tdap 12/02/2014  . Zoster 02/23/2008   Required Immunizations needed today - discussed shingrix, wants to reconsider next year  Screening tests- up to date Pap smears- aged out [place]- will get this set up when sees GYN Bone density- with  GYN Colonoscopy- last in 11/2008- on recall list for next year. Likely last colonoscopy  ROS- No pertinent positives discovered in course of AWV ROS pertinent- No chest pain or shortness of breath. No headache or blurry vision. Fatigue when blood pressure is low  The following were reviewed and entered/updated in epic: Past Medical History:  Diagnosis Date  . ALLERGIC RHINITIS 04/08/2008  . Allergy   . Basal cell carcinoma    back  . CAD (coronary artery disease) 2008   status post anterior myocardial infarction, treated w/ a dru-eluting stent to the LAD  . CAD, NATIVE VESSEL 11/10/2009  . GERD (gastroesophageal reflux disease)   . Hiatal hernia   . HYPERLIPIDEMIA 03/29/2008  . HYPERTENSION 03/29/2008  . Myocardial infarction ([place])    2008  . Osteopenia    Patient Active Problem List   Diagnosis Date Noted  . CAD (coronary artery disease) 11/10/2009    Priority: High  . Osteoporosis     Priority: Medium  . Hyperlipidemia 03/29/2008    Priority: Medium  . Essential hypertension 03/29/2008    Priority: Medium  . History of basal cell carcinoma of skin     Priority: Low  . GERD 05/05/2008    Priority: Low  . Allergic rhinitis 04/08/2008    Priority: Low   Past Surgical History:  Procedure Laterality Date  . APPENDECTOMY    . BASAL CELL CARCINOMA EXCISION     back  . Percutaneous transluminal coronary angioplasty and drug-eluting  09/2007   Stent placement in the left anterior descending    Family History  Problem Relation Age of Onset  . Coronary artery disease Mother   . Coronary artery disease Maternal Uncle   .  Lung cancer Father   . Heart disease Brother        stent placed  . Colon cancer Neg Hx   . Esophageal cancer Neg Hx   . Rectal cancer Neg Hx   . Stomach cancer Neg Hx   . CAD Son        died 40 of MI    Medications- reviewed and updated Current Outpatient Prescriptions  Medication Sig Dispense Refill  . aspirin 81 MG tablet Take 81 mg by mouth  daily.      Marland Kitchen atorvastatin (LIPITOR) 80 MG tablet TAKE ONE TABLET BY MOUTH ONCE DAILY OR AS DIRECTED 90 tablet 2  . carvedilol (COREG) 25 MG tablet TAKE ONE TABLET BY MOUTH TWICE DAILY 180 tablet 2  . hydrochlorothiazide (HYDRODIURIL) 25 MG tablet TAKE ONE TABLET BY MOUTH ONCE DAILY 90 tablet 3  . losartan (COZAAR) 100 MG tablet TAKE ONE TABLET BY MOUTH ONCE DAILY 90 tablet 3  . nitroGLYCERIN (NITROSTAT) 0.4 MG SL tablet Place 1 tablet (0.4 mg total) under the tongue every 5 (five) minutes as needed for chest pain. 25 tablet 2  . ranitidine (ZANTAC) 150 MG capsule Take 1 capsule (150 mg total) by mouth 2 (two) times daily. 180 capsule 3   No current facility-administered medications for this visit.     Allergies-reviewed and updated Allergies  Allergen Reactions  . Penicillins     REACTION: rash    Social History   Social History  . Marital status: Married    Spouse name: N/A  . Number of children: 4  . Years of education: N/A   Occupational History  . retired    Social History Main Topics  . Smoking status: Never Smoker  . Smokeless tobacco: Never Used  . Alcohol use No  . Drug use: No  . Sexual activity: Not Asked   Other Topics Concern  . None   Social History Narrative   Married 1970. 2 living children. 4 children (lost one at age 63 drug related to heart attack and one at 51 to heart attack not drug related), 6 grandkids. 2 greatgrandkids with 1 on the way March 2016.       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 See AVS as  well which was given to patient.   Status of chronic or acute concerns   Essential hypertension S: controlled on Losartan 100mg , carvedilol 25mg  BID, hctz 12.5mg . Unfortunately home blood pressures get as low as 90s to 100s on this and she feels very fatigued with this. When has been off of HCTZ, blood pressure tends to spike BP Readings from Last 3 Encounters:  05/07/17 130/72  01/20/17 137/60  05/07/16 122/72  A/P:Continue current meds: of carvedilol 25mg  BID, hctz 12.5mg  (half of 25mg ). We will reduce losartan to half tablet. Follow up 2-3 weeks. Consider combo pill if this regimen is effective for her   CAD (coronary artery disease) S: follows with Dr. Burt Knack. On aspirin and atorvastatin 80mg . No longer on plavix-  was on in the past after stent. asymptomatic A/P: doing well- continue current meds.    Hyperlipidemia S: compliant with atorvastatin 80mg - believe she was off statin at last labs which were not well controlled. No myalgias.  A/P: she is to come fasting next visit and we will update labs at her physical. LDL goal under 70 but on max dose statin- consider zetia?   Return precautions advised. Garret Reddish, MD

## 2017-05-07 NOTE — Assessment & Plan Note (Addendum)
S: compliant with atorvastatin 80mg - believe she was off statin at last labs which were not well controlled. No myalgias.  A/P: she is to come fasting next visit and we will update labs at her physical. LDL goal under 70 but on max dose statin- consider zetia?

## 2017-05-21 ENCOUNTER — Encounter: Payer: Self-pay | Admitting: Family Medicine

## 2017-05-21 ENCOUNTER — Ambulatory Visit (INDEPENDENT_AMBULATORY_CARE_PROVIDER_SITE_OTHER): Payer: Medicare HMO | Admitting: Family Medicine

## 2017-05-21 DIAGNOSIS — E785 Hyperlipidemia, unspecified: Secondary | ICD-10-CM

## 2017-05-21 DIAGNOSIS — I10 Essential (primary) hypertension: Secondary | ICD-10-CM | POA: Diagnosis not present

## 2017-05-21 LAB — LIPID PANEL
CHOL/HDL RATIO: 3
Cholesterol: 137 mg/dL (ref 0–200)
HDL: 48.2 mg/dL (ref >39.00)
LDL Cholesterol: 72 mg/dL (ref 0–99)
NonHDL: 88.42
TRIGLYCERIDES: 81 mg/dL (ref 0.0–149.0)
VLDL: 16.2 mg/dL (ref 0.0–40.0)

## 2017-05-21 LAB — COMPREHENSIVE METABOLIC PANEL
ALT: 12 U/L (ref 0–35)
AST: 15 U/L (ref 0–37)
Albumin: 4.3 g/dL (ref 3.5–5.2)
Alkaline Phosphatase: 87 U/L (ref 39–117)
BILIRUBIN TOTAL: 0.6 mg/dL (ref 0.2–1.2)
BUN: 18 mg/dL (ref 6–23)
CALCIUM: 9.5 mg/dL (ref 8.4–10.5)
CHLORIDE: 105 meq/L (ref 96–112)
CO2: 32 meq/L (ref 19–32)
Creatinine, Ser: 0.85 mg/dL (ref 0.40–1.20)
GFR: 68.98 mL/min (ref >60.00)
Glucose, Bld: 95 mg/dL (ref 70–99)
Potassium: 4 mEq/L (ref 3.5–5.1)
Sodium: 141 mEq/L (ref 135–145)
Total Protein: 6.7 g/dL (ref 6.0–8.3)

## 2017-05-21 LAB — CBC
HCT: 37.4 % (ref 36.0–46.0)
HEMOGLOBIN: 12.5 g/dL (ref 12.0–15.0)
MCHC: 33.3 g/dL (ref 30.0–36.0)
MCV: 91.8 fl (ref 78.0–100.0)
PLATELETS: 243 10*3/uL (ref 150.0–400.0)
RBC: 4.08 Mil/uL (ref 3.87–5.11)
RDW: 13.8 % (ref 11.5–15.5)
WBC: 10.3 10*3/uL (ref 4.0–10.5)

## 2017-05-21 NOTE — Assessment & Plan Note (Signed)
S: compliant with atorvastatin 80mg . Off statin last labs. Denies myalgieas A/P: update lipids today

## 2017-05-21 NOTE — Progress Notes (Signed)
Subjective:  Jennifer Gross is a 76 y.o. year old very pleasant female patient who presents for/with See problem oriented charting ROS- No chest pain or shortness of breath. No headache or blurry vision. Prior fatigue with low blood pressure involved with less medicine.    Past Medical History-  Patient Active Problem List   Diagnosis Date Noted  . CAD (coronary artery disease) 11/10/2009    Priority: High  . Osteoporosis     Priority: Medium  . Hyperlipidemia 03/29/2008    Priority: Medium  . Essential hypertension 03/29/2008    Priority: Medium  . History of basal cell carcinoma of skin     Priority: Low  . GERD 05/05/2008    Priority: Low  . Allergic rhinitis 04/08/2008    Priority: Low    Medications- reviewed and updated Current Outpatient Prescriptions  Medication Sig Dispense Refill  . aspirin 81 MG tablet Take 81 mg by mouth daily.      Marland Kitchen atorvastatin (LIPITOR) 80 MG tablet TAKE ONE TABLET BY MOUTH ONCE DAILY OR AS DIRECTED 90 tablet 2  . carvedilol (COREG) 25 MG tablet TAKE ONE TABLET BY MOUTH TWICE DAILY 180 tablet 2  . hydrochlorothiazide (HYDRODIURIL) 25 MG tablet TAKE ONE TABLET BY MOUTH ONCE DAILY 90 tablet 3  . losartan (COZAAR) 100 MG tablet TAKE ONE TABLET BY MOUTH ONCE DAILY 90 tablet 3  . nitroGLYCERIN (NITROSTAT) 0.4 MG SL tablet Place 1 tablet (0.4 mg total) under the tongue every 5 (five) minutes as needed for chest pain. 25 tablet 2  . ranitidine (ZANTAC) 150 MG capsule Take 1 capsule (150 mg total) by mouth 2 (two) times daily. 180 capsule 3   No current facility-administered medications for this visit.     Objective: BP 134/88 (BP Location: Left Arm, Patient Position: Sitting, Cuff Size: Normal)   Pulse 60   Temp 97.9 F (36.6 C) (Oral)   Ht 5\' 5"  (1.651 m)   Wt 148 lb (67.1 kg)   SpO2 96%   BMI 24.63 kg/m  Gen: NAD, resting comfortably CV: RRR no murmurs rubs or gallops Lungs: CTAB no crackles, wheeze, rhonchi Abdomen: normal weight Ext:  no edema Skin: warm, dry, no rash  Assessment/Plan:  Hyperlipidemia S: compliant with atorvastatin 80mg . Off statin last labs. Denies myalgieas A/P: update lipids today  Essential hypertension S: controlled on losartan 50mg  (half of 100mg  tablet), carvedilol 25mg  BID, hctz 12.5mg  (half of 25mg ). Home blood pressures much better in 110s to 130s- no more fatigued feeling with higher blood pressure. .  ASCVD 10 year risk calculation if age 44-79: CAD history and on statin BP Readings from Last 3 Encounters:  05/21/17 134/88  05/07/17 130/72  01/20/17 137/60  A/P: We discussed blood pressure goal of <140/90. Continue current meds:  Doing much better    Orders Placed This Encounter  Procedures  . CBC    [place]  . Comprehensive metabolic panel    [place]    Order Specific Question:   Has the patient fasted?    Answer:   No  . Lipid panel    [place]    Order Specific Question:   Has the patient fasted?    Answer:   No   Return precautions advised.  Garret Reddish, MD

## 2017-05-21 NOTE — Patient Instructions (Signed)
Blood pressure looks better and glad no side effects  Please stop by lab before you go

## 2017-05-21 NOTE — Assessment & Plan Note (Signed)
S: controlled on losartan 50mg  (half of 100mg  tablet), carvedilol 25mg  BID, hctz 12.5mg  (half of 25mg ). Home blood pressures much better in 110s to 130s- no more fatigued feeling with higher blood pressure. .  ASCVD 10 year risk calculation if age 77-79: CAD history and on statin BP Readings from Last 3 Encounters:  05/21/17 134/88  05/07/17 130/72  01/20/17 137/60  A/P: We discussed blood pressure goal of <140/90. Continue current meds:  Doing much better

## 2017-06-30 DIAGNOSIS — L814 Other melanin hyperpigmentation: Secondary | ICD-10-CM | POA: Diagnosis not present

## 2017-06-30 DIAGNOSIS — L821 Other seborrheic keratosis: Secondary | ICD-10-CM | POA: Diagnosis not present

## 2017-06-30 DIAGNOSIS — Z85828 Personal history of other malignant neoplasm of skin: Secondary | ICD-10-CM | POA: Diagnosis not present

## 2017-06-30 DIAGNOSIS — C44519 Basal cell carcinoma of skin of other part of trunk: Secondary | ICD-10-CM | POA: Diagnosis not present

## 2017-06-30 DIAGNOSIS — D225 Melanocytic nevi of trunk: Secondary | ICD-10-CM | POA: Diagnosis not present

## 2017-06-30 DIAGNOSIS — D485 Neoplasm of uncertain behavior of skin: Secondary | ICD-10-CM | POA: Diagnosis not present

## 2017-06-30 DIAGNOSIS — D1801 Hemangioma of skin and subcutaneous tissue: Secondary | ICD-10-CM | POA: Diagnosis not present

## 2017-07-02 ENCOUNTER — Other Ambulatory Visit: Payer: Self-pay | Admitting: Family Medicine

## 2017-08-28 ENCOUNTER — Ambulatory Visit (INDEPENDENT_AMBULATORY_CARE_PROVIDER_SITE_OTHER): Payer: Medicare HMO

## 2017-08-28 DIAGNOSIS — Z23 Encounter for immunization: Secondary | ICD-10-CM | POA: Diagnosis not present

## 2017-09-01 DIAGNOSIS — L905 Scar conditions and fibrosis of skin: Secondary | ICD-10-CM | POA: Diagnosis not present

## 2017-09-01 DIAGNOSIS — Z85828 Personal history of other malignant neoplasm of skin: Secondary | ICD-10-CM | POA: Diagnosis not present

## 2017-09-09 ENCOUNTER — Encounter: Payer: Self-pay | Admitting: Family Medicine

## 2017-09-09 ENCOUNTER — Ambulatory Visit (INDEPENDENT_AMBULATORY_CARE_PROVIDER_SITE_OTHER): Payer: Medicare HMO | Admitting: Family Medicine

## 2017-09-09 VITALS — BP 130/60 | HR 62 | Temp 98.1°F | Ht 65.0 in | Wt 141.6 lb

## 2017-09-09 DIAGNOSIS — M25561 Pain in right knee: Secondary | ICD-10-CM | POA: Diagnosis not present

## 2017-09-09 MED ORDER — DICLOFENAC SODIUM 1 % TD GEL: 2.0000 g | Freq: Four times a day (QID) | TRANSDERMAL | 1 refills | Status: DC

## 2017-09-09 NOTE — Progress Notes (Signed)
Subjective:  Jennifer Gross is a 76 y.o. year old very pleasant female patient who presents for/with See problem oriented charting ROS- no fall or injury. No leg weakness. Does have knee pain. No warmth or redness- some swelling.    Past Medical History-  Patient Active Problem List   Diagnosis Date Noted  . CAD (coronary artery disease) 11/10/2009    Priority: High  . Osteoporosis     Priority: Medium  . Hyperlipidemia 03/29/2008    Priority: Medium  . Essential hypertension 03/29/2008    Priority: Medium  . History of basal cell carcinoma of skin     Priority: Low  . GERD 05/05/2008    Priority: Low  . Allergic rhinitis 04/08/2008    Priority: Low    Medications- reviewed and updated Current Outpatient Prescriptions  Medication Sig Dispense Refill  . aspirin 81 MG tablet Take 81 mg by mouth daily.      Marland Kitchen atorvastatin (LIPITOR) 80 MG tablet TAKE ONE TABLET BY MOUTH ONCE DAILY OR AS DIRECTED 90 tablet 2  . carvedilol (COREG) 25 MG tablet TAKE ONE TABLET BY MOUTH TWICE DAILY 180 tablet 2  . hydrochlorothiazide (HYDRODIURIL) 25 MG tablet TAKE ONE TABLET BY MOUTH ONCE DAILY 90 tablet 3  . losartan (COZAAR) 100 MG tablet TAKE ONE TABLET BY MOUTH ONCE DAILY 90 tablet 3  . nitroGLYCERIN (NITROSTAT) 0.4 MG SL tablet Place 1 tablet (0.4 mg total) under the tongue every 5 (five) minutes as needed for chest pain. 25 tablet 2  . ranitidine (ZANTAC) 150 MG tablet TAKE ONE TABLET BY MOUTH TWICE DAILY 180 tablet 3  . diclofenac sodium (VOLTAREN) 1 % GEL Apply 2 g topically 4 (four) times daily. 100 g 1   No current facility-administered medications for this visit.     Objective: BP 130/60 (BP Location: Left Arm, Patient Position: Sitting, Cuff Size: Large)   Pulse 62   Temp 98.1 F (36.7 C) (Oral)   Ht 5\' 5"  (1.651 m)   Wt 141 lb 9.6 oz (64.2 kg)   SpO2 95%   BMI 23.56 kg/m  Gen: NAD, resting comfortably CV: RRR no murmurs rubs or gallops Lungs: CTAB no crackles, wheeze,  rhonchi Skin: warm, dry Neuro: good strength, intact distal sensation Knee: Normal to inspection with no erythema. Mild effusion.  Palpation normal with no warmth. Medial joint line tenderness noted.  ROM normal in flexion and extension and lower leg rotation. Ligaments with solid consistent endpoints including ACL, PCL, LCL, MCL. Negative Mcmurray's.  Patellar and quadriceps tendons unremarkable. Hamstring and quadriceps strength is normal.   Assessment/Plan:  Right knee pain S:  symptoms for 2 weeks. No fall or injury. Only thing she did differently was a lot of mowing and having to push the pedal a lot.   Hurts in the back of the knee. Burning like sensation. Never had pain like this before. Up to 9/10 around Friday/saturday- on Thursday did a lot of cleaning around the house. Pain now down to 3/10. Has tried ice and tried to keep weight off of it.   Worse with walking.  A/P: LIkely some underlying OA with flare. Or degenerative meniscal tear. Doubt gout- no erythema or warmth Patient Instructions  Trial voltaren gel if affordable- ask them if makes a difference generic vs. Brand.   I would use voltaren 3x a day for 1-2 weeks. Can use 4x a day if needed. After the week or two can start using just as needed. Use on the  side where the knee pain is.   If not affordable trial Aspercreme if not improving over next 2 weeks come back in and we can trial an injection into the knee  Meds ordered this encounter  Medications  . diclofenac sodium (VOLTAREN) 1 % GEL    Sig: Apply 2 g topically 4 (four) times daily.    Dispense:  100 g    Refill:  1   Return precautions advised.  Garret Reddish, MD

## 2017-09-09 NOTE — Patient Instructions (Addendum)
Trial voltaren gel if affordable- ask them if makes a difference generic vs. Brand.   I would use voltaren 3x a day for 1-2 weeks. Can use 4x a day if needed. After the week or two can start using just as needed. Use on the side where the knee pain is.   If not affordable trial Aspercreme if not improving over next 2 weeks come back in and we can trial an injection into the knee

## 2017-10-07 DIAGNOSIS — H2513 Age-related nuclear cataract, bilateral: Secondary | ICD-10-CM | POA: Diagnosis not present

## 2017-10-09 ENCOUNTER — Ambulatory Visit (INDEPENDENT_AMBULATORY_CARE_PROVIDER_SITE_OTHER): Payer: Medicare HMO | Admitting: Family Medicine

## 2017-10-09 ENCOUNTER — Encounter: Payer: Self-pay | Admitting: Family Medicine

## 2017-10-09 VITALS — BP 128/62 | HR 67 | Temp 97.7°F | Ht 65.0 in | Wt 144.0 lb

## 2017-10-09 DIAGNOSIS — E785 Hyperlipidemia, unspecified: Secondary | ICD-10-CM

## 2017-10-09 DIAGNOSIS — I2510 Atherosclerotic heart disease of native coronary artery without angina pectoris: Secondary | ICD-10-CM | POA: Diagnosis not present

## 2017-10-09 DIAGNOSIS — I10 Essential (primary) hypertension: Secondary | ICD-10-CM

## 2017-10-09 DIAGNOSIS — M25561 Pain in right knee: Secondary | ICD-10-CM

## 2017-10-09 MED ORDER — NITROGLYCERIN 0.4 MG SL SUBL: 0.4000 mg | SUBLINGUAL_TABLET | SUBLINGUAL | 2 refills | Status: DC | PRN

## 2017-10-09 NOTE — Progress Notes (Signed)
Subjective:  Jennifer Gross is a 76 y.o. year old very pleasant female patient who presents for/with See problem oriented charting ROS- right knee pain noted. No chest pain or shortness of breath. No headache or blurry vision.   Past Medical History-  Patient Active Problem List   Diagnosis Date Noted  . CAD (coronary artery disease) 11/10/2009    Priority: High  . Osteoporosis     Priority: Medium  . Hyperlipidemia 03/29/2008    Priority: Medium  . Essential hypertension 03/29/2008    Priority: Medium  . History of basal cell carcinoma of skin     Priority: Low  . GERD 05/05/2008    Priority: Low  . Allergic rhinitis 04/08/2008    Priority: Low    Medications- reviewed and updated Current Outpatient Prescriptions  Medication Sig Dispense Refill  . aspirin 81 MG tablet Take 81 mg by mouth daily.      Marland Kitchen atorvastatin (LIPITOR) 80 MG tablet TAKE ONE TABLET BY MOUTH ONCE DAILY OR AS DIRECTED 90 tablet 2  . carvedilol (COREG) 25 MG tablet TAKE ONE TABLET BY MOUTH TWICE DAILY 180 tablet 2  . diclofenac sodium (VOLTAREN) 1 % GEL Apply 2 g topically 4 (four) times daily. 100 g 1  . hydrochlorothiazide (HYDRODIURIL) 25 MG tablet TAKE ONE TABLET BY MOUTH ONCE DAILY 90 tablet 3  . losartan (COZAAR) 100 MG tablet TAKE ONE TABLET BY MOUTH ONCE DAILY 90 tablet 3  . nitroGLYCERIN (NITROSTAT) 0.4 MG SL tablet Place 1 tablet (0.4 mg total) under the tongue every 5 (five) minutes as needed for chest pain. 25 tablet 2  . ranitidine (ZANTAC) 150 MG tablet TAKE ONE TABLET BY MOUTH TWICE DAILY 180 tablet 3   No current facility-administered medications for this visit.     Objective: BP 128/62 (BP Location: Left Arm, Patient Position: Sitting, Cuff Size: Large)   Pulse 67   Temp 97.7 F (36.5 C) (Oral)   Ht 5\' 5"  (1.651 m)   Wt 144 lb (65.3 kg)   SpO2 96%   BMI 23.96 kg/m  Gen: NAD, resting comfortably CV: RRR no murmurs rubs or gallops Lungs: CTAB no crackles, wheeze, rhonchi Abdomen:  soft/nontender/nondistended/normal bowel sounds. No rebound or guarding.  Ext: no edema Skin: warm, dry Effusion appears slightly worse in right knee. Now with medial joint line tenderness. No obvious ligamentous damage.   Assessment/Plan:  Right knee pain, unspecified chronicity - Plan: Ambulatory referral to Sports Medicine S:  now 6 weeks of symptoms. Got off the riding mower and noted the pain and swelling at onset.. Hurting on medial side of left knee now- prior was posterior. No calf pain. No swelling in calf.. voltaren  Gel helps some A/P: effusion seems slightly worse- mild last visit. Will refer to Dr. Paulla Fore given lack of improvement and mild worsening.   Hyperlipidemia S: well controlled on atorvastatin 80mg . No myalgias.  Lab Results  Component Value Date   CHOL 137 05/21/2017   HDL 48.20 05/21/2017   LDLCALC 72 05/21/2017   TRIG 81.0 05/21/2017   CHOLHDL 3 05/21/2017   A/P: LDL reasonable at 72 - continue current meds. Prefer under 70 but on max dose statin  Essential hypertension S: controlled on losartan 50mg , coreg 25mg  BID, hctz 12.5mg . Home #s remain very well controlled. .  BP Readings from Last 3 Encounters:  10/09/17 128/62  09/09/17 130/60  05/21/17 134/88  A/P: We discussed blood pressure goal of <140/90. Continue current meds  CAD (coronary artery  disease) S: stent in 2008. Follows with Dr. Burt Knack. Compliant with aspirin and atorvastatin. Nitroglycerin expired without use. asymptomatic A/P: gave new rx for nitroglycerin. Continue aspirin, statin   Future Appointments Date Time Provider [place]  10/10/2017 10:40 AM Gerda Diss, DO [facility] None  05/11/2018 9:30 AM Marin Olp, MD [facility] None   Return in about 7 months (around 05/09/2018) for physical.  Orders Placed This Encounter  Procedures  . Ambulatory referral to Sports Medicine    Referral Priority:   Routine    Referral Type:   Consultation    Referred to Provider:    Gerda Diss, DO    Number of Visits Requested:   1   Meds ordered this encounter  Medications  . nitroGLYCERIN (NITROSTAT) 0.4 MG SL tablet    Sig: Place 1 tablet (0.4 mg total) under the tongue every 5 (five) minutes as needed for chest pain.    Dispense:  25 tablet    Refill:  2   Return precautions advised.  Garret Reddish, MD

## 2017-10-09 NOTE — Patient Instructions (Addendum)
Stop by the desk and schedule a visit with Dr. Paulla Fore- looks like he has some openings as soon as tomorrow  Otherwise things are going well

## 2017-10-09 NOTE — Assessment & Plan Note (Signed)
S: controlled on losartan 50mg , coreg 25mg  BID, hctz 12.5mg . Home #s remain very well controlled. .  BP Readings from Last 3 Encounters:  10/09/17 128/62  09/09/17 130/60  05/21/17 134/88  A/P: We discussed blood pressure goal of <140/90. Continue current meds

## 2017-10-09 NOTE — Assessment & Plan Note (Signed)
S: stent in 2008. Follows with Dr. Burt Knack. Compliant with aspirin and atorvastatin. Nitroglycerin expired without use. asymptomatic A/P: gave new rx for nitroglycerin. Continue aspirin, statin

## 2017-10-09 NOTE — Assessment & Plan Note (Signed)
S: well controlled on atorvastatin 80mg . No myalgias.  Lab Results  Component Value Date   CHOL 137 05/21/2017   HDL 48.20 05/21/2017   LDLCALC 72 05/21/2017   TRIG 81.0 05/21/2017   CHOLHDL 3 05/21/2017   A/P: LDL reasonable at 72 - continue current meds. Prefer under 70 but on max dose statin

## 2017-10-10 ENCOUNTER — Encounter: Payer: Self-pay | Admitting: Sports Medicine

## 2017-10-10 ENCOUNTER — Ambulatory Visit (INDEPENDENT_AMBULATORY_CARE_PROVIDER_SITE_OTHER): Payer: Medicare HMO | Admitting: Sports Medicine

## 2017-10-10 ENCOUNTER — Ambulatory Visit (INDEPENDENT_AMBULATORY_CARE_PROVIDER_SITE_OTHER): Payer: Medicare HMO

## 2017-10-10 ENCOUNTER — Ambulatory Visit: Payer: Self-pay

## 2017-10-10 VITALS — BP 148/80 | HR 63 | Ht 65.0 in | Wt 143.4 lb

## 2017-10-10 DIAGNOSIS — M25461 Effusion, right knee: Secondary | ICD-10-CM | POA: Diagnosis not present

## 2017-10-10 DIAGNOSIS — G8929 Other chronic pain: Secondary | ICD-10-CM | POA: Diagnosis not present

## 2017-10-10 DIAGNOSIS — M25561 Pain in right knee: Secondary | ICD-10-CM | POA: Diagnosis not present

## 2017-10-10 NOTE — Patient Instructions (Signed)

## 2017-10-10 NOTE — Progress Notes (Signed)
OFFICE VISIT NOTE [name]. [place]  FLORANCE PAOLILLO - 76 y.o. female MRN [id number]  Date of birth: 1941/06/22  Visit Date: 10/10/2017  PCP: Marin Olp, MD   Referred by: Marin Olp, MD  Thalia Bloodgood PT, LAT, ATC acting as scribe for Dr. Paulla Fore.  SUBJECTIVE:   Chief Complaint  Patient presents with  . New Patient (Initial Visit)     R knee pain and swelling   HPI: As below and per problem based documentation when appropriate.  Ms. Araque is a new pt presenting today w/ c/o R knee pain and swelling x approximately 6 weeks.  She was most recently seen by Dr. Yong Channel yesterday (10/09/17).  She states that she recalls no specific MOI.  Pt rates the R knee pain as a 3/10, aching/throbbing pain.  Pt states that the Voltaren gel and Tylenol help alleviate the pain but nothing completely eliminates her R knee pain.  Pt states that full R knee extension increases her R knee pain.  She notes that the pain began along her R post knee and then moved to the ant/med aspect of her R knee.  She locates her current pain to the medial aspect of her R knee.       Review of Systems  Constitutional: Negative for chills and fever.  Eyes: Negative for blurred vision.  Respiratory: Negative for shortness of breath.   Cardiovascular: Negative for chest pain.  Musculoskeletal: Positive for joint pain.  Neurological: Negative for headaches.    Otherwise per HPI.  HISTORY & PERTINENT PRIOR DATA:  No specialty comments available. She reports that she has never smoked. She has never used smokeless tobacco. No results for input(s): HGBA1C, LABURIC in the last 8760 hours. Allergies reviewed per EMR Prior to Admission medications   Medication Sig Start Date End Date Taking? Authorizing Provider  aspirin 81 MG tablet Take 81 mg by mouth daily.     Yes [provider]  atorvastatin (LIPITOR) 80 MG tablet  TAKE ONE TABLET BY MOUTH ONCE DAILY OR AS DIRECTED 01/01/17  Yes Marin Olp, MD  carvedilol (COREG) 25 MG tablet TAKE ONE TABLET BY MOUTH TWICE DAILY 01/01/17  Yes Marin Olp, MD  diclofenac sodium (VOLTAREN) 1 % GEL Apply 2 g topically 4 (four) times daily. 09/09/17  Yes Marin Olp, MD  hydrochlorothiazide (HYDRODIURIL) 25 MG tablet TAKE ONE TABLET BY MOUTH ONCE DAILY 02/19/17  Yes Sherren Mocha, MD  losartan (COZAAR) 100 MG tablet TAKE ONE TABLET BY MOUTH ONCE DAILY 02/19/17  Yes Sherren Mocha, MD  nitroGLYCERIN (NITROSTAT) 0.4 MG SL tablet Place 1 tablet (0.4 mg total) under the tongue every 5 (five) minutes as needed for chest pain. 10/09/17  Yes Marin Olp, MD  ranitidine (ZANTAC) 150 MG tablet TAKE ONE TABLET BY MOUTH TWICE DAILY 07/03/17  Yes Marin Olp, MD   Patient Active Problem List   Diagnosis Date Noted  . Chronic pain of right knee 10/12/2017  . History of basal cell carcinoma of skin   . Osteoporosis   . CAD (coronary artery disease) 11/10/2009  . GERD 05/05/2008  . Allergic rhinitis 04/08/2008  . Hyperlipidemia 03/29/2008  . Essential hypertension 03/29/2008   Past Medical History:  Diagnosis Date  . ALLERGIC RHINITIS 04/08/2008  . Allergy   . Basal cell carcinoma    back  . CAD (coronary artery disease) 2008  status post anterior myocardial infarction, treated w/ a dru-eluting stent to the LAD  . CAD, NATIVE VESSEL 11/10/2009  . GERD (gastroesophageal reflux disease)   . Hiatal hernia   . HYPERLIPIDEMIA 03/29/2008  . HYPERTENSION 03/29/2008  . Myocardial infarction ([place])    2008  . Osteopenia    Family History  Problem Relation Age of Onset  . Coronary artery disease Mother   . Coronary artery disease Maternal Uncle   . Lung cancer Father   . Heart disease Brother        stent placed  . Colon cancer Neg Hx   . Esophageal cancer Neg Hx   . Rectal cancer Neg Hx   . Stomach cancer Neg Hx   . CAD Son        died 56 of MI    Past Surgical History:  Procedure Laterality Date  . APPENDECTOMY    . BASAL CELL CARCINOMA EXCISION     back  . Percutaneous transluminal coronary angioplasty and drug-eluting  09/2007   Stent placement in the left anterior descending   Social History   Occupational History  . retired    Social History Main Topics  . Smoking status: Never Smoker  . Smokeless tobacco: Never Used  . Alcohol use No  . Drug use: No  . Sexual activity: Not on file    OBJECTIVE:  VS:  HT:5\' 5"  (165.1 cm)   WT:143 lb 6.4 oz (65 kg)  BMI:23.86    BP:(!) 148/80  HR:63bpm  TEMP: ( )  RESP:95 % EXAM: Findings:  WDWN, NAD, Non-toxic appearing Alert & appropriately interactive Not depressed or anxious appearing No increased work of breathing. Pupils are equal. EOM intact without nystagmus No clubbing or cyanosis of the extremities appreciated No significant rashes/lesions/ulcerations overlying the examined area. DP & PT pulses 2+/4.  No significant pretibial edema.  No clubbing or cyanosis Sensation intact to light touch in lower extremities.  Right Knee: Overall joint is well aligned, no significant deformity.   Small effusion, generalized synovitis.   ROM: 0 to 120.   Extensor mechanism intact Generalized medial joint line pain. Stable to varus/valgus strain & anterior/posterior drawer.  Normal Lachman's.   Negative McMurray's and Thessaly.      RADIOLOGY: [place] LIMITED JOINT SPACE STRUCTURES LOW RIGHT(NO LINKED CHARGES) Gerda Diss, DO     10/12/2017 11:48 PM PROCEDURE NOTE -  ULTRASOUND GUIDEDINJECTION: Right Knee Images were obtained and interpreted by myself, Teresa Coombs, DO   Images have been saved and stored to PACS system. Images obtained on: GE S7 Ultrasound machine  ULTRASOUND FINDINGS: Small effusion, generalized synovitis  DESCRIPTION OF PROCEDURE:  The patient's clinical condition is marked by substantial pain  and/or significant functional disability.  Other conservative  therapy has not provided relief, is contraindicated, or not  appropriate. There is a reasonable likelihood that injection will  significantly improve the patient's pain and/or functional  impairment. After discussing the risks, benefits and expected  outcomes of the injection and all questions were reviewed and  answered, the patient wished to undergo the above named  procedure. Verbal consent was obtained. The ultrasound was used  to identify the target structure and adjacent neurovascular  structures. The skin was then prepped in sterile fashion and the  target structure was injected under direct visualization using  sterile technique as below: PREP: Alcohol, Ethel Chloride APPROACH: Direct inplane, superiolateral, single injection, 21g  2" needle INJECTATE: 2cc 0.5% marcaine, 2cc 40mg  DepoMedrol ASPIRATE: N/A DRESSING:  Band-Aid  Post procedural instructions including recommending icing and  warning signs for infection were reviewed. This procedure was  well tolerated and there were no complications.   IMPRESSION: Succesful US Guided Injection    ASSESSMENT & PLAN:     ICD-10-CM   1. Chronic pain of right knee M25.561 DG Knee 1-2 Views Right   G89.29 [place] LIMITED JOINT SPACE STRUCTURES LOW RIGHT(NO LINKED CHARGES)  2. Right knee pain, unspecified chronicity M25.561    ================================================================= Chronic pain of right knee Spontaneous effusion, likely medial meniscal tear.  Injection performed today, VMO strengthening exercises reviewed.  PROCEDURE NOTE -  ULTRASOUND GUIDEDINJECTION: Right Knee Images were obtained and interpreted by myself, Teresa Coombs, DO  Images have been saved and stored to PACS system. Images obtained on: GE S7 Ultrasound machine  ULTRASOUND FINDINGS: Small effusion, generalized synovitis  DESCRIPTION OF PROCEDURE:  The patient's clinical condition is marked by substantial pain and/or  significant functional disability. Other conservative therapy has not provided relief, is contraindicated, or not appropriate. There is a reasonable likelihood that injection will significantly improve the patient's pain and/or functional impairment. After discussing the risks, benefits and expected outcomes of the injection and all questions were reviewed and answered, the patient wished to undergo the above named procedure. Verbal consent was obtained. The ultrasound was used to identify the target structure and adjacent neurovascular structures. The skin was then prepped in sterile fashion and the target structure was injected under direct visualization using sterile technique as below: PREP: Alcohol, Ethel Chloride APPROACH: Direct inplane, superiolateral, single injection, 21g 2" needle INJECTATE: 2cc 0.5% marcaine, 2cc 40mg  DepoMedrol ASPIRATE: N/A DRESSING: Band-Aid  Post procedural instructions including recommending icing and warning signs for infection were reviewed. This procedure was well tolerated and there were no complications.   IMPRESSION: Succesful US Guided Injection    ================================================================= Patient Instructions  You had an injection today.  Things to be aware of after injection are listed below: . You may experience no significant improvement or even a slight worsening in your symptoms during the first 24 to 48 hours.  After that we expect your symptoms to improve gradually over the next 2 weeks for the medicine to have its maximal effect.  You should continue to have improvement out to 6 weeks after your injection. . Dr. Paulla Fore recommends icing the site of the injection for 20 minutes  1-2 times the day of your injection . You may shower but no swimming, tub bath or Jacuzzi for 24 hours. . If your bandage falls off this does not need to be replaced.  It is appropriate to remove the bandage after 4 hours. . You may resume light  activities as tolerated unless otherwise directed per Dr. Paulla Fore during your visit  POSSIBLE STEROID SIDE EFFECTS:  Side effects from injectable steroids tend to be less than when taken orally however you may experience some of the symptoms listed below.  If experienced these should only last for a short period of time. Change in menstrual flow  Edema (swelling)  Increased appetite Skin flushing (redness)  Skin rash/acne  Thrush (oral) Yeast vaginitis    Increased sweating  Depression Increased blood glucose levels Cramping and leg/calf  Euphoria (feeling happy)  POSSIBLE PROCEDURE SIDE EFFECTS: The side effects of the injection are usually fairly minimal however if you may experience some of the following side effects that are usually self-limited and will is off on their own.  If you are concerned please feel free to call  the office with questions:  Increased numbness or tingling  Nausea or vomiting  Swelling or bruising at the injection site   Please call our office if if you experience any of the following symptoms over the next week as these can be signs of infection:   Fever greater than 100.73F  Significant swelling at the injection site  Significant redness or drainage from the injection site  If after 2 weeks you are continuing to have worsening symptoms please call our office to discuss what the next appropriate actions should be including the potential for a return office visit or other diagnostic testing.     ================================================================= Future Appointments Date Time Provider [place]  11/10/2017 10:40 AM Gerda Diss, DO [facility] None  05/11/2018 9:30 AM Marin Olp, MD [facility] None    Follow-up: Return in about 4 weeks (around 11/07/2017).   CMA/ATC served as Education administrator during this visit. History, Physical, and Plan performed by medical provider. Documentation and orders reviewed and attested to.      Teresa Coombs,  [place] [facility] Physician

## 2017-10-12 DIAGNOSIS — M25561 Pain in right knee: Principal | ICD-10-CM

## 2017-10-12 DIAGNOSIS — G8929 Other chronic pain: Secondary | ICD-10-CM | POA: Insufficient documentation

## 2017-10-12 NOTE — Assessment & Plan Note (Signed)
Spontaneous effusion, likely medial meniscal tear.  Injection performed today, VMO strengthening exercises reviewed.

## 2017-10-12 NOTE — Procedures (Signed)
PROCEDURE NOTE -  ULTRASOUND GUIDEDINJECTION: Right Knee Images were obtained and interpreted by myself, Teresa Coombs, DO  Images have been saved and stored to PACS system. Images obtained on: GE S7 Ultrasound machine  ULTRASOUND FINDINGS: Small effusion, generalized synovitis  DESCRIPTION OF PROCEDURE:  The patient's clinical condition is marked by substantial pain and/or significant functional disability. Other conservative therapy has not provided relief, is contraindicated, or not appropriate. There is a reasonable likelihood that injection will significantly improve the patient's pain and/or functional impairment. After discussing the risks, benefits and expected outcomes of the injection and all questions were reviewed and answered, the patient wished to undergo the above named procedure. Verbal consent was obtained. The ultrasound was used to identify the target structure and adjacent neurovascular structures. The skin was then prepped in sterile fashion and the target structure was injected under direct visualization using sterile technique as below: PREP: Alcohol, Ethel Chloride APPROACH: Direct inplane, superiolateral, single injection, 21g 2" needle INJECTATE: 2cc 0.5% marcaine, 2cc 40mg  DepoMedrol ASPIRATE: N/A DRESSING: Band-Aid  Post procedural instructions including recommending icing and warning signs for infection were reviewed. This procedure was well tolerated and there were no complications.   IMPRESSION: Succesful US Guided Injection

## 2017-11-10 ENCOUNTER — Encounter: Payer: Self-pay | Admitting: Sports Medicine

## 2017-11-10 ENCOUNTER — Ambulatory Visit: Payer: Medicare HMO | Admitting: Sports Medicine

## 2017-11-10 VITALS — BP 130/84 | HR 54 | Ht 65.0 in | Wt 144.8 lb

## 2017-11-10 DIAGNOSIS — G8929 Other chronic pain: Secondary | ICD-10-CM | POA: Diagnosis not present

## 2017-11-10 DIAGNOSIS — M25561 Pain in right knee: Secondary | ICD-10-CM

## 2017-11-10 NOTE — Progress Notes (Signed)
OFFICE VISIT NOTE Jennifer Gross. Jennifer Gross, Jennifer Gross at [place] [facility]  Jennifer Gross - 76 y.o. female MRN [id number]  Date of birth: [birth year]  Visit Date: 11/10/2017  PCP: Marin Olp, MD   Referred by: Marin Olp, MD  Josepha Pigg, CMA acting as scribe for Dr. Paulla Fore.  SUBJECTIVE:   Chief Complaint  Patient presents with  . Follow-up    RT knee pain   HPI: As below and per problem based documentation when appropriate.  F/u RT knee pain  Pt has continued pain in the RT knee but is has improved since receiving injection. Pain is still intermittent on the medial aspect of the RT knee. She continues to have swelling around the knee. She has not been very active since the injection because she didn't want to do anything to cause the pain to flare up. She is not using compression around the knee. She is no longer icing the knee. She is not taking any OTC meds to help with knee pain. She has noticed that it is easier going up and down stairs.     Review of Systems  Constitutional: Negative for chills, fever and malaise/fatigue.  Respiratory: Negative for shortness of breath and wheezing.   Cardiovascular: Positive for leg swelling. Negative for chest pain and palpitations.  Musculoskeletal: Positive for joint pain.  Neurological: Negative for dizziness, tingling, weakness and headaches.    Otherwise per HPI.  HISTORY & PERTINENT PRIOR DATA:  No specialty comments available. She reports that  has never smoked. she has never used smokeless tobacco. No results for input(s): HGBA1C, LABURIC, CREATINE in the last 8760 hours.  Invalid input(s): CR Patient Active Problem List   Diagnosis Date Noted  . Chronic pain of right knee 10/12/2017  . History of basal cell carcinoma of skin   . Osteoporosis   . CAD (coronary artery disease) 11/10/2009  . GERD 05/05/2008  . Allergic rhinitis 04/08/2008  . Hyperlipidemia 03/29/2008    . Essential hypertension 03/29/2008   Past Medical History:  Diagnosis Date  . ALLERGIC RHINITIS 04/08/2008  . Allergy   . Basal cell carcinoma    back  . CAD (coronary artery disease) 2008   status post anterior myocardial infarction, treated w/ a dru-eluting stent to the LAD  . CAD, NATIVE VESSEL 11/10/2009  . GERD (gastroesophageal reflux disease)   . Hiatal hernia   . HYPERLIPIDEMIA 03/29/2008  . HYPERTENSION 03/29/2008  . Myocardial infarction ([place])    2008  . Osteopenia    Family History  Problem Relation Age of Onset  . Coronary artery disease Mother   . Coronary artery disease Maternal Uncle   . Lung cancer Father   . Heart disease Brother        stent placed  . Colon cancer Neg Hx   . Esophageal cancer Neg Hx   . Rectal cancer Neg Hx   . Stomach cancer Neg Hx   . CAD Son        died 38 of MI   Past Surgical History:  Procedure Laterality Date  . APPENDECTOMY    . BASAL CELL CARCINOMA EXCISION     back  . Percutaneous transluminal coronary angioplasty and drug-eluting  09/2007   Stent placement in the left anterior descending   Social History   Occupational History  . Occupation: retired  Tobacco Use  . Smoking status: Never Smoker  . Smokeless tobacco: Never Used  Substance  and Sexual Activity  . Alcohol use: No  . Drug use: No  . Sexual activity: Not on file   Allergies  Allergen Reactions  . Penicillins     REACTION: rash   Outpatient Medications Prior to Visit  Medication Sig Dispense Refill  . aspirin 81 MG tablet Take 81 mg by mouth daily.      Marland Kitchen atorvastatin (LIPITOR) 80 MG tablet TAKE ONE TABLET BY MOUTH ONCE DAILY OR AS DIRECTED 90 tablet 2  . carvedilol (COREG) 25 MG tablet TAKE ONE TABLET BY MOUTH TWICE DAILY 180 tablet 2  . diclofenac sodium (VOLTAREN) 1 % GEL Apply 2 g topically 4 (four) times daily. 100 g 1  . hydrochlorothiazide (HYDRODIURIL) 25 MG tablet TAKE ONE TABLET BY MOUTH ONCE DAILY 90 tablet 3  . losartan (COZAAR) 100 MG  tablet TAKE ONE TABLET BY MOUTH ONCE DAILY 90 tablet 3  . nitroGLYCERIN (NITROSTAT) 0.4 MG SL tablet Place 1 tablet (0.4 mg total) under the tongue every 5 (five) minutes as needed for chest pain. 25 tablet 2  . ranitidine (ZANTAC) 150 MG tablet TAKE ONE TABLET BY MOUTH TWICE DAILY 180 tablet 3   No facility-administered medications prior to visit.      OBJECTIVE:  VS:  HT:5\' 5"  (165.1 cm)   WT:144 lb 12.8 oz (65.7 kg)  BMI:24.1    BP:130/84  HR:(!) 54bpm  TEMP: ( )  RESP:96 % EXAM: Findings:  Right knee is overall well aligned.  She has a small amount of residual medial tibial plateau pain/pes bursa pain.  This is worse with resisted adduction.  Knee range of motion is full and unrestricted.  Extensor mechanism intact.  No significant effusion.  Trace bilateral pretibial edema.    RADIOLOGY:  ASSESSMENT & PLAN:     ICD-10-CM   1. Chronic pain of right knee M25.561 Misc procedure   G89.29   2. 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  We will have her continue using the topical diclofenac and icing on a regular basis as well as therapeutic exercises reviewed today.  PROCEDURE NOTE: THERAPEUTIC EXERCISES (97110) 15 minutes spent for Therapeutic exercises as below and as referenced in the AVS. This included exercises focusing on stretching, strengthening, with significant focus on eccentric aspects.  Proper technique shown and discussed handout in great detail with ATC. All questions were discussed and answered.   Long term goals include an improvement in range of motion, strength, endurance as well as avoiding reinjury. Frequency of visits is one time as determined during today's  office visit. Frequency of exercises to be performed is as  per handout.  EXERCISES REVIEWED:  Hip abductor and adductor strengthening, sartorius strengthening, VMO strengthening  ================================================================= Patient Instructions  Please perform the exercise program that we have prepared for you and gone over in detail on a daily basis.  In addition to the handout you were provided you can access your program through: [email or web address]   Your unique program code is: [email or web address]  =================================================================   Follow-up: Return if symptoms worsen or fail to improve.   CMA/ATC served as Education administrator during this visit. History, Physical, and Plan performed by medical provider. Documentation and orders reviewed and attested to.      Teresa Coombs, [facility] Physician

## 2017-11-10 NOTE — Patient Instructions (Signed)
Please perform the exercise program that we have prepared for you and gone over in detail on a daily basis.  In addition to the handout you were provided you can access your program through: [email or web address]   Your unique program code is: [email or web address]

## 2017-11-10 NOTE — Assessment & Plan Note (Signed)
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  We will have her continue using the topical diclofenac and icing on a regular basis as well as therapeutic exercises reviewed today.

## 2017-11-10 NOTE — Procedures (Signed)
PROCEDURE NOTE: THERAPEUTIC EXERCISES (97110) 15 minutes spent for Therapeutic exercises as below and as referenced in the AVS. This included exercises focusing on stretching, strengthening, with significant focus on eccentric aspects.  Proper technique shown and discussed handout in great detail with ATC. All questions were discussed and answered.   Long term goals include an improvement in range of motion, strength, endurance as well as avoiding reinjury. Frequency of visits is one time as determined during today's  office visit. Frequency of exercises to be performed is as per handout.  EXERCISES REVIEWED:  Hip abductor and adductor strengthening, sartorius strengthening, VMO strengthening

## 2017-12-02 ENCOUNTER — Other Ambulatory Visit: Payer: Self-pay | Admitting: Family Medicine

## 2017-12-15 ENCOUNTER — Telehealth: Payer: Self-pay | Admitting: Family Medicine

## 2017-12-15 NOTE — Telephone Encounter (Signed)
MEDICATION:   atorvastatin (LIPITOR) 80 MG tablet     PHARMACY:   [facility], [place] [phone] (Phone) [phone] (Fax)     IS THIS A 90 DAY SUPPLY : yes  IS PATIENT OUT OF MEDICATION:   IF NOT; HOW MUCH IS LEFT:   LAST APPOINTMENT DATE: @11 /19/2018  NEXT APPOINTMENT DATE:@5 /20/2019  OTHER COMMENTS:    **Let patient know to contact pharmacy at the end of the day to make sure medication is ready. **  ** Please notify patient to allow 48-72 hours to process**  **Encourage patient to contact the pharmacy for refills or they can request refills through [facility]**

## 2017-12-17 ENCOUNTER — Other Ambulatory Visit: Payer: Self-pay

## 2017-12-17 MED ORDER — ATORVASTATIN CALCIUM 80 MG PO TABS: ORAL_TABLET | ORAL | 2 refills | Status: DC

## 2017-12-17 NOTE — Telephone Encounter (Signed)
Prescription was sent to pharmacy as requested

## 2018-01-21 ENCOUNTER — Encounter: Payer: Self-pay | Admitting: Cardiovascular Disease

## 2018-01-21 ENCOUNTER — Ambulatory Visit: Payer: Medicare HMO | Admitting: Cardiovascular Disease

## 2018-01-21 DIAGNOSIS — I10 Essential (primary) hypertension: Secondary | ICD-10-CM | POA: Diagnosis not present

## 2018-01-21 DIAGNOSIS — I2510 Atherosclerotic heart disease of native coronary artery without angina pectoris: Secondary | ICD-10-CM | POA: Diagnosis not present

## 2018-01-21 MED ORDER — CARVEDILOL 25 MG PO TABS: 25.0000 mg | ORAL_TABLET | Freq: Two times a day (BID) | ORAL | 3 refills | Status: DC

## 2018-01-21 MED ORDER — HYDROCHLOROTHIAZIDE 25 MG PO TABS: 25.0000 mg | ORAL_TABLET | Freq: Every day | ORAL | 3 refills | Status: DC

## 2018-01-21 NOTE — Progress Notes (Signed)
Cardiology Office Note Date:  01/21/2018   ID:  Jennifer Gross, DOB 10-Apr-1941, MRN [id number]  PCP:  Marin Olp, MD  Cardiologist:  Sherren Mocha, MD    Chief Complaint  Patient presents with  . Follow-up    CAD     History of Present Illness: Jennifer Gross is a 77 y.o. female who presents for follow-up evaluation. The patient is followed for coronary artery disease and initially presented in 2008 with an anterior wall infarction. She was treated with overlapping drug-eluting stents in the LAD and balloon angioplasty of the diagonal branch.   She's here alone today. Doing well. Spending a lot of energy taking care of her husband who is going through some medical issues. She's hasn't had the ability to exercise recently. Today, she denies symptoms of palpitations, chest pain, shortness of breath, orthopnea, PND, lower extremity edema, dizziness, or syncope.  Past Medical History:  Diagnosis Date  . ALLERGIC RHINITIS 04/08/2008  . Allergy   . Basal cell carcinoma    back  . CAD (coronary artery disease) 2008   status post anterior myocardial infarction, treated w/ a dru-eluting stent to the LAD  . CAD, NATIVE VESSEL 11/10/2009  . GERD (gastroesophageal reflux disease)   . Hiatal hernia   . HYPERLIPIDEMIA 03/29/2008  . HYPERTENSION 03/29/2008  . Myocardial infarction ([place])    2008  . Osteopenia     Past Surgical History:  Procedure Laterality Date  . APPENDECTOMY    . BASAL CELL CARCINOMA EXCISION     back  . Percutaneous transluminal coronary angioplasty and drug-eluting  09/2007   Stent placement in the left anterior descending    Current Outpatient Medications  Medication Sig Dispense Refill  . aspirin 81 MG tablet Take 81 mg by mouth daily.      Marland Kitchen atorvastatin (LIPITOR) 80 MG tablet TAKE ONE TABLET BY MOUTH ONCE DAILY OR AS DIRECTED 90 tablet 2  . losartan (COZAAR) 100 MG tablet TAKE ONE TABLET BY MOUTH ONCE DAILY 90 tablet 3  . nitroGLYCERIN (NITROSTAT)  0.4 MG SL tablet Place 1 tablet (0.4 mg total) under the tongue every 5 (five) minutes as needed for chest pain. 25 tablet 2  . ranitidine (ZANTAC) 150 MG tablet TAKE ONE TABLET BY MOUTH TWICE DAILY 180 tablet 3  . VOLTAREN 1 % GEL APPLY 2G TOPICALLY 4 TIMES DAILY 100 g 1  . carvedilol (COREG) 25 MG tablet Take 1 tablet (25 mg total) by mouth 2 (two) times daily. 180 tablet 3  . hydrochlorothiazide (HYDRODIURIL) 25 MG tablet Take 1 tablet (25 mg total) by mouth daily. 90 tablet 3   No current facility-administered medications for this visit.     Allergies:   Penicillins   Social History:  The patient  reports that  has never smoked. she has never used smokeless tobacco. She reports that she does not drink alcohol or use drugs.   Family History:  The patient's  family history includes CAD in her son; Coronary artery disease in her maternal uncle and mother; Heart disease in her brother; Lung cancer in her father.   ROS:  Please see the history of present illness.   All other systems are reviewed and negative.   PHYSICAL EXAM: VS:  BP 134/72   Pulse 70   Ht 5\' 6"  (1.676 m)   Wt 147 lb 12.8 oz (67 kg)   BMI 23.86 kg/m  , BMI Body mass index is 23.86 kg/m. GEN: Well nourished,  well developed, in no acute distress  HEENT: normal  Neck: no JVD, no masses. No carotid bruits Cardiac: RRR without murmur or gallop                Respiratory:  clear to auscultation bilaterally, normal work of breathing GI: soft, nontender, nondistended, + BS MS: no deformity or atrophy  Ext: no pretibial edema, pedal pulses 2+= bilaterally Skin: warm and dry, no rash Neuro:  Strength and sensation are intact Psych: euthymic mood, full affect  EKG:  EKG is ordered today. The ekg ordered today shows NSR with possible age-indeterminate inferoposterior infarct - no change from previous tracing.  Recent Labs: 05/21/2017: ALT 12; BUN 18; Creatinine, Ser 0.85; Hemoglobin 12.5; Platelets 243.0; Potassium 4.0;  Sodium 141   Lipid Panel     Component Value Date/Time   CHOL 137 05/21/2017 1048   TRIG 81.0 05/21/2017 1048   HDL 48.20 05/21/2017 1048   CHOLHDL 3 05/21/2017 1048   VLDL 16.2 05/21/2017 1048   LDLCALC 72 05/21/2017 1048      Wt Readings from Last 3 Encounters:  01/21/18 147 lb 12.8 oz (67 kg)  11/10/17 144 lb 12.8 oz (65.7 kg)  10/10/17 143 lb 6.4 oz (65 kg)    ASSESSMENT AND PLAN: 1.  CAD, native vessel, without angina:  Doing well on ASA alone. See below for secondary risk reduction measures.   2. HTN: BP well-controlled on losartan, HCTZ,  and carvedilol. Continue same Rx.   3. Hyperlipidemia: last lipids reviewed with chol 137, HDL 48, LDL 72. Continue same Rx.   Overall Jennifer Gross has done very well since her MI now over a decade ago. She has no symptoms of recurrent angina or heart failure. I will see her back in one year or sooner if any problems arise.   Current medicines are reviewed with the patient today.  The patient does not have concerns regarding medicines.  Labs/ tests ordered today include:   Orders Placed This Encounter  Procedures  . EKG 12-Lead   Disposition:   FU one year  Signed, Sherren Mocha, MD  01/21/2018 9:42 PM    [facility] [address], [place], [place]  [address] Phone: [phone]; Fax: [phone]

## 2018-01-21 NOTE — Patient Instructions (Signed)

## 2018-03-12 ENCOUNTER — Other Ambulatory Visit: Payer: Self-pay

## 2018-03-12 MED ORDER — DICLOFENAC SODIUM 1 % TD GEL: TRANSDERMAL | 1 refills | Status: DC

## 2018-05-11 ENCOUNTER — Encounter: Payer: Self-pay | Admitting: Family Medicine

## 2018-05-11 ENCOUNTER — Ambulatory Visit (INDEPENDENT_AMBULATORY_CARE_PROVIDER_SITE_OTHER): Payer: Medicare HMO | Admitting: Family Medicine

## 2018-05-11 VITALS — BP 112/78 | HR 62 | Temp 98.2°F | Ht 66.0 in | Wt 146.8 lb

## 2018-05-11 DIAGNOSIS — I10 Essential (primary) hypertension: Secondary | ICD-10-CM | POA: Diagnosis not present

## 2018-05-11 DIAGNOSIS — E785 Hyperlipidemia, unspecified: Secondary | ICD-10-CM

## 2018-05-11 DIAGNOSIS — Z0000 Encounter for general adult medical examination without abnormal findings: Secondary | ICD-10-CM | POA: Diagnosis not present

## 2018-05-11 LAB — COMPREHENSIVE METABOLIC PANEL
ALBUMIN: 4.1 g/dL (ref 3.5–5.2)
ALT: 12 U/L (ref 0–35)
AST: 14 U/L (ref 0–37)
Alkaline Phosphatase: 85 U/L (ref 39–117)
BILIRUBIN TOTAL: 0.5 mg/dL (ref 0.2–1.2)
BUN: 20 mg/dL (ref 6–23)
CALCIUM: 9.6 mg/dL (ref 8.4–10.5)
CO2: 29 mEq/L (ref 19–32)
CREATININE: 0.94 mg/dL (ref 0.40–1.20)
Chloride: 105 mEq/L (ref 96–112)
GFR: 61.26 mL/min (ref >60.00)
Glucose, Bld: 93 mg/dL (ref 70–99)
Potassium: 4.2 mEq/L (ref 3.5–5.1)
SODIUM: 142 meq/L (ref 135–145)
TOTAL PROTEIN: 6.7 g/dL (ref 6.0–8.3)

## 2018-05-11 LAB — CBC
HEMATOCRIT: 39.1 % (ref 36.0–46.0)
HEMOGLOBIN: 12.9 g/dL (ref 12.0–15.0)
MCHC: 33 g/dL (ref 30.0–36.0)
MCV: 92.4 fl (ref 78.0–100.0)
PLATELETS: 249 10*3/uL (ref 150.0–400.0)
RBC: 4.23 Mil/uL (ref 3.87–5.11)
RDW: 14.4 % (ref 11.5–15.5)
WBC: 9.1 10*3/uL (ref 4.0–10.5)

## 2018-05-11 LAB — LIPID PANEL
CHOLESTEROL: 151 mg/dL (ref 0–200)
HDL: 56.8 mg/dL (ref >39.00)
LDL Cholesterol: 82 mg/dL (ref 0–99)
NonHDL: 93.71
TRIGLYCERIDES: 61 mg/dL (ref 0.0–149.0)
Total CHOL/HDL Ratio: 3
VLDL: 12.2 mg/dL (ref 0.0–40.0)

## 2018-05-11 NOTE — Progress Notes (Signed)
Phone: [phone]  Subjective:  Patient presents today for their annual physical. Chief complaint-noted.   See problem oriented charting- ROS- full  review of systems was completed and negative except for: seasonal allergies  The following were reviewed and entered/updated in epic: Past Medical History:  Diagnosis Date  . ALLERGIC RHINITIS 04/08/2008  . Allergy   . Basal cell carcinoma    back  . CAD (coronary artery disease) 2008   status post anterior myocardial infarction, treated w/ a dru-eluting stent to the LAD  . CAD, NATIVE VESSEL 11/10/2009  . GERD (gastroesophageal reflux disease)   . Hiatal hernia   . HYPERLIPIDEMIA 03/29/2008  . HYPERTENSION 03/29/2008  . Myocardial infarction ([place])    2008  . Osteopenia    Patient Active Problem List   Diagnosis Date Noted  . CAD (coronary artery disease) 11/10/2009    Priority: High  . Osteoporosis     Priority: Medium  . Hyperlipidemia 03/29/2008    Priority: Medium  . Essential hypertension 03/29/2008    Priority: Medium  . History of basal cell carcinoma of skin     Priority: Low  . GERD 05/05/2008    Priority: Low  . Allergic rhinitis 04/08/2008    Priority: Low  . Chronic pain of right knee 10/12/2017   Past Surgical History:  Procedure Laterality Date  . APPENDECTOMY    . BASAL CELL CARCINOMA EXCISION     back  . Percutaneous transluminal coronary angioplasty and drug-eluting  09/2007   Stent placement in the left anterior descending    Family History  Problem Relation Age of Onset  . Coronary artery disease Mother   . Coronary artery disease Maternal Uncle   . Lung cancer Father   . Heart disease Brother        stent placed  . Colon cancer Neg Hx   . Esophageal cancer Neg Hx   . Rectal cancer Neg Hx   . Stomach cancer Neg Hx   . CAD Son        died 110 of MI    Medications- reviewed and updated Current Outpatient Medications  Medication Sig Dispense Refill  . aspirin 81 MG tablet Take 81 mg by  mouth daily.      Marland Kitchen atorvastatin (LIPITOR) 80 MG tablet TAKE ONE TABLET BY MOUTH ONCE DAILY OR AS DIRECTED 90 tablet 2  . carvedilol (COREG) 25 MG tablet Take 1 tablet (25 mg total) by mouth 2 (two) times daily. 180 tablet 3  . diclofenac sodium (VOLTAREN) 1 % GEL APPLY 2G TOPICALLY 4 TIMES DAILY 1 Tube 1  . hydrochlorothiazide (HYDRODIURIL) 25 MG tablet Take 1 tablet (25 mg total) by mouth daily. 90 tablet 3  . losartan (COZAAR) 100 MG tablet TAKE ONE TABLET BY MOUTH ONCE DAILY 90 tablet 3  . nitroGLYCERIN (NITROSTAT) 0.4 MG SL tablet Place 1 tablet (0.4 mg total) under the tongue every 5 (five) minutes as needed for chest pain. 25 tablet 2  . ranitidine (ZANTAC) 150 MG tablet TAKE ONE TABLET BY MOUTH TWICE DAILY 180 tablet 3   No current facility-administered medications for this visit.     Allergies-reviewed and updated Allergies  Allergen Reactions  . Penicillins     REACTION: rash    Social History   Social History Narrative   Married 1970. 2 living children. 4 children (lost one at age 54 drug related to heart attack and one at 94 to heart attack not drug related), 6 grandkids. 2 greatgrandkids with  1 on the way March 2016.       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 Oropharynx normal Neck: no thyromegaly CV: RRR no murmurs rubs or gallops Lungs: CTAB no crackles, wheeze, rhonchi Abdomen: soft/nontender/nondistended/normal bowel sounds. No rebound or guarding.  Ext: no edema Skin: warm, dry Neuro: grossly normal, moves all extremities, PERRLA  Assessment/Plan:  77 y.o. female presenting for annual physical.  Health Maintenance  counseling: 1. Anticipatory guidance: Patient counseled regarding regular dental exams -q6 months, eye exams -yearly, wearing seatbelts.  2. Risk factor reduction:  Advised patient of need for regular exercise and diet rich and fruits and vegetables to reduce risk of heart attack and stroke. Exercise- walking about 30 minute 3 days a week. Diet-reasonably healthy diet.  Wt Readings from Last 3 Encounters:  05/11/18 146 lb 12.8 oz (66.6 kg)  01/21/18 147 lb 12.8 oz (67 kg)  11/10/17 144 lb 12.8 oz (65.7 kg)  3. Immunizations/screenings/ancillary studies- discussed shingrix at the pharmacy Immunization History  Administered Date(s) Administered  . Influenza Split 09/27/2011, 10/13/2012  . Influenza Whole 09/29/2008, 09/27/2009, 10/04/2010  . Influenza, High Dose Seasonal PF 09/24/2013, 10/04/2014, 09/28/2015, 09/27/2016, 08/28/2017  . Pneumococcal Conjugate-13 12/02/2014  . Pneumococcal Polysaccharide-23 03/29/2008  . Td 03/29/2008  . Tdap 12/02/2014  . Zoster 02/23/2008  4. Cervical cancer screening- never had abnormal pap smear. Sees Dr. Radene Knee tomorrow 5. Breast cancer screening-  breast exam with GYN Dr. Radene Knee and mammogram - with Dr. Radene Knee 6. Colon cancer screening - never had abnormal colonoscopy. Wants to do one more screening- would like to try cologuard if covered- doesn't appear to be covered so we will hold off 7. Skin cancer screening- yearly in June- Dr. Ledell Peoples office. advised regular sunscreen use. Denies worrisome, changing, or new skin lesions.  8. Birth control/STD check- monogamous and postmenopausal 9. Osteoporosis - follows with Dr. Radene Knee [facility]- decision with him has been to continue vitamin D alone due to stability. Last dexa 2016 and planned for tomorrow  Status of chronic or acute concerns   CAD- patient remains on statin, aspirin, coreg. Not having to use nitroglycerin. Follows with Dr. Burt Knack- just saw her in january  HLD- update lipids on  atorvastatin 80mg . LDL 72 on last check- would love for this to be under 70 but unless over 90 not planning on adding zetia. She follows up yearly  HTN- controlled on coreg 25mg  BID, hctz 12.5mg , losartan 100mg   GERD- controlled on zantac just as needed  Return in about 6 months (around 11/11/2018) for follow up- or sooner if needed.  Lab/Order associations: Preventative health care - Plan: CBC, Comprehensive metabolic panel, Lipid panel  Hyperlipidemia, unspecified hyperlipidemia type - Plan: CBC, Comprehensive metabolic panel, Lipid panel  Essential hypertension - Plan: CBC, Comprehensive metabolic panel, Lipid panel  Return precautions advised.  Garret Reddish, MD

## 2018-05-11 NOTE — Patient Instructions (Addendum)
In addition to seeing me in 6 months...  I would also like for you to sign up for an annual wellness visit with one of our nurses, Cassie or Manuela Schwartz, who both specialize in the annual wellness visit. This is a free benefit under medicare that may help [place] findadditional ways to help you. Some highlights are reviewing medications,lifestyle, and doing a dementia screen.  If you can find Shingrix (new shingles shot), please get both doses in the series and send me a message when you do  Please stop by lab before you go

## 2018-05-11 NOTE — Progress Notes (Signed)
Colo cologuard

## 2018-05-12 DIAGNOSIS — Z6824 Body mass index (BMI) 24.0-24.9, adult: Secondary | ICD-10-CM | POA: Diagnosis not present

## 2018-05-12 DIAGNOSIS — Z1231 Encounter for screening mammogram for malignant neoplasm of breast: Secondary | ICD-10-CM | POA: Diagnosis not present

## 2018-05-12 DIAGNOSIS — N3945 Continuous leakage: Secondary | ICD-10-CM | POA: Diagnosis not present

## 2018-05-12 DIAGNOSIS — N958 Other specified menopausal and perimenopausal disorders: Secondary | ICD-10-CM | POA: Diagnosis not present

## 2018-05-12 DIAGNOSIS — Z124 Encounter for screening for malignant neoplasm of cervix: Secondary | ICD-10-CM | POA: Diagnosis not present

## 2018-05-12 DIAGNOSIS — M816 Localized osteoporosis [Lequesne]: Secondary | ICD-10-CM | POA: Diagnosis not present

## 2018-05-12 DIAGNOSIS — N8189 Other female genital prolapse: Secondary | ICD-10-CM | POA: Diagnosis not present

## 2018-05-20 DIAGNOSIS — N812 Incomplete uterovaginal prolapse: Secondary | ICD-10-CM | POA: Diagnosis not present

## 2018-05-26 ENCOUNTER — Telehealth: Payer: Self-pay | Admitting: Cardiovascular Disease

## 2018-05-26 NOTE — Telephone Encounter (Signed)
New message       [facility] Pre-operative Risk Assessment    Request for surgical clearance:  1. What type of surgery is being performed? Hysterectomy, bilateral salpingo-oophorectomy  2. When is this surgery scheduled? 07/20/2018  3. What type of clearance is required (medical clearance vs. Pharmacy clearance to hold med vs. Both)? Both  4. Are there any medications that need to be held prior to surgery and how long?  5. Practice name and name of physician performing surgery? Dr Arvella Nigh  6. What is your office phone number [phone]   7.   What is your office fax number [phone]  8.   Anesthesia type (None, local, MAC, general) ? general   Laurier Nancy 05/26/2018, 4:03 PM  _________________________________________________________________   (provider comments below)

## 2018-05-26 NOTE — Telephone Encounter (Signed)
   Primary [place], MD  Chart reviewed as part of pre-operative protocol coverage. Last OV 12/2017. It appears this patient already has a pre-op appointment scheduled 06/24/18 with Vin Bhagat. Would keep this appointment.  Pre-op covering staff:  - Please contact requesting surgeon's office via preferred method (i.e, phone, fax) to inform them of need for appointment prior to surgery.  Charlie Pitter, PA-C  05/26/2018, 4:48 PM

## 2018-05-27 NOTE — Telephone Encounter (Signed)
Called Dr. Sherran Needs office and spoke with Nira Conn and advised her of patient's upcoming appointment on 7/3. She thanked me for the call.

## 2018-05-27 NOTE — Telephone Encounter (Signed)
Spoke with patient who states she called this morning to reschedule her appointment for 7/8 due to she will be out of town on 7/3. She is scheduled to see Pecolia Ades, NP on 7/8. She thanked me for the call.

## 2018-06-02 ENCOUNTER — Encounter: Payer: Self-pay | Admitting: Physician Assistant

## 2018-06-02 ENCOUNTER — Ambulatory Visit: Payer: Self-pay

## 2018-06-02 ENCOUNTER — Ambulatory Visit (INDEPENDENT_AMBULATORY_CARE_PROVIDER_SITE_OTHER): Payer: Medicare HMO | Admitting: Physician Assistant

## 2018-06-02 VITALS — BP 130/76 | HR 66 | Temp 98.4°F | Ht 66.0 in | Wt 142.5 lb

## 2018-06-02 DIAGNOSIS — S70362A Insect bite (nonvenomous), left thigh, initial encounter: Secondary | ICD-10-CM | POA: Diagnosis not present

## 2018-06-02 DIAGNOSIS — R21 Rash and other nonspecific skin eruption: Secondary | ICD-10-CM | POA: Diagnosis not present

## 2018-06-02 DIAGNOSIS — J069 Acute upper respiratory infection, unspecified: Secondary | ICD-10-CM | POA: Diagnosis not present

## 2018-06-02 DIAGNOSIS — W57XXXA Bitten or stung by nonvenomous insect and other nonvenomous arthropods, initial encounter: Secondary | ICD-10-CM | POA: Diagnosis not present

## 2018-06-02 MED ORDER — DOXYCYCLINE HYCLATE 100 MG PO TABS: 100.0000 mg | ORAL_TABLET | Freq: Two times a day (BID) | ORAL | 0 refills | Status: DC

## 2018-06-02 NOTE — Patient Instructions (Signed)
Consider over the counter anti-itch cream.  It was great to see you!  Use medication as prescribed: oral doxycycline  Push fluids and get plenty of rest. Please return if you are not improving as expected, or if you have high fevers (>101.5) or difficulty swallowing or worsening productive cough.  Call clinic with questions.  I hope you start feeling better soon!

## 2018-06-02 NOTE — Telephone Encounter (Signed)
Pt.'s daughter reports pt. Started coughing this past Friday.Sounds congested. Taking Delsym. Also reports she had a shingles vaccine last Tuesday at [facility]. Reporting a rash to her chest. Daughter has not seen it, so unsure what it looks like. Denies fever. Appointment made for today. No availability with Dr. Yong Channel.  Reason for Disposition . [1] Continuous (nonstop) coughing interferes with work or school AND [2] no improvement using cough treatment per protocol  Answer Assessment - Initial Assessment Questions 1. ONSET: "When did the cough begin?"      Last Friday 2. SEVERITY: "How bad is the cough today?"      Moderate 3. RESPIRATORY DISTRESS: "Describe your breathing."      No distress 4. FEVER: "Do you have a fever?" If so, ask: "What is your temperature, how was it measured, and when did it start?"     No 5. HEMOPTYSIS: "Are you coughing up any blood?" If so ask: "How much?" (flecks, streaks, tablespoons, etc.)     No 6. TREATMENT: "What have you done so far to treat the cough?" (e.g., meds, fluids, humidifier)     Delsym 7. CARDIAC HISTORY: "Do you have any history of heart disease?" (e.g., heart attack, congestive heart failure)      HTN, STENT placement 11 years ago 8. LUNG HISTORY: "Do you have any history of lung disease?"  (e.g., pulmonary embolus, asthma, emphysema)     No 9. PE RISK FACTORS: "Do you have a history of blood clots?" (or: recent major surgery, recent prolonged travel, bedridden )     No 10. OTHER SYMPTOMS: "Do you have any other symptoms? (e.g., runny nose, wheezing, chest pain)       Sounds congested 11. PREGNANCY: "Is there any chance you are pregnant?" "When was your last menstrual period?"       No 12. TRAVEL: "Have you traveled out of the country in the last month?" (e.g., travel history, exposures)       No  Protocols used: COUGH - ACUTE NON-PRODUCTIVE-A-AH

## 2018-06-02 NOTE — Progress Notes (Signed)
Jennifer Gross is a 77 y.o. female here for a new problem.  I acted as a Education administrator for [employer], PA-C Anselmo Pickler, LPN  History of Present Illness:   Chief Complaint  Patient presents with  . Cough  . Rash    Cough  This is a new problem. Episode onset: Started on Friday, x 4 days. The problem has been gradually improving. The problem occurs every few hours. The cough is non-productive. Associated symptoms include headaches, a rash, a sore throat and wheezing (over the weekend). Pertinent negatives include no chest pain, chills, ear congestion, ear pain, fever, nasal congestion, postnasal drip or shortness of breath. The symptoms are aggravated by lying down. Risk factors: whole family sick. She has tried OTC cough suppressant (Delsym and Tylenol) for the symptoms. The treatment provided moderate relief. Her past medical history is significant for environmental allergies. There is no history of asthma, bronchitis or pneumonia.  Rash  This is a new problem. Episode onset: Started 2 days ago, had Shingles vaccine on June 4th. The problem is unchanged. Location: Right side of abdomen wraps around to back area. The rash is characterized by blistering, redness and itchiness. Associated symptoms include coughing, fatigue and a sore throat. Pertinent negatives include no diarrhea, fever, joint pain, shortness of breath or vomiting. Past treatments include nothing. Her past medical history is significant for allergies. There is no history of asthma, eczema or varicella.   She also endorses a tick bite to her upper inner L thigh. Denies discharge, target-like rash, sensitivity, fever, fatigue, neck pain.   Past Medical History:  Diagnosis Date  . ALLERGIC RHINITIS 04/08/2008  . Allergy   . Basal cell carcinoma    back  . CAD (coronary artery disease) 2008   status post anterior myocardial infarction, treated w/ a dru-eluting stent to the LAD  . CAD, NATIVE VESSEL 11/10/2009  . GERD  (gastroesophageal reflux disease)   . Hiatal hernia   . HYPERLIPIDEMIA 03/29/2008  . HYPERTENSION 03/29/2008  . Myocardial infarction ([place])    2008  . Osteopenia      Social History   Socioeconomic History  . Marital status: Married    Spouse name: Not on file  . Number of children: 4  . Years of education: Not on file  . Highest education level: Not on file  Occupational History  . Occupation: retired  Scientific laboratory technician  . Financial resource strain: Not on file  . Food insecurity:    Worry: Not on file    Inability: Not on file  . Transportation needs:    Medical: Not on file    Non-medical: Not on file  Tobacco Use  . Smoking status: Never Smoker  . Smokeless tobacco: Never Used  Substance and Sexual Activity  . Alcohol use: No  . Drug use: No  . Sexual activity: Not on file  Lifestyle  . Physical activity:    Days per week: Not on file    Minutes per session: Not on file  . Stress: Not on file  Relationships  . Social connections:    Talks on phone: Not on file    Gets together: Not on file    Attends religious service: Not on file    Active member of club or organization: Not on file    Attends meetings of clubs or organizations: Not on file    Relationship status: Not on file  . Intimate partner violence:    Fear of current or ex  partner: Not on file    Emotionally abused: Not on file    Physically abused: Not on file    Forced sexual activity: Not on file  Other Topics Concern  . Not on file  Social History Narrative   Married 1970. 2 living children. 4 children (lost one at age 49 drug related to heart attack and one at 54 to heart attack not drug related), 6 grandkids. 2 greatgrandkids with 1 on the way March 2016.       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 APPENDECTOMY    . BASAL CELL CARCINOMA EXCISION     back  . Percutaneous transluminal coronary  angioplasty and drug-eluting  09/2007   Stent placement in the left anterior descending    Family History  Problem Relation Age of Onset  . Coronary artery disease Mother   . Coronary artery disease Maternal Uncle   . Lung cancer Father   . Heart disease Brother        stent placed  . Colon cancer Neg Hx   . Esophageal cancer Neg Hx   . Rectal cancer Neg Hx   . Stomach cancer Neg Hx   . CAD Son        died 68 of MI    Allergies  Allergen Reactions  . Penicillins     REACTION: rash    Current Medications:   Current Outpatient Medications:  .  aspirin 81 MG tablet, Take 81 mg by mouth daily.  , Disp: , Rfl:  .  atorvastatin (LIPITOR) 80 MG tablet, TAKE ONE TABLET BY MOUTH ONCE DAILY OR AS DIRECTED, Disp: 90 tablet, Rfl: 2 .  carvedilol (COREG) 25 MG tablet, Take 1 tablet (25 mg total) by mouth 2 (two) times daily., Disp: 180 tablet, Rfl: 3 .  diclofenac sodium (VOLTAREN) 1 % GEL, APPLY 2G TOPICALLY 4 TIMES DAILY, Disp: 1 Tube, Rfl: 1 .  hydrochlorothiazide (HYDRODIURIL) 25 MG tablet, Take 1 tablet (25 mg total) by mouth daily., Disp: 90 tablet, Rfl: 3 .  losartan (COZAAR) 100 MG tablet, TAKE ONE TABLET BY MOUTH ONCE DAILY, Disp: 90 tablet, Rfl: 3 .  nitroGLYCERIN (NITROSTAT) 0.4 MG SL tablet, Place 1 tablet (0.4 mg total) under the tongue every 5 (five) minutes as needed for chest pain., Disp: 25 tablet, Rfl: 2 .  ranitidine (ZANTAC) 150 MG tablet, TAKE ONE TABLET BY MOUTH TWICE DAILY, Disp: 180 tablet, Rfl: 3 .  doxycycline (VIBRA-TABS) 100 MG tablet, Take 1 tablet (100 mg total) by mouth 2 (two) times daily., Disp: 20 tablet, Rfl: 0   Review of Systems:   Review of Systems  Constitutional: Positive for fatigue. Negative for chills and fever.  HENT: Positive for sore throat. Negative for ear pain and postnasal drip.   Respiratory: Positive for cough and wheezing (over the weekend). Negative for shortness of breath.   Cardiovascular: Negative for chest pain.   Gastrointestinal: Negative for diarrhea and vomiting.  Musculoskeletal: Negative for joint pain.  Skin: Positive for rash.  Neurological: Positive for headaches.  Endo/Heme/Allergies: Positive for environmental allergies.    Vitals:   Vitals:   06/02/18 1416  BP: 130/76  Pulse: 66  Temp: 98.4 F (36.9 C)  TempSrc: Oral  SpO2: 96%  Weight: 142 lb 8 oz (64.6 kg)  Height: 5\' 6"  (1.676 m)     Body mass index  is 23 kg/m.  Physical Exam:   Physical Exam  Constitutional: She appears well-developed. She is cooperative.  Non-toxic appearance. She does not have a sickly appearance. She does not appear ill. No distress.  HENT:  Head: Normocephalic and atraumatic.  Right Ear: Tympanic membrane, external ear and ear canal normal. Tympanic membrane is not erythematous, not retracted and not bulging.  Left Ear: Tympanic membrane, external ear and ear canal normal. Tympanic membrane is not erythematous, not retracted and not bulging.  Nose: Mucosal edema present. Right sinus exhibits no maxillary sinus tenderness and no frontal sinus tenderness. Left sinus exhibits no maxillary sinus tenderness and no frontal sinus tenderness.  Mouth/Throat: Uvula is midline and mucous membranes are normal. Posterior oropharyngeal erythema present. No posterior oropharyngeal edema. Tonsils are 1+ on the right. Tonsils are 1+ on the left.  Eyes: Conjunctivae and lids are normal.  Neck: Trachea normal.  Cardiovascular: Normal rate, regular rhythm, S1 normal, S2 normal, normal heart sounds and normal pulses.  No LE edema  Pulmonary/Chest: Effort normal and breath sounds normal. She has no decreased breath sounds. She has no wheezes. She has no rhonchi. She has no rales.  Lymphadenopathy:    She has no cervical adenopathy.  Neurological: She is alert. GCS eye subscore is 4. GCS verbal subscore is 5. GCS motor subscore is 6.  Skin: Skin is warm, dry and intact.  Dime-sized area of erythema with central "pore  like" area without tenderness or discharge to L inner thigh  Numerous scattered erythematous patches to left upper abdomen -- no area of scabbing or discharge; most lesions a few mm in size    Psychiatric: She has a normal mood and affect. Her speech is normal and behavior is normal.  Nursing note and vitals reviewed.   Assessment and Plan:    Skylen was seen today for cough and rash.  Diagnoses and all orders for this visit:  Upper respiratory tract infection, unspecified type Husband in office last week and diagnosed with URI -- treated with antibiotic. Will treat her with doxycycline per orders. No red flags on exam. Push fluids and rest. Follow-up if symptoms persist despite treatment.  Rash and nonspecific skin eruption Dr. Dimas Chyle in to see patient. Do not suspect shingles. Question if hypersensitivity reaction or possible dermatitis. Recommend OTC anti-itch cream. I recommend that she proceed with 2nd shingrix vaxx once she is due for it, if she is comfortable and does not develop further symptoms. Consider meeting with PCP to discuss further for second opinion if needed.  Insect bite of left thigh, initial encounter Does not appear infectious at this time, however will use doxycycline for URI to provide additional coverage for this.  Other orders -     doxycycline (VIBRA-TABS) 100 MG tablet; Take 1 tablet (100 mg total) by mouth 2 (two) times daily.    . Reviewed expectations re: course of current medical issues. . Discussed self-management of symptoms. . Outlined signs and symptoms indicating need for more acute intervention. . Patient verbalized understanding and all questions were answered. . See orders for this visit as documented in the electronic medical record. . Patient received an After-Visit Summary.  CMA or LPN served as scribe during this visit. History, Physical, and Plan performed by medical provider. Documentation and orders reviewed and attested  to.   Inda Coke, PA-C

## 2018-06-15 DIAGNOSIS — L814 Other melanin hyperpigmentation: Secondary | ICD-10-CM | POA: Diagnosis not present

## 2018-06-15 DIAGNOSIS — L819 Disorder of pigmentation, unspecified: Secondary | ICD-10-CM | POA: Diagnosis not present

## 2018-06-15 DIAGNOSIS — Z85828 Personal history of other malignant neoplasm of skin: Secondary | ICD-10-CM | POA: Diagnosis not present

## 2018-06-15 DIAGNOSIS — L821 Other seborrheic keratosis: Secondary | ICD-10-CM | POA: Diagnosis not present

## 2018-06-15 DIAGNOSIS — D229 Melanocytic nevi, unspecified: Secondary | ICD-10-CM | POA: Diagnosis not present

## 2018-06-15 DIAGNOSIS — D1801 Hemangioma of skin and subcutaneous tissue: Secondary | ICD-10-CM | POA: Diagnosis not present

## 2018-06-15 DIAGNOSIS — L570 Actinic keratosis: Secondary | ICD-10-CM | POA: Diagnosis not present

## 2018-06-19 ENCOUNTER — Encounter: Payer: Self-pay | Admitting: Cardiology

## 2018-06-24 ENCOUNTER — Ambulatory Visit: Payer: Medicare HMO | Admitting: Physician Assistant

## 2018-06-29 ENCOUNTER — Encounter: Payer: Self-pay | Admitting: Cardiology

## 2018-06-29 ENCOUNTER — Ambulatory Visit: Payer: Medicare HMO | Admitting: Cardiology

## 2018-06-29 VITALS — BP 132/72 | HR 59 | Ht 65.0 in | Wt 143.4 lb

## 2018-06-29 DIAGNOSIS — I10 Essential (primary) hypertension: Secondary | ICD-10-CM

## 2018-06-29 DIAGNOSIS — Z01810 Encounter for preprocedural cardiovascular examination: Secondary | ICD-10-CM | POA: Diagnosis not present

## 2018-06-29 DIAGNOSIS — E785 Hyperlipidemia, unspecified: Secondary | ICD-10-CM

## 2018-06-29 DIAGNOSIS — I2510 Atherosclerotic heart disease of native coronary artery without angina pectoris: Secondary | ICD-10-CM

## 2018-06-29 MED ORDER — EZETIMIBE 10 MG PO TABS: 10.0000 mg | ORAL_TABLET | Freq: Every day | ORAL | 3 refills | Status: DC

## 2018-06-29 NOTE — Patient Instructions (Signed)
Medication Instructions: Your physician has recommended you make the following change in your medication:  START: Zetia 10 mg daily   Labwork: None Ordered  Procedures/Testing: None Ordered  Follow-Up: Your physician recommends that you schedule a follow-up appointment in: 6 months with Dr.Cooper    Any Additional Special Instructions Will Be Listed Below (If Applicable).     If you need a refill on your cardiac medications before your next appointment, please call your pharmacy.

## 2018-06-29 NOTE — Progress Notes (Signed)
Cardiology Office Note:    Date:  06/29/2018   ID:  Jennifer Gross, DOB Mar 19, 1941, MRN [id number]  PCP:  Marin Olp, MD  Cardiologist:  Sherren Mocha, MD  Referring MD: Marin Olp, MD   Chief Complaint  Patient presents with  . Follow-up    Surgical clearance    History of Present Illness:    Jennifer Gross is a 77 y.o. female with a past medical history significant for CAD status post anterior wall MI in 2008 with DES to LAD and angioplasty of diagonal.  She also has a history of hyperlipidemia, hypertension, hiatal hernia and osteopenia.  She is treated for CAD with aspirin 81 mg, atorvastatin 80 mg, carvedilol 25 mg twice daily, ARB.  We have been requested for cardiac clearance prior to laparoscopic vaginal hysterectomy with oophorectomy scheduled for 07/20/2018. No cardiac issues since 2008. She does housework, vacuums, mows with riding mower and weed eats. She walks ~2 times per week for about 30 minutes and keeps up with her daughter. She feels that she is pretty active with no chest pain/pressure/tightness or shortness of breath. No orthopnea, PND or edema. No palpitations, lightheadedness, dizziness or syncope. She was taken off Plavix in January and continues on aspirin.   Home BPs 115-120, occ as low As 90 but asymptomatic.    Past Medical History:  Diagnosis Date  . ALLERGIC RHINITIS 04/08/2008  . Allergy   . Basal cell carcinoma    back  . CAD (coronary artery disease) 2008   status post anterior myocardial infarction, treated w/ a dru-eluting stent to the LAD  . CAD, NATIVE VESSEL 11/10/2009  . GERD (gastroesophageal reflux disease)   . Hiatal hernia   . HYPERLIPIDEMIA 03/29/2008  . HYPERTENSION 03/29/2008  . Myocardial infarction ([place])    2008  . Osteopenia     Past Surgical History:  Procedure Laterality Date  . APPENDECTOMY    . BASAL CELL CARCINOMA EXCISION     back  . Percutaneous transluminal coronary angioplasty and drug-eluting  09/2007     Stent placement in the left anterior descending    Current Medications: Current Meds  Medication Sig  . aspirin 81 MG tablet Take 81 mg by mouth daily.    Marland Kitchen atorvastatin (LIPITOR) 80 MG tablet TAKE ONE TABLET BY MOUTH ONCE DAILY OR AS DIRECTED  . carvedilol (COREG) 25 MG tablet Take 1 tablet (25 mg total) by mouth 2 (two) times daily.  . diclofenac sodium (VOLTAREN) 1 % GEL APPLY 2G TOPICALLY 4 TIMES DAILY  . hydrochlorothiazide (HYDRODIURIL) 25 MG tablet Take 1 tablet (25 mg total) by mouth daily.  Marland Kitchen losartan (COZAAR) 100 MG tablet TAKE ONE TABLET BY MOUTH ONCE DAILY  . nitroGLYCERIN (NITROSTAT) 0.4 MG SL tablet Place 1 tablet (0.4 mg total) under the tongue every 5 (five) minutes as needed for chest pain.  . ranitidine (ZANTAC) 150 MG tablet TAKE ONE TABLET BY MOUTH TWICE DAILY     Allergies:   Penicillins   Social History   Socioeconomic History  . Marital status: Married    Spouse name: Not on file  . Number of children: 4  . Years of education: Not on file  . Highest education level: Not on file  Occupational History  . Occupation: retired  Scientific laboratory technician  . Financial resource strain: Not on file  . Food insecurity:    Worry: Not on file    Inability: Not on file  . Transportation needs:  Medical: Not on file    Non-medical: Not on file  Tobacco Use  . Smoking status: Never Smoker  . Smokeless tobacco: Never Used  Substance and Sexual Activity  . Alcohol use: No  . Drug use: No  . Sexual activity: Not on file  Lifestyle  . Physical activity:    Days per week: Not on file    Minutes per session: Not on file  . Stress: Not on file  Relationships  . Social connections:    Talks on phone: Not on file    Gets together: Not on file    Attends religious service: Not on file    Active member of club or organization: Not on file    Attends meetings of clubs or organizations: Not on file    Relationship status: Not on file  Other Topics Concern  . Not on file   Social History Narrative   Married 1970. 2 living children. 4 children (lost one at age 34 drug related to heart attack and one at 80 to heart attack not drug related), 6 grandkids. 2 greatgrandkids with 1 on the way March 2016.       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 There is no history of Colon cancer, Esophageal cancer, Rectal cancer, or Stomach cancer. ROS:   Please see the history of present illness.     All other systems reviewed and are negative.  EKGs/Labs/Other Studies Reviewed:     EKG:  EKG is ordered today.  The ekg ordered today demonstrates once bradycardia, 55 bpm, QTC 417, no changes from prior  Recent Labs: 05/11/2018: ALT 12; BUN 20; Creatinine, Ser 0.94; Hemoglobin 12.9; Platelets 249.0; Potassium 4.2; Sodium 142   Recent Lipid Panel    Component Value Date/Time   CHOL 151 05/11/2018 1004   TRIG 61.0 05/11/2018 1004   HDL 56.80 05/11/2018 1004   CHOLHDL 3 05/11/2018 1004   VLDL 12.2 05/11/2018 1004   LDLCALC 82 05/11/2018 1004    Physical Exam:    VS:  BP 132/72   Pulse (!) 59   Ht 5\' 5"  (1.651 m)   Wt 143 lb 6.4 oz (65 kg)   SpO2 98%   BMI 23.86 kg/m     Wt Readings from Last 3 Encounters:  06/29/18 143 lb 6.4 oz (65 kg)  06/02/18 142 lb 8 oz (64.6 kg)  05/11/18 146 lb 12.8 oz (66.6 kg)     Physical Exam  Constitutional: She is oriented to person, place, and time. She appears well-developed and well-nourished. No distress.  HENT:  Head: Normocephalic and atraumatic.  Neck: Normal range of motion. Neck supple. No JVD present.  Cardiovascular: Normal rate, regular rhythm, normal heart sounds and intact distal pulses.  Pulmonary/Chest: Effort normal and breath sounds normal. No respiratory distress. She has no  wheezes. She has no rales.  Abdominal: Soft. Bowel sounds are normal.  Musculoskeletal: Normal range of motion. She exhibits no edema or deformity.  Neurological: She is alert and oriented to person, place, and time.  Skin: Skin is warm and dry.  Psychiatric: She has a normal mood and affect. Her behavior is normal. Judgment and thought content normal.  Vitals reviewed.   ASSESSMENT:    1.  Coronary artery disease involving native coronary artery of native heart without angina pectoris   2. Essential hypertension   3. Hyperlipidemia, unspecified hyperlipidemia type   4. Preop cardiovascular exam    PLAN:    In order of problems listed above:  CAD: Status post MI with DES to LAD and angioplasty of the diagonal in 2008.  No further cardiac issues since 2008.  Treated with aspirin, statin, carvedilol, ARB.  Tolerating her medications well.  She is fairly active with no anginal symptoms.  Continue current therapy.  Hypertension: Carvedilol 25 mg twice daily, hydrochlorothiazide 25 mg daily, losartan 100 mg daily.  Blood pressures currently well controlled with no lightheadedness or syncope.  Continue current therapy.  Hyperlipidemia: Atorvastatin 80 mg daily.  With her known CAD, LDL goal is <70.  Recent lipid panel 05/11/2018 showed LDL of 82.  The patient is in agreement to start Zetia 10 mg daily to try to get LDL to goal.  She will have her cholesterol checked with her PCP in a couple of months.  I discussed PCSK9 inhibitors and she prefers to try Zetia prior to going that route.  Preoperative clearance: The patient has a history of CAD, hypertension and hyperlipidemia.  Renal function is normal by labs.  She has no current anginal symptoms and is active, able to complete greater than 4 METS of activity fairly easily.  Her revised cardiac risk index is 1 indicating a 0.9% perioperative risk of major cardiac event which is low risk. Based on ACC/AHA guidelines, JOELLE ROSWELL would be at  acceptable risk for the planned procedure without further cardiovascular testing.  Her aspirin can be held if needed for the procedure and resumed after.  I will E fax this note for cardiac clearance to the requesting provider.      Dr Arvella Nigh      What is your office phone number [phone]      What is your office fax number [phone]   Medication Adjustments/Labs and Tests Ordered: Current medicines are reviewed at length with the patient today.  Concerns regarding medicines are outlined above. Labs and tests ordered and medication changes are outlined in the patient instructions below:  Patient Instructions  Medication Instructions: Your physician has recommended you make the following change in your medication:  START: Zetia 10 mg daily   Labwork: None Ordered  Procedures/Testing: None Ordered  Follow-Up: Your physician recommends that you schedule a follow-up appointment in: 6 months with Dr.Cooper    Any Additional Special Instructions Will Be Listed Below (If Applicable).     If you need a refill on your cardiac medications before your next appointment, please call your pharmacy.      Signed, Daune Perch, NP  06/29/2018 1:55 PM    [facility]

## 2018-06-30 DIAGNOSIS — N814 Uterovaginal prolapse, unspecified: Secondary | ICD-10-CM | POA: Diagnosis not present

## 2018-06-30 DIAGNOSIS — N8182 Incompetence or weakening of pubocervical tissue: Secondary | ICD-10-CM | POA: Diagnosis not present

## 2018-06-30 DIAGNOSIS — N393 Stress incontinence (female) (male): Secondary | ICD-10-CM | POA: Diagnosis not present

## 2018-06-30 DIAGNOSIS — N816 Rectocele: Secondary | ICD-10-CM | POA: Diagnosis not present

## 2018-06-30 DIAGNOSIS — N8110 Cystocele, unspecified: Secondary | ICD-10-CM | POA: Diagnosis not present

## 2018-07-01 NOTE — Patient Instructions (Addendum)
Your procedure is scheduled on: Monday July 20, 2018 at 7:30 am  Enter through the Main Entrance of [facility] at: 6:00 am  Pick up the phone at the desk and dial [phone].  Call this number if you have problems the morning of surgery: [phone].  Remember: Do NOT eat food or drink any liquids: after Midnight on Sunday July 28  Take these medicines the morning of surgery with a SIP OF WATER: Carvedilol, Zetia, Zantac  Stop aspirin 1 week prior to surgery  STOP ALL VITAMINS, SUPPLEMENTS, HERBAL MEDICATIONS NOW  DO NOT SMOKE DAY OF SURGERY  BRUSH YOUR TEETH DAY OF SURGERY  Do NOT wear jewelry (body piercing), metal hair clips/bobby pins, make-up, or nail polish. Do NOT wear lotions, powders, or perfumes.  You may wear deoderant. Do NOT shave for 48 hours prior to surgery. Do NOT bring valuables to the hospital. Contacts, dentures, or bridgework may not be worn into surgery. Leave suitcase in car.  After surgery it may be brought to your room.    For patients admitted to the hospital, checkout time is 11:00 AM the day of discharge.

## 2018-07-02 NOTE — H&P (Signed)
Patient name  Jennifer Gross, Jennifer Gross DICTATION#[id number] CSN# [id number]  Darlyn Chamber, MD 07/02/2018 7:02 AM

## 2018-07-02 NOTE — H&P (Signed)
NAME: Jennifer Gross, Jennifer Gross MEDICAL RECORD [id number] ACCOUNT [id number] DATE OF BIRTH:11-30-1941 FACILITY: [facility] LOCATION:  PHYSICIAN:Armilda Vanderlinden Sherran Needs, MD  HISTORY AND PHYSICAL  DATE OF ADMISSION:  07/20/2018  Date of surgery is 07/20/2018 [facility] here in [place].  HISTORY OF PRESENT ILLNESS:  The patient is a 77 year old gravida 4, para 4, postmenopausal patient, presents for laparoscopic assisted vaginal hysterectomy with anterior and posterior repair, sacrospinous ligament suspension and midurethral sling.  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  The patient has been having trouble with worsening pressure and discomfort due to pelvic relaxation.  This has been associated with stress incontinence.  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  She now presents for the above noted surgery.  ALLERGIES:  She is allergic to PENICILLINS.  MEDICATIONS:  She is on aspirin each day.  She takes Lipitor 80 mg daily.  Coreg 25 mg by mouth 2 times daily.  Voltaren 2 grams topically 4 times a day.  Hydrochlorothiazide 25 mg 1 time daily.  Losartan 100 mg 1 tablet by mouth daily.  She uses  Nitrostat as needed for chest pain and Zantac 150 mg.  PAST MEDICAL HISTORY:  She has got a history of coronary artery disease with placement of a stent.  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  She has gastroesophageal reflux disorder.  Hiatal hernia.   Hyperlipidemia.  Hypertension.  Osteopenia.  She has had a basal cell carcinoma excised. She has had an appendectomy.  She has had 4 vaginal deliveries.  SOCIAL HISTORY:  Reveals no tobacco or alcohol use.  FAMILY HISTORY:  Noncontributory.  REVIEW OF SYSTEMS:  Noncontributory.  PHYSICAL EXAMINATION: VITAL SIGNS:  Afebrile, stable vital signs. HEENT:  The patient is normocephalic.   Pupils are round, reactive to light and accommodation.  Extraocular movements are intact.  Sclerae and conjunctivae were clear.  Oropharynx clear. NECK:  Without thyromegaly. BREASTS:  No discrete masses. LUNGS:  Clear. HEART:  Regular rhythm and rate without murmurs or gallops.  No carotid or abdominal  bruits. ABDOMEN:  Her abdominal exam is benign.  No masses, organomegaly or tenderness. PELVIC:  Normal external genitalia.  Vaginal mucosa is clear.  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  Uterus normal size and shape.  Adnexa unremarkable. EXTREMITIES:  Trace edema. NEUROLOGIC:  Grossly normal limits.  ASSESSMENT: 1. 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 2. History of coronary artery disease with placement of stent. 3. Hypertension. 4. Hyperlipidemia.  PLAN:  The patient will undergo above-noted surgery.  The risk have been discussed including the risk of infection.  The risk of hemorrhage that could require transfusion with the risk of AIDS or hepatitis.  Risk of injury to adjacent organs such as  bladder, bowel, ureters or could require further exploratory surgery.  Risk of deep venous thrombosis and pulmonary embolus.  Potential risk of recurrent and pelvic relaxation has been discussed with a midurethral sling.  There is a risk of mesh  erosions.  The possibility of inability to void, requiring loosening of the sling.  Possibility of mesh rejection requiring removing the mesh and return of incontinence.  Possibility of developing of overactive bladder.  Rare risk of injury to the  obturator nerve with resultant leg weakness.  The patient expressed understanding of the indication, risks and alternatives.  She did see the cardiologist and did get  approval for the surgery.     AN/NUANCE  D:07/02/2018 T:07/02/2018 JOB:[id number]

## 2018-07-08 ENCOUNTER — Encounter (HOSPITAL_COMMUNITY): Payer: Self-pay

## 2018-07-08 ENCOUNTER — Other Ambulatory Visit: Payer: Self-pay

## 2018-07-08 ENCOUNTER — Encounter (HOSPITAL_COMMUNITY)
Admission: RE | Admit: 2018-07-08 | Discharge: 2018-07-08 | Disposition: A | Discharge status: Home / Self Care | Payer: Medicare HMO | Source: Ambulatory Visit | Attending: Obstetrics and Gynecology | Admitting: Obstetrics and Gynecology

## 2018-07-08 DIAGNOSIS — Z01812 Encounter for preprocedural laboratory examination: Secondary | ICD-10-CM | POA: Diagnosis not present

## 2018-07-08 LAB — CBC
HEMATOCRIT: 37.4 % (ref 36.0–46.0)
HEMOGLOBIN: 12.5 g/dL (ref 12.0–15.0)
MCH: 30.7 pg (ref 26.0–34.0)
MCHC: 33.4 g/dL (ref 30.0–36.0)
MCV: 91.9 fL (ref 78.0–100.0)
Platelets: 247 10*3/uL (ref 150–400)
RBC: 4.07 MIL/uL (ref 3.87–5.11)
RDW: 14.9 % (ref 11.5–15.5)
WBC: 10.1 10*3/uL (ref 4.0–10.5)

## 2018-07-08 LAB — ABO/RH: ABO/RH(D): B NEG

## 2018-07-08 LAB — COMPREHENSIVE METABOLIC PANEL
ALBUMIN: 4 g/dL (ref 3.5–5.0)
ALK PHOS: 70 U/L (ref 38–126)
ALT: 23 U/L (ref 0–44)
ANION GAP: 8 (ref 5–15)
AST: 26 U/L (ref 15–41)
BILIRUBIN TOTAL: 0.7 mg/dL (ref 0.3–1.2)
BUN: 18 mg/dL (ref 8–23)
CALCIUM: 9 mg/dL (ref 8.9–10.3)
CO2: 25 mmol/L (ref 22–32)
Chloride: 104 mmol/L (ref 98–111)
Creatinine, Ser: 0.81 mg/dL (ref 0.44–1.00)
GFR calc Af Amer: >60 mL/min (ref >60)
GLUCOSE: 94 mg/dL (ref 70–99)
Potassium: 4 mmol/L (ref 3.5–5.1)
Sodium: 137 mmol/L (ref 135–145)
TOTAL PROTEIN: 7.1 g/dL (ref 6.5–8.1)

## 2018-07-08 LAB — TYPE AND SCREEN
ABO/RH(D): B NEG
Antibody Screen: NEGATIVE

## 2018-07-08 NOTE — Pre-Procedure Instructions (Signed)
Dr. Royce Macadamia viewed and okay 'd EKG. AWARE OF HISTORY OF HYPERTENSON AND CAD

## 2018-07-14 DIAGNOSIS — N8182 Incompetence or weakening of pubocervical tissue: Secondary | ICD-10-CM | POA: Diagnosis not present

## 2018-07-19 NOTE — Anesthesia Preprocedure Evaluation (Addendum)
Anesthesia Evaluation  Patient identified by MRN, date of birth, ID band Patient awake    Reviewed: Allergy & Precautions, NPO status , Patient's Chart, lab work & pertinent test results, reviewed documented beta blocker date and time   Airway Mallampati: III  TM Distance: >3 FB Neck ROM: Full    Dental no notable dental hx.    Pulmonary neg pulmonary ROS,    Pulmonary exam normal breath sounds clear to auscultation       Cardiovascular hypertension, Pt. on home beta blockers and Pt. on medications + CAD, + Past MI and + Cardiac Stents (x 2 in 2008)  Normal cardiovascular exam Rhythm:Regular Rate:Normal  ECG: SB, rate 55  Pre- op eval per cardiology   Neuro/Psych negative neurological ROS  negative psych ROS   GI/Hepatic Neg liver ROS, hiatal hernia, GERD  Medicated and Controlled,  Endo/Other  negative endocrine ROS  Renal/GU negative Renal ROS     Musculoskeletal negative musculoskeletal ROS (+)   Abdominal   Peds  Hematology HLD   Anesthesia Other Findings PELVIC RELAXATION SUI  Reproductive/Obstetrics                            Anesthesia Physical Anesthesia Plan  ASA: III  Anesthesia Plan: General   Post-op Pain Management:    Induction: Intravenous  PONV Risk Score and Plan: 3 and Ondansetron, Dexamethasone and Treatment may vary due to age or medical condition  Airway Management Planned: Oral ETT  Additional Equipment:   Intra-op Plan:   Post-operative Plan: Extubation in OR  Informed Consent: I have reviewed the patients History and Physical, chart, labs and discussed the procedure including the risks, benefits and alternatives for the proposed anesthesia with the patient or authorized representative who has indicated his/her understanding and acceptance.   Dental advisory given  Plan Discussed with: CRNA  Anesthesia Plan Comments:        Anesthesia Quick  Evaluation

## 2018-07-20 ENCOUNTER — Encounter (HOSPITAL_COMMUNITY): Payer: Self-pay

## 2018-07-20 ENCOUNTER — Encounter (HOSPITAL_COMMUNITY)
Admission: AD | Disposition: A | Discharge status: Home / Self Care | Payer: Self-pay | Source: Ambulatory Visit | Attending: Obstetrics and Gynecology

## 2018-07-20 ENCOUNTER — Ambulatory Visit (HOSPITAL_COMMUNITY): Payer: Medicare HMO | Admitting: Anesthesiology

## 2018-07-20 ENCOUNTER — Ambulatory Visit (HOSPITAL_COMMUNITY)
Admission: AD | Admit: 2018-07-20 | Discharge: 2018-07-21 | Disposition: A | Discharge status: Home / Self Care | Payer: Medicare HMO | Source: Ambulatory Visit | Attending: Obstetrics and Gynecology | Admitting: Obstetrics and Gynecology

## 2018-07-20 ENCOUNTER — Other Ambulatory Visit: Payer: Self-pay

## 2018-07-20 DIAGNOSIS — D271 Benign neoplasm of left ovary: Secondary | ICD-10-CM | POA: Diagnosis not present

## 2018-07-20 DIAGNOSIS — Z85828 Personal history of other malignant neoplasm of skin: Secondary | ICD-10-CM | POA: Insufficient documentation

## 2018-07-20 DIAGNOSIS — E785 Hyperlipidemia, unspecified: Secondary | ICD-10-CM | POA: Insufficient documentation

## 2018-07-20 DIAGNOSIS — Z955 Presence of coronary angioplasty implant and graft: Secondary | ICD-10-CM | POA: Insufficient documentation

## 2018-07-20 DIAGNOSIS — M8580 Other specified disorders of bone density and structure, unspecified site: Secondary | ICD-10-CM | POA: Diagnosis not present

## 2018-07-20 DIAGNOSIS — N8189 Other female genital prolapse: Secondary | ICD-10-CM | POA: Diagnosis not present

## 2018-07-20 DIAGNOSIS — K219 Gastro-esophageal reflux disease without esophagitis: Secondary | ICD-10-CM | POA: Diagnosis not present

## 2018-07-20 DIAGNOSIS — Z90710 Acquired absence of both cervix and uterus: Secondary | ICD-10-CM | POA: Diagnosis present

## 2018-07-20 DIAGNOSIS — I10 Essential (primary) hypertension: Secondary | ICD-10-CM | POA: Insufficient documentation

## 2018-07-20 DIAGNOSIS — I2510 Atherosclerotic heart disease of native coronary artery without angina pectoris: Secondary | ICD-10-CM | POA: Diagnosis not present

## 2018-07-20 DIAGNOSIS — N880 Leukoplakia of cervix uteri: Secondary | ICD-10-CM | POA: Diagnosis not present

## 2018-07-20 DIAGNOSIS — N393 Stress incontinence (female) (male): Secondary | ICD-10-CM | POA: Insufficient documentation

## 2018-07-20 DIAGNOSIS — Z880 Allergy status to penicillin: Secondary | ICD-10-CM | POA: Insufficient documentation

## 2018-07-20 DIAGNOSIS — N3641 Hypermobility of urethra: Secondary | ICD-10-CM | POA: Insufficient documentation

## 2018-07-20 DIAGNOSIS — I252 Old myocardial infarction: Secondary | ICD-10-CM | POA: Diagnosis not present

## 2018-07-20 DIAGNOSIS — N816 Rectocele: Secondary | ICD-10-CM | POA: Diagnosis not present

## 2018-07-20 HISTORY — PX: ANTERIOR AND POSTERIOR REPAIR WITH SACROSPINOUS FIXATION: SHX6536

## 2018-07-20 HISTORY — PX: LAPAROSCOPIC VAGINAL HYSTERECTOMY WITH SALPINGO OOPHORECTOMY: SHX6681

## 2018-07-20 HISTORY — PX: BLADDER SUSPENSION: SHX72

## 2018-07-20 SURGERY — HYSTERECTOMY, VAGINAL, LAPAROSCOPY-ASSISTED, WITH SALPINGO-OOPHORECTOMY
Anesthesia: General | Site: Vagina

## 2018-07-20 MED ORDER — NITROGLYCERIN 0.4 MG SL SUBL
0.4000 mg | SUBLINGUAL_TABLET | SUBLINGUAL | Status: DC | PRN
  Filled 2018-07-20: qty 25

## 2018-07-20 MED ORDER — FENTANYL CITRATE (PF) 250 MCG/5ML IJ SOLN
INTRAMUSCULAR | Status: AC
  Filled 2018-07-20: qty 5

## 2018-07-20 MED ORDER — HYDROMORPHONE 1 MG/ML IV SOLN
INTRAVENOUS | Status: DC
  Administered 2018-07-20: 0.2 mg via INTRAVENOUS
  Administered 2018-07-20: 11:00:00 via INTRAVENOUS
  Filled 2018-07-20: qty 25

## 2018-07-20 MED ORDER — SUGAMMADEX SODIUM 200 MG/2ML IV SOLN
INTRAVENOUS | Status: AC
  Filled 2018-07-20: qty 2

## 2018-07-20 MED ORDER — FENTANYL CITRATE (PF) 100 MCG/2ML IJ SOLN: 25.0000 ug | INTRAMUSCULAR | Status: DC | PRN

## 2018-07-20 MED ORDER — MIDAZOLAM HCL 2 MG/2ML IJ SOLN
INTRAMUSCULAR | Status: DC | PRN
  Administered 2018-07-20: 1 mg via INTRAVENOUS

## 2018-07-20 MED ORDER — DIPHENHYDRAMINE HCL 12.5 MG/5ML PO ELIX
12.5000 mg | ORAL_SOLUTION | Freq: Four times a day (QID) | ORAL | Status: DC | PRN
  Administered 2018-07-20: 12.5 mg via ORAL
  Filled 2018-07-20 (×2): qty 5

## 2018-07-20 MED ORDER — CELECOXIB 200 MG PO CAPS
ORAL_CAPSULE | ORAL | Status: AC
  Filled 2018-07-20: qty 1

## 2018-07-20 MED ORDER — ONDANSETRON HCL 4 MG/2ML IJ SOLN: 4.0000 mg | Freq: Once | INTRAMUSCULAR | Status: DC | PRN

## 2018-07-20 MED ORDER — SODIUM CHLORIDE 0.9 % IR SOLN
Status: DC | PRN
  Administered 2018-07-20: 500 mL

## 2018-07-20 MED ORDER — LIDOCAINE-EPINEPHRINE (PF) 1 %-1:200000 IJ SOLN
INTRAMUSCULAR | Status: DC | PRN
  Administered 2018-07-20: 10 mL

## 2018-07-20 MED ORDER — PROPOFOL 10 MG/ML IV BOLUS
INTRAVENOUS | Status: DC | PRN
  Administered 2018-07-20: 150 mg via INTRAVENOUS

## 2018-07-20 MED ORDER — BUPIVACAINE HCL (PF) 0.25 % IJ SOLN
INTRAMUSCULAR | Status: DC | PRN
  Administered 2018-07-20: 7 mL

## 2018-07-20 MED ORDER — FAMOTIDINE 20 MG PO TABS
20.0000 mg | ORAL_TABLET | Freq: Once | ORAL | Status: AC
  Administered 2018-07-20: 20 mg via ORAL

## 2018-07-20 MED ORDER — LOSARTAN POTASSIUM 50 MG PO TABS
100.0000 mg | ORAL_TABLET | Freq: Every day | ORAL | Status: DC
  Administered 2018-07-20: 100 mg via ORAL
  Filled 2018-07-20 (×2): qty 2

## 2018-07-20 MED ORDER — ROCURONIUM BROMIDE 100 MG/10ML IV SOLN
INTRAVENOUS | Status: DC | PRN
  Administered 2018-07-20: 40 mg via INTRAVENOUS

## 2018-07-20 MED ORDER — ONDANSETRON HCL 4 MG PO TABS: 4.0000 mg | ORAL_TABLET | Freq: Four times a day (QID) | ORAL | Status: DC | PRN

## 2018-07-20 MED ORDER — HYDROCODONE-ACETAMINOPHEN 5-325 MG PO TABS: 1.0000 | ORAL_TABLET | ORAL | Status: DC | PRN

## 2018-07-20 MED ORDER — BUPIVACAINE HCL (PF) 0.25 % IJ SOLN
INTRAMUSCULAR | Status: AC
  Filled 2018-07-20: qty 30

## 2018-07-20 MED ORDER — DIPHENHYDRAMINE HCL 50 MG/ML IJ SOLN: 12.5000 mg | Freq: Four times a day (QID) | INTRAMUSCULAR | Status: DC | PRN

## 2018-07-20 MED ORDER — ACETAMINOPHEN 500 MG PO TABS
1000.0000 mg | ORAL_TABLET | Freq: Once | ORAL | Status: AC
  Administered 2018-07-20: 1000 mg via ORAL

## 2018-07-20 MED ORDER — ONDANSETRON HCL 4 MG/2ML IJ SOLN
4.0000 mg | Freq: Four times a day (QID) | INTRAMUSCULAR | Status: DC | PRN
  Filled 2018-07-20: qty 2

## 2018-07-20 MED ORDER — MIDAZOLAM HCL 2 MG/2ML IJ SOLN
INTRAMUSCULAR | Status: AC
  Filled 2018-07-20: qty 2

## 2018-07-20 MED ORDER — LACTATED RINGERS IV SOLN
INTRAVENOUS | Status: DC
  Administered 2018-07-20: 09:00:00 via INTRAVENOUS
  Administered 2018-07-20: 125 mL/h via INTRAVENOUS

## 2018-07-20 MED ORDER — ONDANSETRON HCL 4 MG/2ML IJ SOLN
INTRAMUSCULAR | Status: DC | PRN
  Administered 2018-07-20: 4 mg via INTRAVENOUS

## 2018-07-20 MED ORDER — EPHEDRINE SULFATE 50 MG/ML IJ SOLN
INTRAMUSCULAR | Status: DC | PRN
  Administered 2018-07-20: 10 mg via INTRAVENOUS
  Administered 2018-07-20: 5 mg via INTRAVENOUS

## 2018-07-20 MED ORDER — ACETAMINOPHEN 500 MG PO TABS
ORAL_TABLET | ORAL | Status: AC
  Filled 2018-07-20: qty 2

## 2018-07-20 MED ORDER — ACETAMINOPHEN 325 MG PO TABS
650.0000 mg | ORAL_TABLET | ORAL | Status: DC | PRN
  Administered 2018-07-20 (×2): 650 mg via ORAL
  Filled 2018-07-20 (×2): qty 2

## 2018-07-20 MED ORDER — CARVEDILOL 25 MG PO TABS
25.0000 mg | ORAL_TABLET | Freq: Two times a day (BID) | ORAL | Status: DC
  Administered 2018-07-20: 25 mg via ORAL
  Filled 2018-07-20 (×2): qty 1

## 2018-07-20 MED ORDER — DEXAMETHASONE SODIUM PHOSPHATE 10 MG/ML IJ SOLN
INTRAMUSCULAR | Status: DC | PRN
  Administered 2018-07-20: 4 mg via INTRAVENOUS

## 2018-07-20 MED ORDER — STERILE WATER FOR IRRIGATION IR SOLN
Status: DC | PRN
  Administered 2018-07-20: 1000 mL

## 2018-07-20 MED ORDER — KETOROLAC TROMETHAMINE 30 MG/ML IJ SOLN
INTRAMUSCULAR | Status: DC | PRN
  Administered 2018-07-20: 15 mg via INTRAVENOUS

## 2018-07-20 MED ORDER — ACETAMINOPHEN 160 MG/5ML PO SOLN: 960.0000 mg | Freq: Once | ORAL | Status: AC

## 2018-07-20 MED ORDER — ONDANSETRON HCL 4 MG/2ML IJ SOLN
4.0000 mg | Freq: Four times a day (QID) | INTRAMUSCULAR | Status: DC | PRN
  Administered 2018-07-20: 4 mg via INTRAVENOUS

## 2018-07-20 MED ORDER — LIDOCAINE HCL (CARDIAC) PF 100 MG/5ML IV SOSY
PREFILLED_SYRINGE | INTRAVENOUS | Status: DC | PRN
  Administered 2018-07-20: 60 mg via INTRAVENOUS

## 2018-07-20 MED ORDER — LIDOCAINE-EPINEPHRINE (PF) 1 %-1:200000 IJ SOLN
INTRAMUSCULAR | Status: AC
  Filled 2018-07-20: qty 30

## 2018-07-20 MED ORDER — HYDROCHLOROTHIAZIDE 25 MG PO TABS
25.0000 mg | ORAL_TABLET | Freq: Every day | ORAL | Status: DC
  Administered 2018-07-20: 25 mg via ORAL
  Filled 2018-07-20: qty 1

## 2018-07-20 MED ORDER — FAMOTIDINE 20 MG PO TABS
ORAL_TABLET | ORAL | Status: AC
  Filled 2018-07-20: qty 1

## 2018-07-20 MED ORDER — NALOXONE HCL 0.4 MG/ML IJ SOLN: 0.4000 mg | INTRAMUSCULAR | Status: DC | PRN

## 2018-07-20 MED ORDER — LACTATED RINGERS IV SOLN
INTRAVENOUS | Status: DC
  Administered 2018-07-20: 16:00:00 via INTRAVENOUS

## 2018-07-20 MED ORDER — GENTAMICIN SULFATE 40 MG/ML IJ SOLN
INTRAMUSCULAR | Status: AC
  Administered 2018-07-20: 325 mg via INTRAVENOUS
  Filled 2018-07-20: qty 8.25

## 2018-07-20 MED ORDER — CELECOXIB 200 MG PO CAPS
200.0000 mg | ORAL_CAPSULE | Freq: Once | ORAL | Status: AC | PRN
  Administered 2018-07-20: 200 mg via ORAL

## 2018-07-20 MED ORDER — ESTRADIOL 0.1 MG/GM VA CREA
TOPICAL_CREAM | VAGINAL | Status: AC
  Filled 2018-07-20: qty 42.5

## 2018-07-20 MED ORDER — SUGAMMADEX SODIUM 200 MG/2ML IV SOLN
INTRAVENOUS | Status: DC | PRN
  Administered 2018-07-20: 200 mg via INTRAVENOUS

## 2018-07-20 MED ORDER — MENTHOL 3 MG MT LOZG
1.0000 | LOZENGE | OROMUCOSAL | Status: DC | PRN
  Filled 2018-07-20: qty 9

## 2018-07-20 MED ORDER — FENTANYL CITRATE (PF) 100 MCG/2ML IJ SOLN
INTRAMUSCULAR | Status: DC | PRN
  Administered 2018-07-20: 50 ug via INTRAVENOUS
  Administered 2018-07-20: 100 ug via INTRAVENOUS

## 2018-07-20 MED ORDER — FLUORESCEIN SODIUM 10 % IV SOLN
INTRAVENOUS | Status: AC
  Filled 2018-07-20: qty 5

## 2018-07-20 MED ORDER — SODIUM CHLORIDE 0.9% FLUSH: 9.0000 mL | INTRAVENOUS | Status: DC | PRN

## 2018-07-20 SURGICAL SUPPLY — 67 items
ADH SKN CLS APL DERMABOND .7 (GAUZE/BANDAGES/DRESSINGS) ×4
BLADE SURG 15 STRL LF C SS BP (BLADE) ×3 IMPLANT
BLADE SURG 15 STRL SS (BLADE) ×4
BNDG GAUZE ELAST 4 BULKY (GAUZE/BANDAGES/DRESSINGS) ×5 IMPLANT
CABLE HIGH FREQUENCY MONO STRZ (ELECTRODE) IMPLANT
CANISTER SUCT 3000ML PPV (MISCELLANEOUS) ×5 IMPLANT
CATH ROBINSON RED A/P 16FR (CATHETERS) ×5 IMPLANT
CLOSURE WOUND 1/4 X3 (GAUZE/BANDAGES/DRESSINGS)
CONT PATH 16OZ SNAP LID 3702 (MISCELLANEOUS) ×5 IMPLANT
COVER BACK TABLE 60X90IN (DRAPES) ×5 IMPLANT
COVER MAYO STAND STRL (DRAPES) ×5 IMPLANT
DECANTER SPIKE VIAL GLASS SM (MISCELLANEOUS) ×7 IMPLANT
DERMABOND ADVANCED (GAUZE/BANDAGES/DRESSINGS) ×4
DERMABOND ADVANCED .7 DNX12 (GAUZE/BANDAGES/DRESSINGS) ×3 IMPLANT
DEVICE CAPIO SLIM SINGLE (INSTRUMENTS) ×5 IMPLANT
DILATOR CANAL MILEX (MISCELLANEOUS) ×2 IMPLANT
DRSG OPSITE POSTOP 3X4 (GAUZE/BANDAGES/DRESSINGS) IMPLANT
DURAPREP 26ML APPLICATOR (WOUND CARE) ×5 IMPLANT
ELECT REM PT RETURN 9FT ADLT (ELECTROSURGICAL) ×4
ELECTRODE REM PT RTRN 9FT ADLT (ELECTROSURGICAL) IMPLANT
FILTER SMOKE EVAC LAPAROSHD (FILTER) IMPLANT
GLOVE BIO SURGEON STRL SZ7 (GLOVE) ×10 IMPLANT
GLOVE BIOGEL PI IND STRL 6.5 (GLOVE) ×3 IMPLANT
GLOVE BIOGEL PI IND STRL 7.0 (GLOVE) ×9 IMPLANT
GLOVE BIOGEL PI INDICATOR 6.5 (GLOVE) ×2
GLOVE BIOGEL PI INDICATOR 7.0 (GLOVE) ×6
GOWN STRL REUS W/TWL LRG LVL3 (GOWN DISPOSABLE) ×20 IMPLANT
NDL MAYO 6 CRC TAPER PT (NEEDLE) ×3 IMPLANT
NEEDLE HYPO 22GX1.5 SAFETY (NEEDLE) ×5 IMPLANT
NEEDLE INSUFFLATION 120MM (ENDOMECHANICALS) IMPLANT
NEEDLE MAYO 6 CRC TAPER PT (NEEDLE) ×4 IMPLANT
NS IRRIG 1000ML POUR BTL (IV SOLUTION) ×5 IMPLANT
PACK LAVH (CUSTOM PROCEDURE TRAY) ×5 IMPLANT
PACK ROBOTIC GOWN (GOWN DISPOSABLE) ×5 IMPLANT
PACK TRENDGUARD 450 HYBRID PRO (MISCELLANEOUS) IMPLANT
PACK VAGINAL WOMENS (CUSTOM PROCEDURE TRAY) ×5 IMPLANT
PROTECTOR NERVE ULNAR (MISCELLANEOUS) ×10 IMPLANT
SEALER TISSUE G2 CVD JAW 45CM (ENDOMECHANICALS) ×5 IMPLANT
SET CYSTO W/LG BORE CLAMP LF (SET/KITS/TRAYS/PACK) ×5 IMPLANT
SET IRRIG TUBING LAPAROSCOPIC (IRRIGATION / IRRIGATOR) IMPLANT
SLEEVE XCEL OPT CAN 5 100 (ENDOMECHANICALS) ×5 IMPLANT
SLING HALO OBTRYX (Sling) ×4 IMPLANT
SLING HALO OBTRYX NDL (Sling) IMPLANT
STRIP CLOSURE SKIN 1/4X3 (GAUZE/BANDAGES/DRESSINGS) IMPLANT
SUT CAPIO POLYGLYCOLIC (SUTURE) ×5 IMPLANT
SUT MON AB 2-0 CT1 27 (SUTURE) IMPLANT
SUT MON AB 3-0 SH 27 (SUTURE)
SUT MON AB 3-0 SH27 (SUTURE) IMPLANT
SUT VIC AB 0 CT1 18XCR BRD8 (SUTURE) ×6 IMPLANT
SUT VIC AB 0 CT1 27 (SUTURE) ×8
SUT VIC AB 0 CT1 27XBRD ANBCTR (SUTURE) ×6 IMPLANT
SUT VIC AB 0 CT1 36 (SUTURE) ×5 IMPLANT
SUT VIC AB 0 CT1 8-18 (SUTURE) ×8
SUT VIC AB 2-0 CT1 27 (SUTURE)
SUT VIC AB 2-0 CT1 TAPERPNT 27 (SUTURE) IMPLANT
SUT VIC AB 2-0 CT2 27 (SUTURE) ×28 IMPLANT
SUT VIC AB 4-0 PS2 27 (SUTURE) ×5 IMPLANT
SUT VICRYL 0 UR6 27IN ABS (SUTURE) ×2 IMPLANT
SUT VICRYL 1 TIES 12X18 (SUTURE) ×5 IMPLANT
TOWEL OR 17X24 6PK STRL BLUE (TOWEL DISPOSABLE) ×10 IMPLANT
TRAY FOLEY W/BAG SLVR 14FR (SET/KITS/TRAYS/PACK) ×5 IMPLANT
TRENDGUARD 450 HYBRID PRO PACK (MISCELLANEOUS) ×4
TROCAR BALLN 12MMX100 BLUNT (TROCAR) ×2 IMPLANT
TROCAR OPTI TIP 5M 100M (ENDOMECHANICALS) ×5 IMPLANT
TROCAR XCEL DIL TIP R 11M (ENDOMECHANICALS) IMPLANT
TUBING INSUF HEATED (TUBING) ×5 IMPLANT
WARMER LAPAROSCOPE (MISCELLANEOUS) ×5 IMPLANT

## 2018-07-20 NOTE — Anesthesia Procedure Notes (Signed)
Procedure Name: Intubation Date/Time: 07/20/2018 7:28 AM Performed by: Kathie Rhodes, CRNA Pre-anesthesia Checklist: Patient identified, Emergency Drugs available, Suction available, Patient being monitored and Timeout performed Patient Re-evaluated:Patient Re-evaluated prior to induction Oxygen Delivery Method: Circle system utilized Preoxygenation: Pre-oxygenation with 100% oxygen Induction Type: IV induction Ventilation: Mask ventilation without difficulty Laryngoscope Size: Miller and 2 Grade View: Grade III Tube type: Oral Tube size: 7.0 mm Number of attempts: 1 Airway Equipment and Method: Bougie stylet Placement Confirmation: ETT inserted through vocal cords under direct vision,  positive ETCO2 and CO2 detector Secured at: 21 cm Tube secured with: Tape Dental Injury: Teeth and Oropharynx as per pre-operative assessment  Difficulty Due To: Difficult Airway- due to anterior larynx and Difficulty was anticipated

## 2018-07-20 NOTE — H&P (Signed)
  History and physical exam unchanged 

## 2018-07-20 NOTE — Progress Notes (Signed)
Patient ID: Jennifer Gross, female   DOB: [birth year], 77 y.o.   MRN: [id number] AF VSS ABD SOFT INC CLEAR MINIMAL SPOTTING GOOD UO CONTINUE POST OP MANAGEMENT

## 2018-07-20 NOTE — Op Note (Signed)
NAME: Jennifer Gross, Jennifer Gross MEDICAL RECORD [id number] ACCOUNT [id number] DATE OF BIRTH:06-22-1941 FACILITY: [facility] LOCATION: [facility] PHYSICIAN:Imunique Samad Sherran Needs, MD  OPERATIVE REPORT  DATE OF PROCEDURE:  07/20/2018  PREOPERATIVE DIAGNOSIS:  Symptomatic pelvic relaxation with stress urinary incontinence.  POSTOPERATIVE DIAGNOSIS:  Symptomatic pelvic relaxation with stress urinary incontinence.  PROCEDURE PERFORMED:  Laparoscopic-assisted vaginal hysterectomy, bilateral salpingo-oophorectomy.  Anterior and posterior repair.  Sacrospinous ligament suspension.  Midurethral sling and cystoscopy.  SURGEON:  Arvella Nigh, MD  ASSISTANT:  Lucillie Garfinkel, MD  ESTIMATED BLOOD LOSS:  250 mL  PACKS:  Include a vaginal pack.  DRAINS:  Urethral Foley.  SPECIMENS:  None.    INTRAOPERATIVE BLOOD PLACEMENT:  None.  INDICATIONS:  Dictated in the history and physical procedure.    DESCRIPTION OF PROCEDURE:  The patient was taken to the OR and placed in supine position.  After satisfactory level of general endotracheal anesthesia was obtained, she was placed in dorsal lithotomy position using the Allen stirrups.  The perineum and  vagina were prepped out with Betadine.  Bladder was emptied out by catheterization.  A Hulka tenaculum was put in place and secured.  She had significant uterine prolapse.  The abdomen was prepped with DuraPrep.  After a time of setting up, the patient  was draped in sterile field.  Subumbilical incision was made with knife, extended to the subcutaneous tissue.  Fascia was identified and sharp incision in the fascia laterally.  Peritoneum was entered with blunt finger pressure.  The open laparoscopic  trocar was put in place and secured and insufflated with carbon dioxide.  Laparoscope was introduced.  There was no evidence of injury to adjacent organs.  The upper abdomen including liver tip of the gallbladder, both lateral gutters were clear.  A 5 mm  trocar was put in place,  suprapubic area under direct visualization.  Uterus was small.  Both tubes and ovaries are unremarkable.  The path of the ureters were identified.  First, the right tube and ovary was elevated.  The right ovarian vasculature was  cauterized and incised using the Enseal.  We will continue to cauterize an incision, freeing the tube and ovary from its peritoneal attachments up to the right round ligament.  Then, the right round ligament was cauterized and incised.  We then went to  the left side.  Left tube and ovary were elevated.  The left ovarian vasculature was cauterized and incised using the Enseal.  We came to the process of cautery incision separating the tubes and ovaries from its peritoneal attachment up to the left round  ligament, which was then cauterized and incised.  We had good hemostasis.  Decision know was to go vaginally.  The laparoscope was removed.  Abdomen was desufflated of carbon dioxide.  The patient's legs were repositioned.  A weighted speculum was placed in the vaginal vault.  The Hulka tenaculum was then removed.  The cervix grasped with a Jacobs tenaculum.   Cul-de-sac was entered sharply.  Both uterosacral ligaments were clamped, cut and suture ligated with 0 Vicryl.  The reflection of the vaginal mucosa anteriorly was incised and bladder was dissected superiorly.  Paracervical tissues were clamped, cut and  suture ligated with 0 Vicryl.  Vesicouterine space was identified and entered sharply.  Using the clamp, cut, and tie technique with suture ligatures of 0 Vicryl, the parametrium was serially separated from the inside the uterus.  Uterus was then  flipped.  The remaining pedicles were clamped and cut.  The uterus,  tubes and ovaries were passed off the operative field.  Held pedicles secured with free tie of 0 Vicryl.  Attention was now turned to the anterior repair.  The vaginal mucosa was infiltrated with 1% Xylocaine.  Using blunt and sharp dissection, the vaginal mucosa  was separated from the underlying bladder.  We then incised it in the middle.  Again, using  sharp dissection, the tissue attached to the vaginal mucosa was dissected from it.  At this point in time, we reduced the cystocele with interrupted sutures of 2-0 Vicryl.  We had good reduction.  The vaginal edges were trimmed and reapproximated with  interrupted sutures of 2-0 Vicryl.  The remaining vaginal mucosa was then closed.  Of note, we did do a running suture posterior of 0 chromic.  The uterosacral plication stitch of 0 chromic was put in place.  Now, the vaginal mucosa was reapproximated  with interrupted sutures of 0 Vicryl.  We had good approximation and hemostasis.  Attention was now turned to the posterior repair.  The perineum and vagina infiltrated with 1% Xylocaine with epinephrine.  A V incision was made over the perineal body using the knife.  The skin was dissected up to the vaginal opening and incised.  We began separating the vaginal mucosa from the  underlying perirectal fascia.  We then made an incision in the vaginal mucosa.  We continued the dissection, separating again the perirectal fascia from the overlying vaginal mucosa.  We had good separation.  We dissected out to the right side,  identified the sacrospinous ligament.  Using the Capio, a suture of 0 Vicryl was put through the sacrospinous ligament.  Using a free Mayo needle, it was secured to the vaginal apex and held.  Perirectal fascia was then brought together in the midline  with interrupted sutures of 2-0 Vicryl, completely obliterating the rectocele.  At this point in time, we tied down the sacrospinous ligament suspension with good elevation of the vaginal cuff.  The vaginal mucosa was then reapproximated with interrupted  sutures of 2-0 Vicryl.  Perineal body was rebuilt with 2-0 Vicryl and the perineal skin was closed with 2-0 Vicryl.  We had good hemostasis and good reduction of the rectocele.  Next, we turned to the  transobturator sling.  Two areas on the groin were identified below the adductus longus muscle about the area of the clitoris next to the inferior pubic ramus.  These were marked.  The vaginal mucosa was infiltrated with 1%  Xylocaine.  Using a knife, an incision was made.  We then using blunt dissection and sharp dissection, dissected out laterally to the obturator foramen.  A punch incision was made in the skin.  The needle probes were then placed through the skin incision  and out through the vaginal incision on both sides.  A cystoscopy was then performed.  There is no evidence of injury to the bladder.  Both ureteral orifices were identified and noted to be freely spilling jets of clear urine.  Cystoscope was then  removed.  The sling material was placed on both of the needles and brought out through the skin incision.  We then cut the plastic tab.  Using a Claiborne Billings, we removed the plastic sheath thrombus from the mesh.  The mesh was adjusted under the suburethral  area until it laid flat but you could easily rotate a Kelly 90 degrees.  The long arms were cut at the level of the skin.  Skin was closed with  Dermabond.  Vaginal mucosa was closed with a running locking suture of 2-0 Vicryl.  At this point in time, cystoscopy was repeated.  There was no evidence of any active bleeding.  We thoroughly irrigated the pelvis and had good hemostasis.  The laparoscope and trocars were removed.  Subumbilical fascia closed with figure-of-eight 0  Vicryl.  Skin was closed with interrupted subcuticulars of 4-0 Vicryl.  Suprapubic incisions were closed with Dermabond.  A vaginal packing was put in place and Foley was placed to straight drain.  Urine output was clear and adequate.  At this point in  time, the patient was taken out of dorsal lithotomy position once alert and extubated and transferred to recovery room in good condition.  Sponge, instrument and needle count was correct by circulating nurse multiple  times.  TN/NUANCE  D:07/20/2018 T:07/20/2018 JOB:[id number]

## 2018-07-20 NOTE — Transfer of Care (Signed)
Immediate Anesthesia Transfer of Care Note  Patient: Jennifer Gross  Procedure(s) Performed: LAPAROSCOPIC ASSISTED VAGINAL HYSTERECTOMY WITH SALPINGO OOPHORECTOMY (Bilateral Abdomen) ANTERIOR AND POSTERIOR REPAIR WITH SACROSPINOUS FIXATION (N/A Vagina ) TRANSOBTURATOR SLING, CYSTO (N/A Vagina )  Patient Location: PACU  Anesthesia Type:General  Level of Consciousness: awake, alert  and oriented  Airway & Oxygen Therapy: Patient Spontanous Breathing and Patient connected to nasal cannula oxygen  Post-op Assessment: Report given to RN, Post -op Vital signs reviewed and stable and Patient moving all extremities X 4  Post vital signs: Reviewed and stable  Last Vitals:  Vitals Value Taken Time  BP    Temp    Pulse 62 07/20/2018  9:37 AM  Resp 16 07/20/2018  9:37 AM  SpO2 99 % 07/20/2018  9:37 AM  Vitals shown include unvalidated device data.  Last Pain:  Vitals:   07/20/18 0600  TempSrc: Oral      Patients Stated Pain Goal: 4 (48/27/07 8675)  Complications: No apparent anesthesia complications

## 2018-07-20 NOTE — Brief Op Note (Signed)
Patient name  Jennifer Gross, [place] DICTATION# [id number] CSN# [id number]  [facility], MD 07/20/2018 9:35 AM

## 2018-07-20 NOTE — Anesthesia Postprocedure Evaluation (Signed)
Anesthesia Post Note  Patient: Jennifer Gross  Procedure(s) Performed: LAPAROSCOPIC ASSISTED VAGINAL HYSTERECTOMY WITH SALPINGO OOPHORECTOMY (Bilateral Abdomen) ANTERIOR AND POSTERIOR REPAIR WITH SACROSPINOUS FIXATION (N/A Vagina ) TRANSOBTURATOR SLING, CYSTO (N/A Vagina )     Patient location during evaluation: PACU Anesthesia Type: General Level of consciousness: awake and alert Pain management: pain level controlled Vital Signs Assessment: post-procedure vital signs reviewed and stable Respiratory status: spontaneous breathing, nonlabored ventilation, respiratory function stable and patient connected to nasal cannula oxygen Cardiovascular status: blood pressure returned to baseline and stable Postop Assessment: no apparent nausea or vomiting Anesthetic complications: no    Last Vitals:  Vitals:   07/20/18 1415 07/20/18 1427  BP: (!) 141/58 (!) 141/58  Pulse: 63 63  Resp: 18 18  Temp: 36.5 C   SpO2: 100% 100%    Last Pain:  Vitals:   07/20/18 1415  TempSrc: Oral  PainSc:    Pain Goal: Patients Stated Pain Goal: 4 (07/20/18 1353)               Ryan P Ellender

## 2018-07-21 DIAGNOSIS — N816 Rectocele: Secondary | ICD-10-CM | POA: Diagnosis not present

## 2018-07-21 DIAGNOSIS — D271 Benign neoplasm of left ovary: Secondary | ICD-10-CM | POA: Diagnosis not present

## 2018-07-21 DIAGNOSIS — N8189 Other female genital prolapse: Secondary | ICD-10-CM | POA: Diagnosis not present

## 2018-07-21 DIAGNOSIS — N393 Stress incontinence (female) (male): Secondary | ICD-10-CM | POA: Diagnosis not present

## 2018-07-21 DIAGNOSIS — I2510 Atherosclerotic heart disease of native coronary artery without angina pectoris: Secondary | ICD-10-CM | POA: Diagnosis not present

## 2018-07-21 DIAGNOSIS — N880 Leukoplakia of cervix uteri: Secondary | ICD-10-CM | POA: Diagnosis not present

## 2018-07-21 DIAGNOSIS — I252 Old myocardial infarction: Secondary | ICD-10-CM | POA: Diagnosis not present

## 2018-07-21 DIAGNOSIS — K219 Gastro-esophageal reflux disease without esophagitis: Secondary | ICD-10-CM | POA: Diagnosis not present

## 2018-07-21 DIAGNOSIS — N3641 Hypermobility of urethra: Secondary | ICD-10-CM | POA: Diagnosis not present

## 2018-07-21 LAB — CBC
HCT: 33 % — ABNORMAL LOW (ref 36.0–46.0)
HEMOGLOBIN: 11.5 g/dL — AB (ref 12.0–15.0)
MCH: 31.9 pg (ref 26.0–34.0)
MCHC: 34.8 g/dL (ref 30.0–36.0)
MCV: 91.7 fL (ref 78.0–100.0)
Platelets: 208 10*3/uL (ref 150–400)
RBC: 3.6 MIL/uL — AB (ref 3.87–5.11)
RDW: 15.2 % (ref 11.5–15.5)
WBC: 18 10*3/uL — AB (ref 4.0–10.5)

## 2018-07-21 NOTE — Discharge Summary (Signed)
NAME: Jennifer Gross, Jennifer Gross MEDICAL RECORD [id number] ACCOUNT [id number] DATE OF BIRTH:1941/06/15 FACILITY: [facility] LOCATION: [id number] PHYSICIAN:Kasheena Sambrano Sherran Needs, MD  DISCHARGE SUMMARY  DATE OF DISCHARGE:  07/21/2018  DATE OF ADMISSION:  07/20/2018  DATE OF DISCHARGE:  07/21/2018.  ADMITTING DIAGNOSES:  Pelvic relaxation and stress urinary incontinence.  DISCHARGE DIAGNOSES:  Pelvic relaxation and stress urinary incontinence.  OPERATIVE PROCEDURE:  Laparoscopic assisted vaginal hysterectomy with removal of both tubes and ovaries.  Vaginal repair of cystocele.  Vaginal repair of rectocele along with sacrospinous ligament suspension.  Midurethral sling using the transobturator  approach.  Cystoscopy.    For complete history and physical, please see dictated note.  HOSPITAL COURSE:  The patient underwent above noted surgery.  Postop did extremely well.  On her first postoperative day, the pack was discontinued.  We instilled about 350 mL of fluid within her bladder.  She was able to void dose out completely.  So,  the Foley was left out.    Her hemoglobin was 11.5.  She was tolerating her diet and ambulating without difficulty.  All incisions were clear.  She was having minimal spotting at the time of exam.  COMPLICATIONS:  None encountered stay in the hospital.  DISPOSITION:  Patient is discharged home in stable condition.  She is instructed to avoid heavy lifting, vaginal entrance or driving a car.  Instructed to call should there be heavy vaginal bleeding.  Severe pain should be reported.  Nausea, vomiting  should be reported.  Any fever should be reported.  Instructed in signs and symptoms of deep venous thrombosis and pulmonary embolus.    Discharged home to use Percocet as needed for pain and will be called tomorrow to arrange followup.  AN/NUANCE D:07/21/2018 T:07/21/2018 JOB:[id number]

## 2018-07-21 NOTE — Progress Notes (Signed)
1 Day Post-Op Procedure(s) (LRB): LAPAROSCOPIC ASSISTED VAGINAL HYSTERECTOMY WITH SALPINGO OOPHORECTOMY (Bilateral) ANTERIOR AND POSTERIOR REPAIR WITH SACROSPINOUS FIXATION (N/A) TRANSOBTURATOR SLING, CYSTO (N/A)  Subjective: Patient reports tolerating PO and no problems voiding.    Objective: I have reviewed patient's vital signs, intake and output and labs.  General: alert GI: soft, non-tender; bowel sounds normal; no masses,  no organomegaly Vaginal Bleeding: minimal  Assessment: s/p Procedure(s): LAPAROSCOPIC ASSISTED VAGINAL HYSTERECTOMY WITH SALPINGO OOPHORECTOMY (Bilateral) ANTERIOR AND POSTERIOR REPAIR WITH SACROSPINOUS FIXATION (N/A) TRANSOBTURATOR SLING, CYSTO (N/A): stable  Plan: Discharge home  LOS: 0 days    Jennifer Gross S 07/21/2018, 9:18 AM

## 2018-07-21 NOTE — Progress Notes (Signed)
Discharge instructions and prescriptions given to pt. Discussed post-op care, signs and symptoms to report to the MD (fever, uncontrolled pain, purulent drainage from operative site, difficulty urinating etc), upcoming appointments, and meds. Pt verbalized understanding using teach-back and has no questions or concerns at this time. Both IV's taken out and pt tolerated well. Pt discharged in stable condition.

## 2018-07-21 NOTE — Anesthesia Postprocedure Evaluation (Signed)
Anesthesia Post Note  Patient: Jennifer Gross  Procedure(s) Performed: LAPAROSCOPIC ASSISTED VAGINAL HYSTERECTOMY WITH SALPINGO OOPHORECTOMY (Bilateral Abdomen) ANTERIOR AND POSTERIOR REPAIR WITH SACROSPINOUS FIXATION (N/A Vagina ) TRANSOBTURATOR SLING, CYSTO (N/A Vagina )     Patient location during evaluation: [facility] Anesthesia Type: General Level of consciousness: awake and alert and oriented Pain management: pain level controlled Vital Signs Assessment: post-procedure vital signs reviewed and stable Respiratory status: spontaneous breathing and nonlabored ventilation Cardiovascular status: stable Postop Assessment: no apparent nausea or vomiting, adequate PO intake and able to ambulate Anesthetic complications: no    Last Vitals:  Vitals:   07/21/18 0347 07/21/18 0820  BP: (!) 129/56 (!) 142/58  Pulse: 66 64  Resp: 18 18  Temp: 36.7 C 36.8 C  SpO2: 94%     Last Pain:  Vitals:   07/21/18 0820  TempSrc: Oral  PainSc:    Pain Goal: Patients Stated Pain Goal: 4 (07/20/18 2150)               Jabier Mutton

## 2018-07-21 NOTE — Addendum Note (Signed)
Addendum  created 07/21/18 1121 by Hewitt Blade, CRNA   Sign clinical note

## 2018-07-21 NOTE — Discharge Summary (Signed)
Patient name Jennifer Gross, [place] DICTATION# [id number] CSN# [id number]  [facility], MD 07/21/2018 9:21 AM

## 2018-07-31 ENCOUNTER — Encounter (HOSPITAL_COMMUNITY): Payer: Self-pay | Admitting: Obstetrics and Gynecology

## 2018-09-04 ENCOUNTER — Other Ambulatory Visit: Payer: Self-pay | Admitting: Cardiovascular Disease

## 2018-09-18 IMAGING — DX DG KNEE 1-2V*R*
2 series · 2 of 2 positions shown · non-contrast
Comparison: No recent .

CLINICAL DATA: Right knee pain and swelling.

EXAM:
RIGHT KNEE - 1-2 VIEW

[knee standing ap]
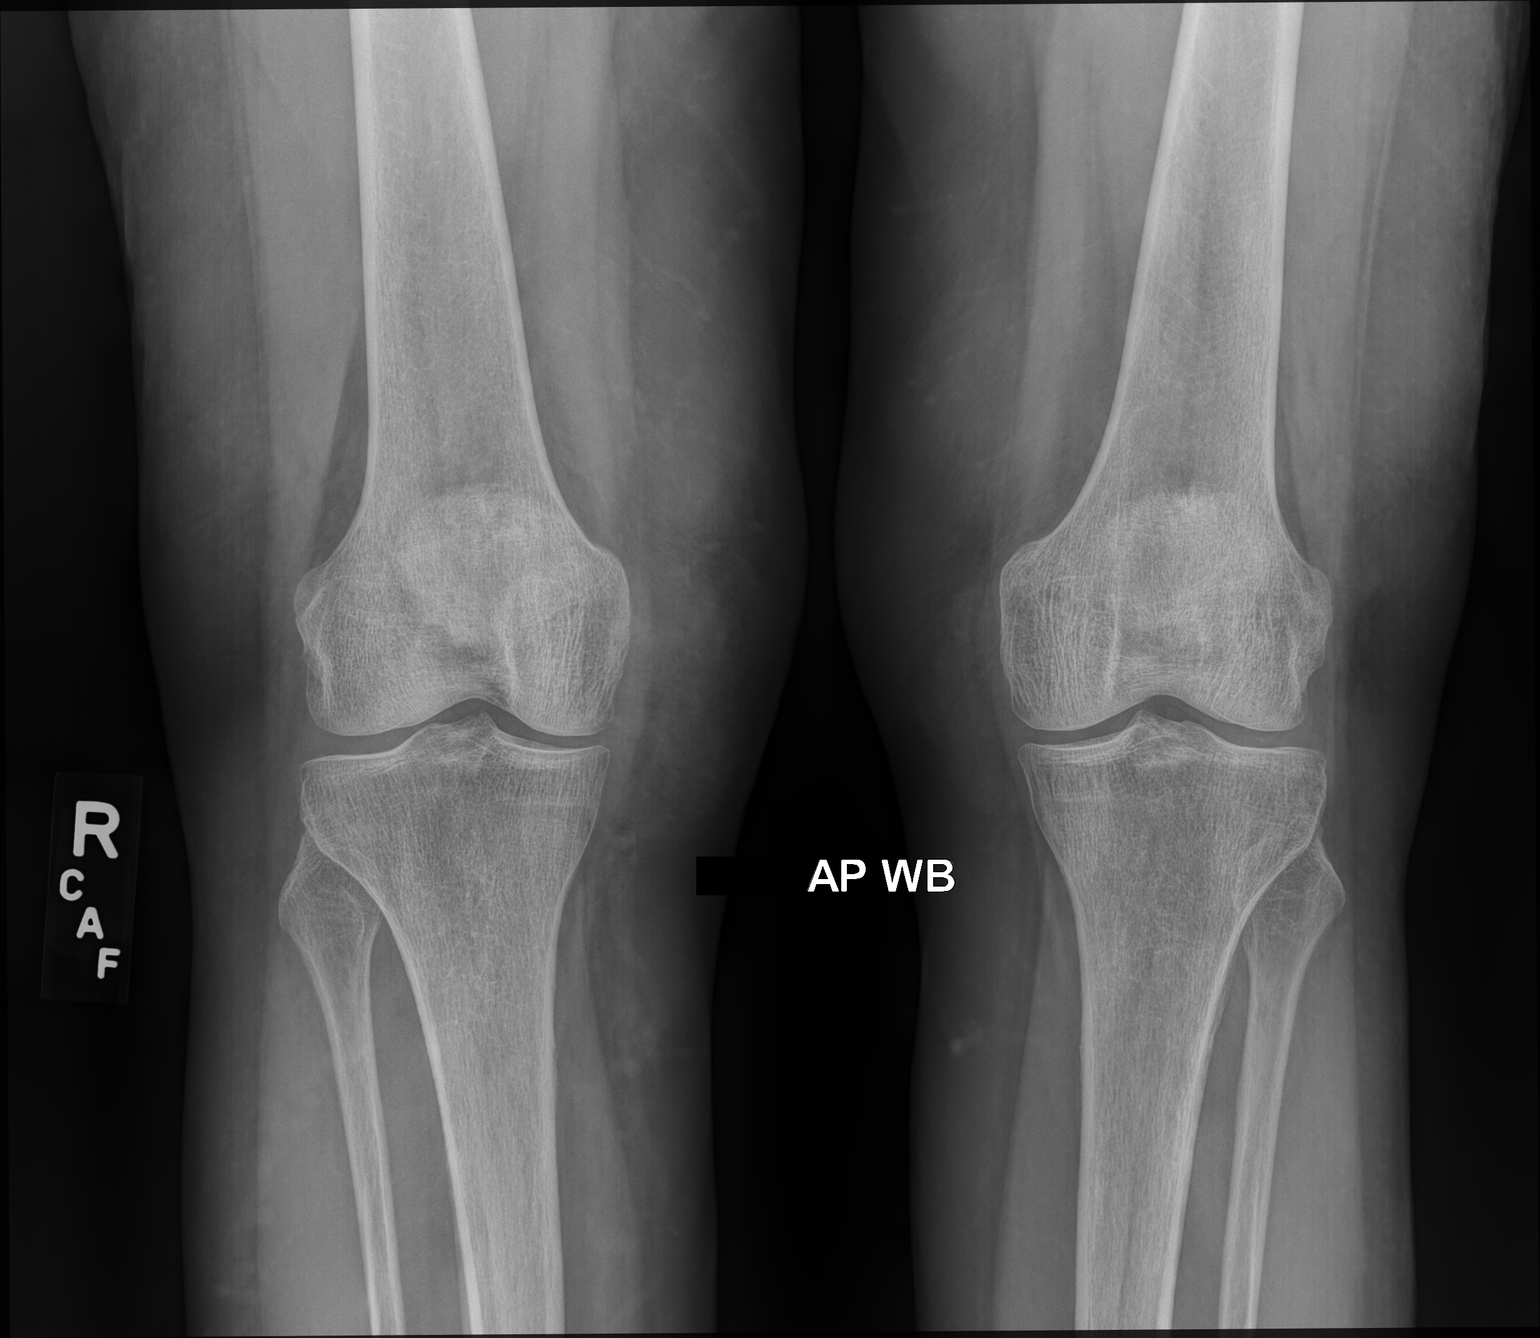

[knee standing lat]
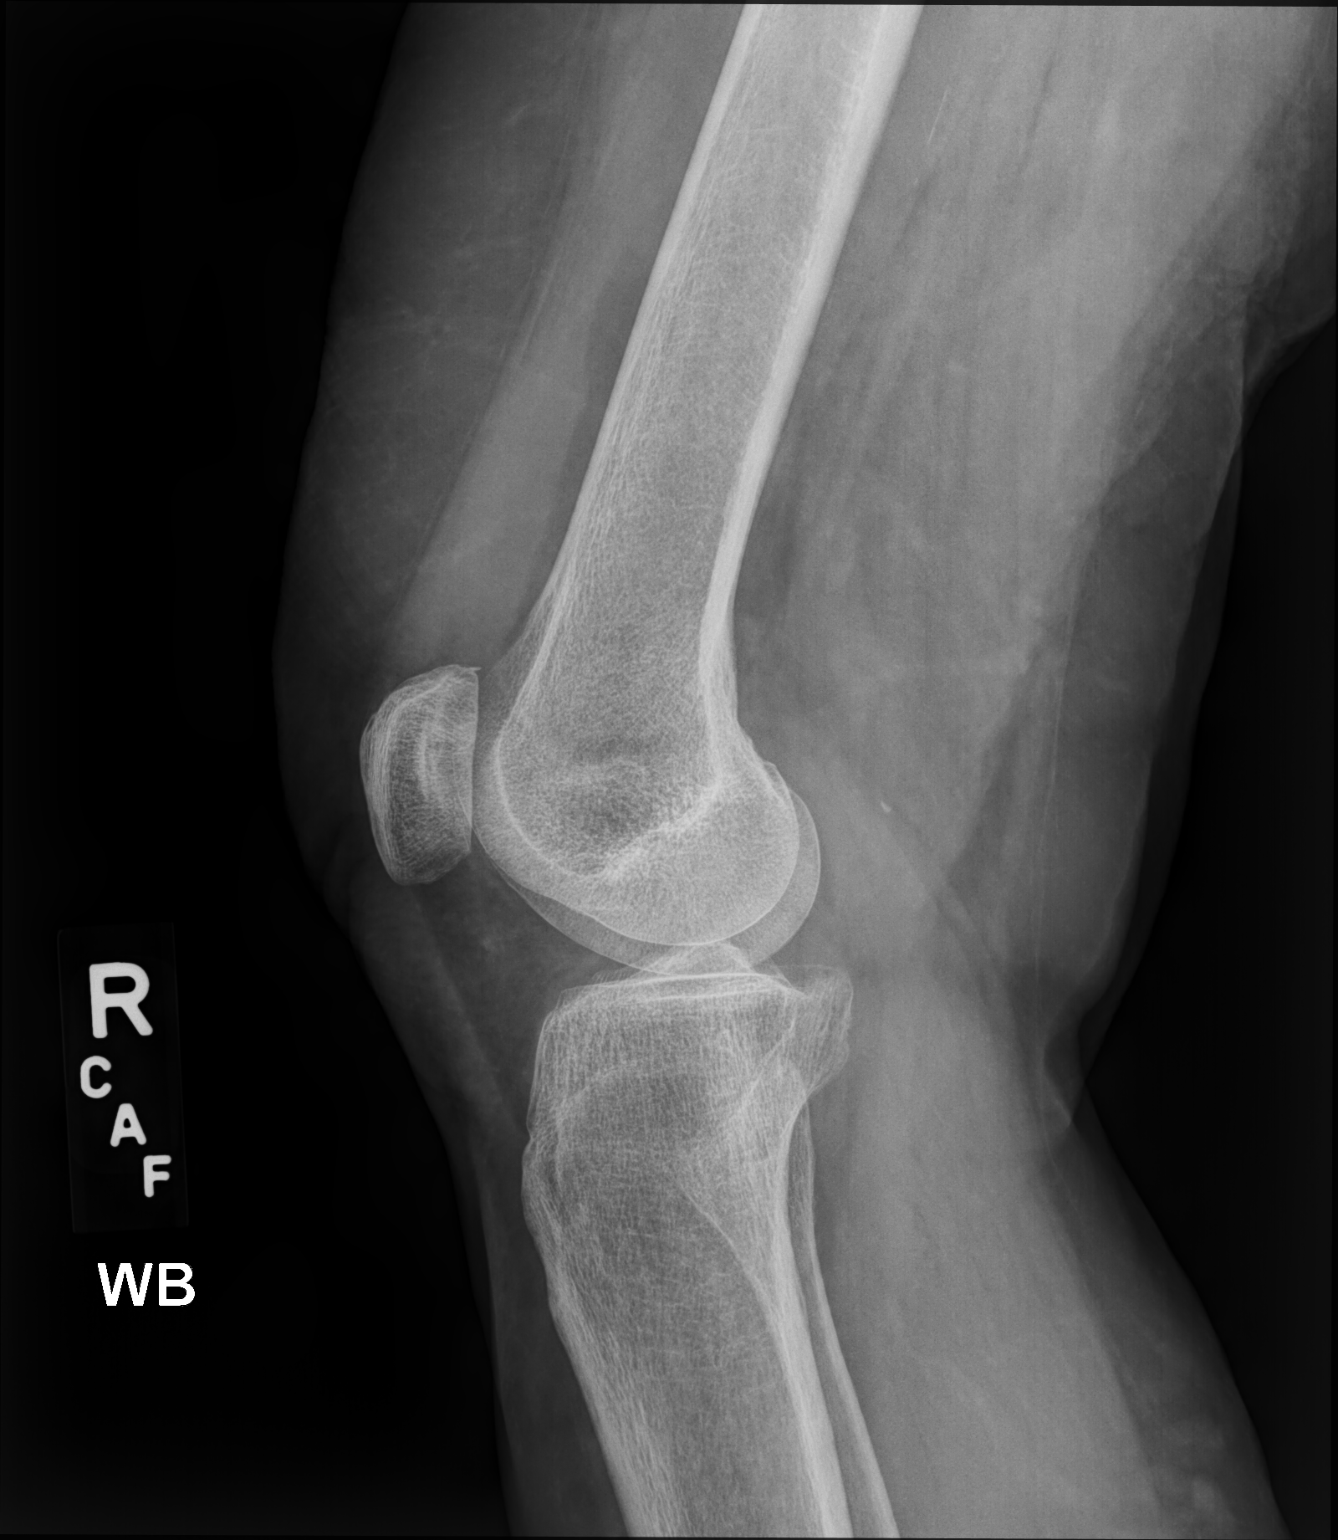

[2 of 2 positions shown; findings below may reference images not displayed]

FINDINGS: Small knee joint effusion. No acute bony or joint abnormality
identified. No evidence of fracture or dislocation.
IMPRESSION: Small knee joint effusion.  No acute bony abnormality.

## 2018-09-21 ENCOUNTER — Ambulatory Visit (INDEPENDENT_AMBULATORY_CARE_PROVIDER_SITE_OTHER): Payer: Medicare HMO

## 2018-09-21 ENCOUNTER — Encounter: Payer: Self-pay | Admitting: Family Medicine

## 2018-09-21 DIAGNOSIS — Z23 Encounter for immunization: Secondary | ICD-10-CM | POA: Diagnosis not present

## 2018-09-23 ENCOUNTER — Other Ambulatory Visit: Payer: Self-pay | Admitting: Family Medicine

## 2018-10-08 DIAGNOSIS — H2513 Age-related nuclear cataract, bilateral: Secondary | ICD-10-CM | POA: Diagnosis not present

## 2018-10-16 ENCOUNTER — Other Ambulatory Visit: Payer: Self-pay | Admitting: Family Medicine

## 2018-11-11 ENCOUNTER — Encounter: Payer: Self-pay | Admitting: Family Medicine

## 2018-11-11 ENCOUNTER — Ambulatory Visit (INDEPENDENT_AMBULATORY_CARE_PROVIDER_SITE_OTHER): Payer: Medicare HMO | Admitting: Family Medicine

## 2018-11-11 VITALS — BP 154/86 | HR 62 | Temp 98.0°F | Ht 65.0 in | Wt 147.4 lb

## 2018-11-11 DIAGNOSIS — I10 Essential (primary) hypertension: Secondary | ICD-10-CM

## 2018-11-11 DIAGNOSIS — R6889 Other general symptoms and signs: Secondary | ICD-10-CM

## 2018-11-11 DIAGNOSIS — E785 Hyperlipidemia, unspecified: Secondary | ICD-10-CM | POA: Diagnosis not present

## 2018-11-11 DIAGNOSIS — M810 Age-related osteoporosis without current pathological fracture: Secondary | ICD-10-CM

## 2018-11-11 DIAGNOSIS — K219 Gastro-esophageal reflux disease without esophagitis: Secondary | ICD-10-CM | POA: Diagnosis not present

## 2018-11-11 LAB — CBC
HEMATOCRIT: 40.5 % (ref 36.0–46.0)
HEMOGLOBIN: 13.5 g/dL (ref 12.0–15.0)
MCHC: 33.3 g/dL (ref 30.0–36.0)
MCV: 90.7 fl (ref 78.0–100.0)
PLATELETS: 230 10*3/uL (ref 150.0–400.0)
RBC: 4.47 Mil/uL (ref 3.87–5.11)
RDW: 14.4 % (ref 11.5–15.5)
WBC: 9.5 10*3/uL (ref 4.0–10.5)

## 2018-11-11 LAB — LDL CHOLESTEROL, DIRECT: Direct LDL: 57 mg/dL

## 2018-11-11 LAB — TSH: TSH: 3.8 u[IU]/mL (ref 0.35–4.50)

## 2018-11-11 MED ORDER — FAMOTIDINE 20 MG PO TABS: 20.0000 mg | ORAL_TABLET | Freq: Two times a day (BID) | ORAL | 5 refills | Status: DC | PRN

## 2018-11-11 MED ORDER — PREDNISONE 20 MG PO TABS: ORAL_TABLET | ORAL | 0 refills | Status: DC

## 2018-11-11 NOTE — Progress Notes (Signed)
Subjective:  Jennifer Gross is a 77 y.o. year old very pleasant female patient who presents for/with See problem oriented charting ROS- No chest pain or shortness of breath. No headache or blurry vision.  Does have some left lateral hip pain and some shoulder pain on the left  Past Medical History-  Patient Active Problem List   Diagnosis Date Noted  . CAD (coronary artery disease) 11/10/2009    Priority: High  . Osteoporosis     Priority: Medium  . Hyperlipidemia 03/29/2008    Priority: Medium  . Essential hypertension 03/29/2008    Priority: Medium  . History of basal cell carcinoma of skin     Priority: Low  . GERD 05/05/2008    Priority: Low  . Allergic rhinitis 04/08/2008    Priority: Low  . S/P laparoscopic assisted vaginal hysterectomy (LAVH) 07/20/2018  . Preop cardiovascular exam 06/29/2018  . Chronic pain of right knee 10/12/2017    Medications- reviewed and updated Current Outpatient Medications  Medication Sig Dispense Refill  . acetaminophen (TYLENOL) 500 MG tablet Take 1,000 mg by mouth 2 (two) times daily as needed for moderate pain or headache.    . ALENDRONATE SODIUM PO Take 150 mg by mouth every 30 (thirty) days.    Marland Kitchen aspirin 81 MG tablet Take 81 mg by mouth every evening.     Marland Kitchen atorvastatin (LIPITOR) 80 MG tablet TAKE 1 TABLET BY MOUTH ONCE DAILY OR AS DIRECTED 90 tablet 2  . carvedilol (COREG) 25 MG tablet Take 1 tablet (25 mg total) by mouth 2 (two) times daily. 180 tablet 3  . diclofenac sodium (VOLTAREN) 1 % GEL APPLY 2 GRAMS TOPICALLY FOUR TIMES DAILY 100 g 1  . hydrochlorothiazide (HYDRODIURIL) 25 MG tablet Take 1 tablet (25 mg total) by mouth daily. 90 tablet 3  . losartan (COZAAR) 100 MG tablet TAKE ONE TABLET BY MOUTH ONCE DAILY 90 tablet 2  . nitroGLYCERIN (NITROSTAT) 0.4 MG SL tablet Place 1 tablet (0.4 mg total) under the tongue every 5 (five) minutes as needed for chest pain. 25 tablet 2  . ezetimibe (ZETIA) 10 MG tablet Take 1 tablet (10 mg  total) by mouth daily. 90 tablet 3  . famotidine (PEPCID) 20 MG tablet Take 1 tablet (20 mg total) by mouth 2 (two) times daily as needed for heartburn or indigestion. 60 tablet 5  . predniSONE (DELTASONE) 20 MG tablet Take 1 tablet by mouth daily for 5 days, then 1/2 tablet daily for 2 days 6 tablet 0   No current facility-administered medications for this visit.     Objective: BP (!) 154/86   Pulse 62   Temp 98 F (36.7 C) (Oral)   Ht 5\' 5"  (1.651 m)   Wt 147 lb 6.4 oz (66.9 kg)   SpO2 94%   BMI 24.53 kg/m  Gen: NAD, resting comfortably CV: RRR no murmurs rubs or gallops Lungs: CTAB no crackles, wheeze, rhonchi Abdomen: soft/nontender/nondistended/normal bowel sounds. No rebound or guarding.  Ext: no edema Skin: warm, dry Neuro: Good strength in upper and lower extremities MSK-some pain over bicipital groove her left shoulder.  Good range of motion of the shoulder. Patient has pain over left greater trochanter with direct palpation.  No groin pain.  Assessment/Plan:  Other notes: 1.On alendronate through- Dr. 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 After hysterectomy in last June- when stopped the antibiotic after proedure  seemed to come back.  A/P: Bicipital tendinitis, left greater trochanter bursitis potentially on exam- as long as her blood pressure is controlled at home-I sent in a course of prednisone for her to use-using lower dose due to her age  Essential hypertension S: at home--> controlled. In office poorly controlled on hydrochlorothiazide 25 mg, losartan 100 mg, Coreg 25 mg twice daily--> she states she forgot to take her medication today.   Daughter in law has moved has moved in- daughter in law doesn't have support from her biological parents- makes her miss her son all the more [place].  BP Readings from Last 3 Encounters:  11/11/18 (!) 154/86  07/21/18 (!) 142/58  07/08/18  137/61  A/P: We discussed blood pressure goal of <140/90. Continue current meds:  Needs to take these as soon as she gets home. Can recheck blood pressure at visit in a month.      Hyperlipidemia S: Mild poorly controlled on atorvastatin 80 mg-last LDL 82-offered adding Zetia which she declined at that time- did start when cardiology suggested it Lab Results  Component Value Date   CHOL 151 05/11/2018   HDL 56.80 05/11/2018   LDLCALC 82 05/11/2018   TRIG 61.0 05/11/2018   CHOLHDL 3 05/11/2018   A/P: hopefully improve= update direct LDL today  GERD S: Had been using Zantac as needed for reflux.  Fortunately she is not having to use this frequently we discussed trial of Pepcid instead. A/P: Stable-change to as needed Pepcid instead of Zantac due to recall   Future Appointments  Date Time Provider [place]  12/24/2018  9:20 AM Marin Olp, MD [facility]   Lab/Order associations: Cold intolerance - Plan: CBC, TSH  Essential hypertension  Hyperlipidemia, unspecified hyperlipidemia type - Plan: LDL cholesterol, direct  Age-related osteoporosis without current pathological fracture  Gastroesophageal reflux disease without esophagitis  Meds ordered this encounter  Medications  . famotidine (PEPCID) 20 MG tablet    Sig: Take 1 tablet (20 mg total) by mouth 2 (two) times daily as needed for heartburn or indigestion.    Dispense:  60 tablet    Refill:  5  . predniSONE (DELTASONE) 20 MG tablet    Sig: Take 1 tablet by mouth daily for 5 days, then 1/2 tablet daily for 2 days    Dispense:  6 tablet    Refill:  0   Return precautions advised.  Garret Reddish, MD

## 2018-11-11 NOTE — Patient Instructions (Addendum)
Blood pressure- Needs to take these as soon as she gets home. Can recheck blood pressure at visit in 1-3 weeks.  Depending on what we see next visit may have to adjust/increase medications.   Trial prednisone for your left hip and left shoulder.  If you do not improve see Dr. Paulla Fore back.  If your blood pressures go over 145 on the prednisone go ahead and stop and see me back  Please stop by lab before you go

## 2018-11-11 NOTE — Assessment & Plan Note (Signed)
S: Had been using Zantac as needed for reflux.  Fortunately she is not having to use this frequently we discussed trial of Pepcid instead. A/P: Stable-change to as needed Pepcid instead of Zantac due to recall

## 2018-11-11 NOTE — Assessment & Plan Note (Signed)
S: Mild poorly controlled on atorvastatin 80 mg-last LDL 82-offered adding Zetia which she declined at that time- did start when cardiology suggested it Lab Results  Component Value Date   CHOL 151 05/11/2018   HDL 56.80 05/11/2018   LDLCALC 82 05/11/2018   TRIG 61.0 05/11/2018   CHOLHDL 3 05/11/2018   A/P: hopefully improve= update direct LDL today

## 2018-11-11 NOTE — Assessment & Plan Note (Signed)
S: at home--> controlled. In office poorly controlled on hydrochlorothiazide 25 mg, losartan 100 mg, Coreg 25 mg twice daily--> she states she forgot to take her medication today.   Daughter in law has moved has moved in- daughter in law doesn't have support from her biological parents- makes her miss her son all the more [place].  BP Readings from Last 3 Encounters:  11/11/18 (!) 154/86  07/21/18 (!) 142/58  07/08/18 137/61  A/P: We discussed blood pressure goal of <140/90. Continue current meds:  Needs to take these as soon as she gets home. Can recheck blood pressure at visit in a month.

## 2018-12-18 DIAGNOSIS — L821 Other seborrheic keratosis: Secondary | ICD-10-CM | POA: Diagnosis not present

## 2018-12-18 DIAGNOSIS — Z85828 Personal history of other malignant neoplasm of skin: Secondary | ICD-10-CM | POA: Diagnosis not present

## 2018-12-18 DIAGNOSIS — L570 Actinic keratosis: Secondary | ICD-10-CM | POA: Diagnosis not present

## 2018-12-18 DIAGNOSIS — D225 Melanocytic nevi of trunk: Secondary | ICD-10-CM | POA: Diagnosis not present

## 2018-12-18 DIAGNOSIS — D1801 Hemangioma of skin and subcutaneous tissue: Secondary | ICD-10-CM | POA: Diagnosis not present

## 2018-12-24 ENCOUNTER — Encounter: Payer: Self-pay | Admitting: Family Medicine

## 2018-12-24 ENCOUNTER — Ambulatory Visit (INDEPENDENT_AMBULATORY_CARE_PROVIDER_SITE_OTHER): Payer: Medicare HMO | Admitting: Family Medicine

## 2018-12-24 VITALS — BP 141/83 | HR 61 | Temp 98.3°F | Ht 65.0 in | Wt 149.8 lb

## 2018-12-24 DIAGNOSIS — I10 Essential (primary) hypertension: Secondary | ICD-10-CM

## 2018-12-24 DIAGNOSIS — M25552 Pain in left hip: Secondary | ICD-10-CM | POA: Diagnosis not present

## 2018-12-24 DIAGNOSIS — M25512 Pain in left shoulder: Secondary | ICD-10-CM

## 2018-12-24 NOTE — Assessment & Plan Note (Signed)
S: Last visit patient stated her blood pressure was controlled at home but it was poorly controlled on office visit with hydrochlorothiazide 12.5 mg (was having potassium issues on full tablet) , losartan 100 mg, Coreg 25 mg twice daily-though she had forgotten to take her medication that day.    Most of the time seeing under 130. Did have a 1x measurement of 153 and also rarely into 140s  Patient complained of additional stress last visit with daughter-in-law moving in.  Particularly stressful as it reminds her of her son Elta [place] and losing him. BP Readings from Last 3 Encounters:  12/24/18 (!) 154/78--> 141/83  11/11/18 (!) 154/86  07/21/18 (!) 142/58  A/P: We discussed blood pressure goal of <140/90 ideally though over age 4 technically can tolerate up to 263 systolic. Has really good home control for most part with systolic under 335. Likely has white coat hypertension element.

## 2018-12-24 NOTE — Patient Instructions (Addendum)
No changes today- blood pressure sounds like its doing well at home  Please schedule a visit with our  sports medicine physician Dr. Paulla Fore before you leave at the check out desk so he can further evaluate your left hip and left shoulder

## 2018-12-24 NOTE — Progress Notes (Signed)
Subjective:  Jennifer Gross is a 78 y.o. year old very pleasant female patient who presents for/with See problem oriented charting ROS- No chest pain or shortness of breath. No headache or blurry vision.   Past Medical History-  Patient Active Problem List   Diagnosis Date Noted  . CAD (coronary artery disease) 11/10/2009    Priority: High  . Osteoporosis     Priority: Medium  . Hyperlipidemia 03/29/2008    Priority: Medium  . Essential hypertension 03/29/2008    Priority: Medium  . History of basal cell carcinoma of skin     Priority: Low  . GERD 05/05/2008    Priority: Low  . Allergic rhinitis 04/08/2008    Priority: Low  . S/P laparoscopic assisted vaginal hysterectomy (LAVH) 07/20/2018  . Preop cardiovascular exam 06/29/2018  . Chronic pain of right knee 10/12/2017    Medications- reviewed and updated Current Outpatient Medications  Medication Sig Dispense Refill  . acetaminophen (TYLENOL) 500 MG tablet Take 1,000 mg by mouth 2 (two) times daily as needed for moderate pain or headache.    . ALENDRONATE SODIUM PO Take 150 mg by mouth every 30 (thirty) days.    Marland Kitchen aspirin 81 MG tablet Take 81 mg by mouth every evening.     Marland Kitchen atorvastatin (LIPITOR) 80 MG tablet TAKE 1 TABLET BY MOUTH ONCE DAILY OR AS DIRECTED 90 tablet 2  . carvedilol (COREG) 25 MG tablet Take 1 tablet (25 mg total) by mouth 2 (two) times daily. 180 tablet 3  . diclofenac sodium (VOLTAREN) 1 % GEL APPLY 2 GRAMS TOPICALLY FOUR TIMES DAILY 100 g 1  . famotidine (PEPCID) 20 MG tablet Take 1 tablet (20 mg total) by mouth 2 (two) times daily as needed for heartburn or indigestion. 60 tablet 5  . hydrochlorothiazide (HYDRODIURIL) 25 MG tablet Take 1 tablet (25 mg total) by mouth daily. 90 tablet 3  . losartan (COZAAR) 100 MG tablet TAKE ONE TABLET BY MOUTH ONCE DAILY 90 tablet 2  . nitroGLYCERIN (NITROSTAT) 0.4 MG SL tablet Place 1 tablet (0.4 mg total) under the tongue every 5 (five) minutes as needed for chest  pain. 25 tablet 2  . ezetimibe (ZETIA) 10 MG tablet Take 1 tablet (10 mg total) by mouth daily. 90 tablet 3   No current facility-administered medications for this visit.     Objective: BP (!) 141/83 on repeat (BP Location: Left Arm, Patient Position: Sitting, Cuff Size: Large)   Pulse 61   Temp 98.3 F (36.8 C) (Oral)   Ht 5\' 5"  (1.651 m)   Wt 149 lb 12.8 oz (67.9 kg)   SpO2 95%   BMI 24.93 kg/m  Gen: NAD, resting comfortably CV: RRR no murmurs rubs or gallops Lungs: CTAB no crackles, wheeze, rhonchi Ext: no edema Skin: warm, dry Neuro: Speech normal, moves all extremities  We did not reexamine her hip or shoulder today you update the blood  Assessment/Plan:  Acute pain of left shoulder - Plan: Ambulatory referral to Sports Medicine Left hip pain - Plan: Ambulatory referral to Sports Medicine S: Patient was dealing with some left shoulder and hip pain last visit-thought to be bicipital tendinitis and left greater trochanter bursitis.  We trialed a course of prednisone and she states it was helpful but now her pain is worsening again.  A/P: Had some reasonable improvement on prednisone but worsening again- wants to see sports medicine again- will place new referral as last one placed 09/2017.   Essential hypertension S:  Last visit patient stated her blood pressure was controlled at home but it was poorly controlled on office visit with hydrochlorothiazide 12.5 mg (was having potassium issues on full tablet) , losartan 100 mg, Coreg 25 mg twice daily-though she had forgotten to take her medication that day.    Most of the time seeing under 130. Did have a 1x measurement of 153 and also rarely into 140s  Patient complained of additional stress last visit with daughter-in-law moving in.  Particularly stressful as it reminds her of her son Elta [place] and losing him. BP Readings from Last 3 Encounters:  12/24/18 (!) 154/78--> 141/83  11/11/18 (!) 154/86  07/21/18 (!) 142/58  A/P: We  discussed blood pressure goal of <140/90 ideally though over age 16 technically can tolerate up to 856 systolic. Has really good home control for most part with systolic under 314. Likely has white coat hypertension element.    Patient plans to schedule may follow-up  Lab/Order associations: Essential hypertension  Acute pain of left shoulder - Plan: Ambulatory referral to Sports Medicine  Left hip pain - Plan: Ambulatory referral to Sports Medicine  Return precautions advised.  Garret Reddish, MD

## 2018-12-24 NOTE — Addendum Note (Signed)
Addended by: Marin Olp on: 12/24/2018 09:44 AM   Modules accepted: Level of Service

## 2018-12-30 ENCOUNTER — Ambulatory Visit (INDEPENDENT_AMBULATORY_CARE_PROVIDER_SITE_OTHER): Payer: Medicare HMO

## 2018-12-30 ENCOUNTER — Ambulatory Visit: Payer: Medicare HMO | Admitting: Sports Medicine

## 2018-12-30 ENCOUNTER — Ambulatory Visit: Payer: Self-pay

## 2018-12-30 ENCOUNTER — Encounter: Payer: Self-pay | Admitting: Sports Medicine

## 2018-12-30 VITALS — BP 160/80 | HR 60 | Ht 65.0 in | Wt 152.0 lb

## 2018-12-30 DIAGNOSIS — M25559 Pain in unspecified hip: Secondary | ICD-10-CM

## 2018-12-30 DIAGNOSIS — M25512 Pain in left shoulder: Secondary | ICD-10-CM

## 2018-12-30 DIAGNOSIS — M25552 Pain in left hip: Secondary | ICD-10-CM

## 2018-12-30 DIAGNOSIS — R29898 Other symptoms and signs involving the musculoskeletal system: Secondary | ICD-10-CM

## 2018-12-30 DIAGNOSIS — M75102 Unspecified rotator cuff tear or rupture of left shoulder, not specified as traumatic: Secondary | ICD-10-CM

## 2018-12-30 DIAGNOSIS — M19012 Primary osteoarthritis, left shoulder: Secondary | ICD-10-CM | POA: Diagnosis not present

## 2018-12-30 DIAGNOSIS — G2589 Other specified extrapyramidal and movement disorders: Secondary | ICD-10-CM

## 2018-12-30 DIAGNOSIS — G8929 Other chronic pain: Secondary | ICD-10-CM | POA: Diagnosis not present

## 2018-12-30 DIAGNOSIS — M1611 Unilateral primary osteoarthritis, right hip: Secondary | ICD-10-CM | POA: Diagnosis not present

## 2018-12-30 DIAGNOSIS — M1612 Unilateral primary osteoarthritis, left hip: Secondary | ICD-10-CM | POA: Diagnosis not present

## 2018-12-30 MED ORDER — NITROGLYCERIN 0.2 MG/HR TD PT24: MEDICATED_PATCH | TRANSDERMAL | 1 refills | Status: DC

## 2018-12-30 NOTE — Patient Instructions (Addendum)
Nitroglycerin Protocol   Apply 1/4 nitroglycerin patch to affected area daily.  Change position of patch within the affected area every 24 hours.  You may experience a headache during the first 1-2 weeks of using the patch, these should subside.  If you experience headaches after beginning nitroglycerin patch treatment, you may take your preferred over the counter pain reliever.  Another side effect of the nitroglycerin patch is skin irritation or rash related to patch adhesive.  Please notify our office if you develop more severe headaches or rash, and stop the patch.  Tendon healing with nitroglycerin patch may require 12 to 24 weeks depending on the extent of injury.  Men should not use if taking Viagra, Cialis, or Levitra.   Do not use if you have migraines or rosacea.   Please perform the exercise program that we have prepared for you and gone over in detail on a daily basis.  In addition to the handout you were provided you can access your program through: [email or web address]   Your unique program code is: [email or web address]

## 2018-12-30 NOTE — Progress Notes (Signed)
Jennifer Gross. Jennifer Gross, [place] at [facility] [phone]  Jennifer Gross - 78 y.o. female MRN [id number]  Date of birth: 1941/07/01  Visit Date:   PCP: Marin Olp, MD   Referred by: Marin Olp, MD   SUBJECTIVE:  Chief Complaint  Patient presents with  . L shoulder pain  . L hip pain    HPI: Left shoulder: She has had pain for several years previously seen by Dr. Yong Channel and initially had spontaneous improvement with medication and conservative measures.  Over the past 6 months however she has had continued worsening.  Pain is worse with overhead reaching.  She has not had any loss of range of motion.  She does not have any mechanical symptoms.  Occasionally gets a burning and sharp pain localizing to the anterior lateral shoulder.  Left hip: Pain is also been present for several years and worsened over the past 6 months.  Pain is worse with weightbearing and walking.  Going from sit to stand causes pain to localize to the left gluteal musculature.  She has no significant nighttime pain with this but does notice some discomfort if she rolls over on top of it.  The pain does not radiate.  She denies any numbness or tingling with this as well.  No low back pain.  Pain is sharp and rated at 8 out of 10 when it is irritated but has improved since she has been taking prednisone as well as the shoulder which has improved on the prednisone.  REVIEW OF SYSTEMS: Denies night time disturbances. Denies fevers, chills, or night sweats. Denies unexplained weight loss. Reports skin cancer personal history of cancer. Denies changes in bowel or bladder habits. Denies recent unreported falls. Denies new or worsening dyspnea or wheezing. Denies headaches or dizziness.  Denies numbness, tingling or weakness  In the extremities.  Denies dizziness or presyncopal episodes Denies lower extremity edema   HISTORY:  Prior history reviewed and updated  per electronic medical record.  Social History   Occupational History  . Occupation: retired  Tobacco Use  . Smoking status: Never Smoker  . Smokeless tobacco: Never Used  Substance and Sexual Activity  . Alcohol use: No  . Drug use: No  . Sexual activity: Not on file   Social History   Social History Narrative   Married 1970. 2 living children. 4 children (lost one at age 66 drug related to heart attack and one at 54 to heart attack not drug related), 6 grandkids. 2 greatgrandkids with 1 on the way March 2016.       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 ALLERGIC RHINITIS 04/08/2008  . Allergy   . Basal cell carcinoma    back  . CAD (coronary artery disease) 2008   status post anterior myocardial infarction, treated w/ a dru-eluting stent to the LAD  . CAD, NATIVE VESSEL 11/10/2009  . GERD (gastroesophageal reflux disease)   . Hiatal hernia   . HYPERLIPIDEMIA 03/29/2008  . HYPERTENSION 03/29/2008  . Myocardial infarction ([place])    2008  . Osteopenia      Past Surgical History:  Procedure Laterality Date  . ANTERIOR AND POSTERIOR REPAIR WITH SACROSPINOUS FIXATION N/A 07/20/2018   Procedure: ANTERIOR AND POSTERIOR REPAIR WITH SACROSPINOUS FIXATION;  Surgeon: Arvella Nigh, MD;  Location: [facility];  Service: Gynecology;  Laterality: N/A;  . APPENDECTOMY    . BASAL CELL CARCINOMA EXCISION     back  . BLADDER SUSPENSION N/A 07/20/2018   Procedure: TRANSOBTURATOR Margarita Grizzle;  Surgeon: Arvella Nigh, MD;  Location: [facility];  Service: Gynecology;  Laterality: N/A;  . LAPAROSCOPIC VAGINAL HYSTERECTOMY WITH SALPINGO OOPHORECTOMY Bilateral 07/20/2018   Procedure: LAPAROSCOPIC ASSISTED VAGINAL HYSTERECTOMY WITH SALPINGO OOPHORECTOMY;  Surgeon: Arvella Nigh, MD;  Location: [facility];  Service: Gynecology;  Laterality: Bilateral;  . Percutaneous transluminal coronary angioplasty and drug-eluting  09/2007     Stent placement in the left anterior descending  . WISDOM TOOTH EXTRACTION      family history includes CAD in her son; Coronary artery disease in her maternal uncle and mother; Heart disease in her brother; Lung cancer in her father. There is no history of Colon cancer, Esophageal cancer, Rectal cancer, or Stomach cancer.  DATA OBTAINED & REVIEWED:  Recent Labs    05/11/18 1004 07/08/18 1135 11/11/18 1051  CALCIUM 9.6 9.0  --   AST 14 26  --   ALT 12 23  --   TSH  --   --  3.80   No problems updated. No specialty comments available.  OBJECTIVE:  VS:  HT:5\' 5"  (165.1 cm)   WT:152 lb (68.9 kg)  BMI:25.29    BP: (!) 160/80  HR:60bpm  TEMP: ( )  RESP:98 %   PHYSICAL EXAM: CONSTITUTIONAL: Well-developed, Well-nourished and In no acute distress PSYCHIATRIC : Alert & appropriately interactive. and Not depressed or anxious appearing. RESPIRATORY : No increased work of breathing and Trachea Midline EYES : Pupils are equal., EOM intact without nystagmus. and No scleral icterus.  VASCULAR EXAM : Warm and well perfused NEURO: unremarkable  MSK Exam:  Right shoulder  Well aligned No significant deformity. No overlying skin changes TTP over: Anterior shoulder but this is minimal.  No focal bony tenderness. No swelling No effusion No synovitis She has most focal in moderate amount of pain with empty can testing on the left.  She has 4-5 weakness with this as well as well as scapular dyskinesis.  Otherwise internal rotation, external rotation, speeds testing and O'Brien's testing strength and pain are normal.  She has good overhead range of motion.  Negative Hawkins with a slightly positive Neer's.  Left hip: Moderate tenderness over the gluteal musculature.  She has a 4 out of 5 hip abductor weakness with TFL predominant recruitment pattern and poor glue medius activation.  She has normal and pain-free internal and external range of motion of the left hip.  Negative  Stinchfield test localizing to the hip but does have some pain in the distal quad  X-rays of the left shoulder and hip reviewed with her today that are remarkable for calcific changes of the tendon insertions and overall well-maintained joint spaces with only minimal osteoarthritic change of the glenohumeral and femoral acetabular joints.  Moderate AC joint arthropathy.  Loss of subacromial space suggestive possible underlying rotator cuff insufficiency.    ASSESSMENT  1. Left hip pain   2. Chronic left shoulder pain   3. Rotator cuff syndrome of left shoulder   4. Scapular dyskinesis   5. Greater trochanteric pain syndrome   6. Weakness of left hip     PLAN:  Pertinent additional documentation may be included in corresponding procedure notes, imaging studies, problem based documentation and patient instructions.  Procedures:  Home Therapeutic exercises prescribed per procedure note.  Medications:   Meds ordered this  encounter  Medications  . nitroGLYCERIN (NITRODUR - DOSED IN MG/24 HR) 0.2 mg/hr patch    Sig: Place 1/4 to 1/2 of a patch over affected region. Remove and replace once daily.  Slightly alter skin placement daily    Dispense:  30 patch    Refill:  1    For musculoskeletal purposes.  Okay to cut patch.     Discussion/Instructions: No problem-specific Assessment & Plan notes found for this encounter. THERAPEUTIC EXERCISE: Discussed the foundation of treatment for this condition is physical therapy and/or daily (5-6 days/week) therapeutic exercises, focusing on core strengthening, coordination, neuromuscular control/reeducation.  Home Therapeutic exercises prescribed today per procedure note. TENDINOPATHY - Discussed that the anticipated amount of time for healing is 12- 24 weeks for Tendinopathic changes.  Emphasized the importance of improving blood flow as well as eccentric loading of the tendon. NITRO PROTOCOL - Discussed options with the patient today including  biologic treatment with topical nitroglycerin. Patient has no contraindications & understands the risks, benefits and intentions of treatment. Emphasized the importance of rotating sites as well as appropriate and expected adverse reactions including orthostasis, headache, adhesive sensitivity.  Discussed red flag symptoms that warrant earlier emergent evaluation and patient voices understanding. Activity modifications and the importance of avoiding exacerbating activities (limiting pain to no more than a 4 / 10 during or following activity) recommended and discussed.   Functional pain with underlying tendinopathy changes based on clinical exam and history.   At follow up will plan : to consider initial corticosteroid injections Return in about 6 weeks (around 02/10/2019).          Gerda Diss, [facility] Physician

## 2018-12-30 NOTE — Progress Notes (Signed)
PROCEDURE NOTE: THERAPEUTIC EXERCISES (97110) 15 minutes spent for Therapeutic exercises as below and as referenced in the AVS.  This included exercises focusing on stretching, strengthening, with significant focus on eccentric aspects.   Proper technique shown and discussed handout in great detail with ATC.  All questions were discussed and answered.   Long term goals include an improvement in range of motion, strength, endurance as well as avoiding reinjury. Frequency of visits is one time as determined during today's  office visit. Frequency of exercises to be performed is as per handout.  EXERCISES REVIEWED: Intrinsic Rotator Cuff Exercises Scapular Stabilization Hip ABduction strengthening with focus on [place] [employer]

## 2019-01-20 ENCOUNTER — Ambulatory Visit (INDEPENDENT_AMBULATORY_CARE_PROVIDER_SITE_OTHER): Payer: Medicare HMO | Admitting: Cardiovascular Disease

## 2019-01-20 ENCOUNTER — Encounter: Payer: Self-pay | Admitting: Cardiovascular Disease

## 2019-01-20 VITALS — BP 138/88 | HR 57 | Ht 65.0 in | Wt 152.4 lb

## 2019-01-20 DIAGNOSIS — I2510 Atherosclerotic heart disease of native coronary artery without angina pectoris: Secondary | ICD-10-CM

## 2019-01-20 DIAGNOSIS — E782 Mixed hyperlipidemia: Secondary | ICD-10-CM | POA: Diagnosis not present

## 2019-01-20 DIAGNOSIS — I10 Essential (primary) hypertension: Secondary | ICD-10-CM | POA: Diagnosis not present

## 2019-01-20 NOTE — Patient Instructions (Signed)
Medication Instructions:  Your provider recommends that you continue on your current medications as directed. Please refer to the Current Medication list given to you today.    Labwork: None  Testing/Procedures: None  Follow-Up: Your provider wants you to follow-up in: 1 year with Dr. Cooper. You will receive a reminder letter in the mail two months in advance. If you don't receive a letter, please call our office to schedule the follow-up appointment.    

## 2019-01-20 NOTE — Progress Notes (Signed)
Cardiology Office Note:    Date:  01/20/2019   ID:  Jennifer Gross, DOB 10/19/1941, MRN [id number]  PCP:  Marin Olp, MD  Cardiologist:  Sherren Mocha, MD  Electrophysiologist:  None   Referring MD: Marin Olp, MD   Chief Complaint  Patient presents with  . Coronary Artery Disease   History of Present Illness:    Jennifer Gross is a 78 y.o. female with a hx of coronary artery disease status post remote anterior wall MI in 2008.  She was treated with overlapping drug-eluting stents in the LAD and balloon angioplasty of the first diagonal branch.  The patient is here alone today.  She is doing well from a cardiac perspective.  Her medications are unchanged.  She specifically denies chest pain, chest pressure, dyspnea, edema, orthopnea, or PND.  She has some low blood pressure readings after taking her morning medications and feels generally fatigued when this occurs.  Home blood pressure runs in a good range based on her reports.  Past Medical History:  Diagnosis Date  . ALLERGIC RHINITIS 04/08/2008  . Allergy   . Basal cell carcinoma    back  . CAD (coronary artery disease) 2008   status post anterior myocardial infarction, treated w/ a dru-eluting stent to the LAD  . CAD, NATIVE VESSEL 11/10/2009  . GERD (gastroesophageal reflux disease)   . Hiatal hernia   . HYPERLIPIDEMIA 03/29/2008  . HYPERTENSION 03/29/2008  . Myocardial infarction ([place])    2008  . Osteopenia     Past Surgical History:  Procedure Laterality Date  . ANTERIOR AND POSTERIOR REPAIR WITH SACROSPINOUS FIXATION N/A 07/20/2018   Procedure: ANTERIOR AND POSTERIOR REPAIR WITH SACROSPINOUS FIXATION;  Surgeon: Arvella Nigh, MD;  Location: [facility];  Service: Gynecology;  Laterality: N/A;  . APPENDECTOMY    . BASAL CELL CARCINOMA EXCISION     back  . BLADDER SUSPENSION N/A 07/20/2018   Procedure: TRANSOBTURATOR Margarita Grizzle;  Surgeon: Arvella Nigh, MD;  Location: [facility];  Service: Gynecology;  Laterality:  N/A;  . LAPAROSCOPIC VAGINAL HYSTERECTOMY WITH SALPINGO OOPHORECTOMY Bilateral 07/20/2018   Procedure: LAPAROSCOPIC ASSISTED VAGINAL HYSTERECTOMY WITH SALPINGO OOPHORECTOMY;  Surgeon: Arvella Nigh, MD;  Location: [facility];  Service: Gynecology;  Laterality: Bilateral;  . Percutaneous transluminal coronary angioplasty and drug-eluting  09/2007   Stent placement in the left anterior descending  . WISDOM TOOTH EXTRACTION      Current Medications: Current Meds  Medication Sig  . acetaminophen (TYLENOL) 500 MG tablet Take 1,000 mg by mouth 2 (two) times daily as needed for moderate pain or headache.  . ALENDRONATE SODIUM PO Take 150 mg by mouth every 30 (thirty) days.  Marland Kitchen aspirin 81 MG tablet Take 81 mg by mouth every evening.   Marland Kitchen atorvastatin (LIPITOR) 80 MG tablet TAKE 1 TABLET BY MOUTH ONCE DAILY OR AS DIRECTED  . carvedilol (COREG) 25 MG tablet Take 1 tablet (25 mg total) by mouth 2 (two) times daily.  . diclofenac sodium (VOLTAREN) 1 % GEL Apply 2 g topically as needed.  . ezetimibe (ZETIA) 10 MG tablet Take 1 tablet (10 mg total) by mouth daily.  . famotidine (PEPCID) 20 MG tablet Take 1 tablet (20 mg total) by mouth 2 (two) times daily as needed for heartburn or indigestion.  . hydrochlorothiazide (HYDRODIURIL) 25 MG tablet Take 1 tablet (25 mg total) by mouth daily.  Marland Kitchen losartan (COZAAR) 100 MG tablet TAKE ONE TABLET BY MOUTH ONCE DAILY  . nitroGLYCERIN (NITRODUR -  DOSED IN MG/24 HR) 0.2 mg/hr patch Place 1/4 to 1/2 of a patch over affected region. Remove and replace once daily.  Slightly alter skin placement daily  . nitroGLYCERIN (NITROSTAT) 0.4 MG SL tablet Place 1 tablet (0.4 mg total) under the tongue every 5 (five) minutes as needed for chest pain.     Allergies:   Penicillins   Social History   Socioeconomic History  . Marital status: Married    Spouse name: Not on file  . Number of children: 4  . Years of education: Not on file  . Highest education level: Not on file    Occupational History  . Occupation: retired  Scientific laboratory technician  . Financial resource strain: Not on file  . Food insecurity:    Worry: Not on file    Inability: Not on file  . Transportation needs:    Medical: Not on file    Non-medical: Not on file  Tobacco Use  . Smoking status: Never Smoker  . Smokeless tobacco: Never Used  Substance and Sexual Activity  . Alcohol use: No  . Drug use: No  . Sexual activity: Not on file  Lifestyle  . Physical activity:    Days per week: Not on file    Minutes per session: Not on file  . Stress: Not on file  Relationships  . Social connections:    Talks on phone: Not on file    Gets together: Not on file    Attends religious service: Not on file    Active member of club or organization: Not on file    Attends meetings of clubs or organizations: Not on file    Relationship status: Not on file  Other Topics Concern  . Not on file  Social History Narrative   Married 1970. 2 living children. 4 children (lost one at age 96 drug related to heart attack and one at 7 to heart attack not drug related), 6 grandkids. 2 greatgrandkids with 1 on the way March 2016.       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 There is no history of Colon cancer, Esophageal cancer, Rectal cancer, or Stomach cancer.  ROS:   Please see the history of present illness.    All other systems reviewed and are negative.  EKGs/Labs/Other Studies Reviewed:    EKG:  EKG is not ordered today.    Recent Labs: 07/08/2018: ALT 23; BUN 18; Creatinine, Ser 0.81; Potassium 4.0; Sodium 137 11/11/2018: Hemoglobin 13.5; Platelets 230.0; TSH 3.80  Recent Lipid Panel    Component Value Date/Time   CHOL 151 05/11/2018 1004   TRIG 61.0 05/11/2018 1004    HDL 56.80 05/11/2018 1004   CHOLHDL 3 05/11/2018 1004   VLDL 12.2 05/11/2018 1004   LDLCALC 82 05/11/2018 1004   LDLDIRECT 57.0 11/11/2018 1051    Physical Exam:    VS:  BP 138/88   Pulse (!) 57   Ht 5\' 5"  (1.651 m)   Wt 152 lb 6.4 oz (69.1 kg)   SpO2 98%   BMI 25.36 kg/m     Wt Readings from Last 3 Encounters:  01/20/19 152 lb 6.4 oz (69.1 kg)  12/30/18 152 lb (68.9 kg)  12/24/18 149 lb 12.8 oz (  67.9 kg)     GEN:  Well nourished, well developed in no acute distress HEENT: Normal NECK: No JVD; No carotid bruits LYMPHATICS: No lymphadenopathy CARDIAC: RRR, no murmurs, rubs, gallops RESPIRATORY:  Clear to auscultation without rales, wheezing or rhonchi  ABDOMEN: Soft, non-tender, non-distended MUSCULOSKELETAL:  No edema; No deformity  SKIN: Warm and dry NEUROLOGIC:  Alert and oriented x 3 PSYCHIATRIC:  Normal affect   ASSESSMENT:    1. Coronary artery disease involving native coronary artery of native heart without angina pectoris   2. Essential hypertension   3. Mixed hyperlipidemia    PLAN:    In order of problems listed above:  1. The patient continues to do well without symptoms of angina.  She remains on aspirin for antiplatelet therapy.  She is tolerating the combination of a high intensity statin drug along with Zetia.  No changes are made in her medical regimen. 2. Blood pressure appears well controlled based on home readings.  She seems to have a component of whitecoat hypertension.  No changes are recommended to her medications. 3. The patient is treated with a combination of atorvastatin and Zetia.  Her lipids are excellent and reviewed as above.   Medication Adjustments/Labs and Tests Ordered: Current medicines are reviewed at length with the patient today.  Concerns regarding medicines are outlined above.  No orders of the defined types were placed in this encounter.  No orders of the defined types were placed in this encounter.   Patient  Instructions  Medication Instructions:  Your provider recommends that you continue on your current medications as directed. Please refer to the Current Medication list given to you today.    Labwork: None  Testing/Procedures: None  Follow-Up: Your provider wants you to follow-up in: 1 year with Dr. Burt Knack. You will receive a reminder letter in the mail two months in advance. If you don't receive a letter, please call our office to schedule the follow-up appointment.        Signed, Sherren Mocha, MD  01/20/2019 9:42 AM    [place]

## 2019-01-27 ENCOUNTER — Other Ambulatory Visit: Payer: Self-pay | Admitting: Cardiovascular Disease

## 2019-02-10 ENCOUNTER — Ambulatory Visit: Payer: Medicare HMO | Admitting: Sports Medicine

## 2019-02-10 ENCOUNTER — Encounter: Payer: Self-pay | Admitting: Sports Medicine

## 2019-02-10 VITALS — BP 146/70 | HR 60 | Ht 65.0 in | Wt 150.6 lb

## 2019-02-10 DIAGNOSIS — M25512 Pain in left shoulder: Secondary | ICD-10-CM

## 2019-02-10 DIAGNOSIS — G8929 Other chronic pain: Secondary | ICD-10-CM

## 2019-02-10 DIAGNOSIS — M9906 Segmental and somatic dysfunction of lower extremity: Secondary | ICD-10-CM

## 2019-02-10 DIAGNOSIS — M75102 Unspecified rotator cuff tear or rupture of left shoulder, not specified as traumatic: Secondary | ICD-10-CM

## 2019-02-10 DIAGNOSIS — M25559 Pain in unspecified hip: Secondary | ICD-10-CM

## 2019-02-10 DIAGNOSIS — M9907 Segmental and somatic dysfunction of upper extremity: Secondary | ICD-10-CM

## 2019-02-10 DIAGNOSIS — M25552 Pain in left hip: Secondary | ICD-10-CM | POA: Insufficient documentation

## 2019-02-10 NOTE — Progress Notes (Signed)
Jennifer Gross. Jennifer Gross, [facility] at [facility] [phone]  Jennifer Gross - 78 y.o. female MRN [id number]  Date of birth: 1941-05-12  Visit Date: February 10, 2019  PCP: Marin Olp, MD   Referred by: Marin Olp, MD  SUBJECTIVE:  Chief Complaint  Patient presents with  . Follow-up    L hip and L shoulder.  Nitro.  HEP of scap stab, RC and hip aBd    HPI: Patient is here for 6-week follow-up of both left shoulder and hip pain.  She is having pain that ranges from mild to severe.  Overall however she reports a 40 to 50% improvement in her symptoms.  She is using nitroglycerin patch without significant side effects.  She has no nighttime awakenings due to this anymore.  REVIEW OF SYSTEMS: Denies fevers, chills, recent weight gain or weight loss.  No night sweats.  Pt denies any change in bowel or bladder habits, muscle weakness, numbness or falls associated with this pain.  HISTORY:  Prior history reviewed and updated per electronic medical record.  Patient Active Problem List   Diagnosis Date Noted  . Left hip pain 02/10/2019    L hip XR - 12/30/18   . Left shoulder pain 02/10/2019    L shoulder XR - 12/30/18   . S/P laparoscopic assisted vaginal hysterectomy (LAVH) 07/20/2018  . Preop cardiovascular exam 06/29/2018  . Chronic pain of right knee 10/12/2017  . History of basal cell carcinoma of skin     Back. Followed by Dr. Allyson Sabal q3-12 months   . Osteoporosis     Now on alendronate, vitamin D through Dr. Jenny Reichmann [facility].  Dexa  04/2015.  Osteoporosis -2.8 lumbar spine, R femur neck -2.4, left femur neck -2.1. Dr. Radene Knee stated stable and continue vit D   . CAD (coronary artery disease) 11/10/2009    Dr. Burt Knack. ASA, atorvastatin 80mg  Cath/stent 2008. Hx MI.  plavix in past  ASSESSMENT:  1. Anterolateral ST elevation myocardial infarction, with successful  percutaneous coronary intervention of the left  anterior descending  diagonal bifurcation.  2. Normal left circumflex.  3. Moderate right coronary artery stenosis.  4. Mild to moderate segmental left ventricular dysfunction.     Marland Kitchen GERD 05/05/2008    Omeprazole 20mg     . Allergic rhinitis 04/08/2008    claritin as needed.    . Hyperlipidemia 03/29/2008    Atorvastatin 80mg , zetia 10mg      . Essential hypertension 03/29/2008    Losartan 100mg , carvedilol 25mg  BID, hctz 25mg --> 12.5mg     Social History   Occupational History  . Occupation: retired  Tobacco Use  . Smoking status: Never Smoker  . Smokeless tobacco: Never Used  Substance and Sexual Activity  . Alcohol use: No  . Drug use: No  . Sexual activity: Not on file   Social History   Social History Narrative   Married 1970. 2 living children. 4 children (lost one at age 74 drug related to heart attack and one at 35 to heart attack not drug related), 6 grandkids. 2 greatgrandkids with 1 on the way March 2016.       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 No acute distress.  Alert and appropriate. Her left shoulder is well aligned with anterior carriage.  She has good intrinsic rotator cuff strength with normal internal rotation, external rotation, empty can and speeds testing without significant pain in these locations.  Small amount of pain with Hawkins but no pain with Neer's testing.  Worse pain with external rotation and abduction such as when getting on her seatbelt.  Her left hip has tenderness over the greater trochanter that is mild.  She has tenderness mainly over the gluteal tendons.  She has poor glue medius recruitment with side-lying hip abduction.  Left-sided hip flexor contracture.   ASSESSMENT:  1. Greater trochanteric pain syndrome     2. Left hip pain   3. Chronic left shoulder pain   4. Rotator cuff syndrome of left shoulder   5. Somatic dysfunction of lower extremity   6. Somatic dysfunction of upper extremity     PROCEDURES:  PROCEDURE NOTE: OSTEOPATHIC MANIPULATION   The decision today to treat with Osteopathic Manipulative Therapy (OMT) was based on physical exam findings. Verbal consent was obtained following a discussion with the patient regarding the of risks, benefits and potential side effects, including an acute pain flare,post manipulation soreness and need for repeat treatments.  NONE  Manipulation was performed as below:  [facility] Treated & Osteopathic Exam Findings   UPPER EXTREMITIES:   Left pec minor tenderpoint Left trapezius tenderpoint LOWER EXTREMITIES:   Left gluteal tendon trigger point with left hip flexor contracture    OMT Techniques Used:   HVLA muscle energy myofascial release    The patient tolerated the treatment well and reported Improved symptoms following treatment today. Patient was given medications, exercises, stretches and lifestyle modifications per AVS and verbally.       PLAN:  Pertinent additional documentation may be included in corresponding procedure notes, imaging studies, problem based documentation and patient instructions.  No problem-specific Assessment & Plan notes found for this encounter.  She is doing better overall.  She has been compliant with her home exercise program 3 days/week and we did discuss slight modifications to ensure good glute contraction she does not seem to be missing this at this time.  Discussed that tendon of pathic changes likely will take between the 12 to 24 weeks previously discussed.  Continue previously prescribed home exercise program.   TENDINOPATHY - Discussed that the anticipated amount of time for healing is 12- 24 weeks for Tendinopathic changes.  Emphasized the importance of improving blood flow as well as eccentric  loading of the tendon.   Osteopathic manipulation was performed today based on physical exam findings.  Patient was counseled on the purpose and expected outcome of osteopathic manipulation and understands that a single treatment may not provide permanent long lasting relief.  They understand that home therapeutic exercises are critical part of the healing/treatment process and will continue with self treatment between now and their next visit as outlined.  The patient understands that the frequency of visits is meant to provide a stimulus to promote the body's own ability to heal and is not meant to be the sole means for improvement in their symptoms.  Activity modifications and the importance of avoiding exacerbating activities (limiting pain to no more than a 4 / 10 during or following activity) recommended and discussed.   Discussed red flag symptoms that warrant earlier emergent evaluation and patient voices understanding.    No orders of the defined types  were placed in this encounter. Lab Orders  No laboratory test(s) ordered today   Imaging Orders  No imaging studies ordered today   Referral Orders  No referral(s) requested today      Return in about 6 weeks (around 03/24/2019) for consideration of repeat Osteopathic Manipulation, consider .          Gerda Diss, [facility] Physician

## 2019-03-24 ENCOUNTER — Ambulatory Visit: Payer: Medicare HMO | Admitting: Sports Medicine

## 2019-04-27 ENCOUNTER — Ambulatory Visit (INDEPENDENT_AMBULATORY_CARE_PROVIDER_SITE_OTHER): Payer: Medicare HMO | Admitting: Family Medicine

## 2019-04-27 ENCOUNTER — Encounter: Payer: Self-pay | Admitting: Family Medicine

## 2019-04-27 VITALS — BP 133/72 | HR 57 | Temp 96.7°F | Ht 65.0 in | Wt 149.0 lb

## 2019-04-27 DIAGNOSIS — I2510 Atherosclerotic heart disease of native coronary artery without angina pectoris: Secondary | ICD-10-CM

## 2019-04-27 DIAGNOSIS — K219 Gastro-esophageal reflux disease without esophagitis: Secondary | ICD-10-CM

## 2019-04-27 DIAGNOSIS — M25552 Pain in left hip: Secondary | ICD-10-CM

## 2019-04-27 DIAGNOSIS — E785 Hyperlipidemia, unspecified: Secondary | ICD-10-CM | POA: Diagnosis not present

## 2019-04-27 DIAGNOSIS — I10 Essential (primary) hypertension: Secondary | ICD-10-CM

## 2019-04-27 MED ORDER — NITROGLYCERIN 0.4 MG SL SUBL: 0.4000 mg | SUBLINGUAL_TABLET | SUBLINGUAL | 2 refills | Status: DC | PRN

## 2019-04-27 MED ORDER — FAMOTIDINE 20 MG PO TABS: 20.0000 mg | ORAL_TABLET | Freq: Two times a day (BID) | ORAL | 3 refills | Status: DC | PRN

## 2019-04-27 MED ORDER — DICLOFENAC SODIUM 1 % TD GEL: 2.0000 g | Freq: Three times a day (TID) | TRANSDERMAL | 5 refills | Status: AC | PRN

## 2019-04-27 MED ORDER — HYDROCHLOROTHIAZIDE 25 MG PO TABS: 25.0000 mg | ORAL_TABLET | Freq: Every day | ORAL | 3 refills | Status: DC

## 2019-04-27 MED ORDER — ATORVASTATIN CALCIUM 80 MG PO TABS: ORAL_TABLET | ORAL | 3 refills | Status: DC

## 2019-04-27 NOTE — Patient Instructions (Addendum)
There are no preventive care reminders to display for this patient.  Depression screen [facility] 2/9 04/27/2019 05/11/2018 05/07/2017  Decreased Interest 0 0 0  Down, Depressed, Hopeless 0 0 0  PHQ - 2 Score 0 0 0   phone visit

## 2019-04-27 NOTE — Progress Notes (Signed)
Phone [phone]   Subjective:  Virtual visit via phonenote Chief Complaint  Patient presents with  . Follow-up    4 month f/u -hypertension, CAD   This visit type was conducted due to national recommendations for restrictions regarding the COVID-19 Pandemic (e.g. social distancing).  This format is felt to be most appropriate for this patient at this time balancing risks to patient and risks to population by having him in for in person visit.  All issues noted in this document were discussed and addressed.  No physical exam was performed (except for noted visual exam or audio findings with Telehealth visits).  The patient has consented to conduct a Telehealth visit and understands insurance will be billed.   Our team/I connected with Beaulah Corin on 04/27/19 at  9:20 AM EDT by phone (patient did not have equipment for webex) and verified that I am speaking with the correct person using two identifiers.  Location patient: [facility] Location provider: [facility], office Persons participating in the virtual visit:  patient  Time on phone: 13 minutes Counseling provided about covid 19 and social distancing- they are being very cautious.   Our team/I discussed the limitations of evaluation and management by telemedicine and the availability of in person appointments. In light of current covid-19 pandemic, patient also understands that we are trying to protect them by minimizing in office contact if at all possible.  The patient expressed consent for telemedicine visit and agreed to proceed. Patient understands insurance will be billed.   ROS- no fever/chills/cough (other than mild cough with allergies /shortness of breath/sore throat . No chest pain  Past Medical History-  Patient Active Problem List   Diagnosis Date Noted  . CAD (coronary artery disease) 11/10/2009    Priority: High  . Osteoporosis     Priority: Medium  . Hyperlipidemia 03/29/2008    Priority: Medium  . Essential  hypertension 03/29/2008    Priority: Medium  . History of basal cell carcinoma of skin     Priority: Low  . GERD 05/05/2008    Priority: Low  . Allergic rhinitis 04/08/2008    Priority: Low  . Left hip pain 02/10/2019  . Left shoulder pain 02/10/2019  . S/P laparoscopic assisted vaginal hysterectomy (LAVH) 07/20/2018  . Preop cardiovascular exam 06/29/2018  . Chronic pain of right knee 10/12/2017    Medications- reviewed and updated Current Outpatient Medications  Medication Sig Dispense Refill  . acetaminophen (TYLENOL) 500 MG tablet Take 1,000 mg by mouth 2 (two) times daily as needed for moderate pain or headache.    . ALENDRONATE SODIUM PO Take 150 mg by mouth every 30 (thirty) days.    Marland Kitchen aspirin 81 MG tablet Take 81 mg by mouth every evening.     Marland Kitchen atorvastatin (LIPITOR) 80 MG tablet TAKE 1 TABLET BY MOUTH ONCE DAILY OR AS DIRECTED 90 tablet 3  . carvedilol (COREG) 25 MG tablet TAKE 1 TABLET BY MOUTH TWICE DAILY 180 tablet 3  . diclofenac sodium (VOLTAREN) 1 % GEL Apply 2 g topically 3 (three) times daily as needed. 100 g 5  . ezetimibe (ZETIA) 10 MG tablet Take 1 tablet (10 mg total) by mouth daily. 90 tablet 3  . famotidine (PEPCID) 20 MG tablet Take 1 tablet (20 mg total) by mouth 2 (two) times daily as needed for heartburn or indigestion. 180 tablet 3  . hydrochlorothiazide (HYDRODIURIL) 25 MG tablet Take 1 tablet (25 mg total) by mouth daily. 90 tablet 3  .  losartan (COZAAR) 100 MG tablet TAKE ONE TABLET BY MOUTH ONCE DAILY 90 tablet 2  . nitroGLYCERIN (NITROSTAT) 0.4 MG SL tablet Place 1 tablet (0.4 mg total) under the tongue every 5 (five) minutes as needed for chest pain. 25 tablet 2   No current facility-administered medications for this visit.      Objective:  BP 133/72   Pulse (!) 57   Temp (!) 96.7 F (35.9 C) (Oral)   Ht 5\' 5"  (1.651 m)   Wt 149 lb (67.6 kg)   BMI 24.79 kg/m  self reported vitals  Nonlabored voice, normal speech     Assessment and Plan    # OA multiple joints S:nitroglycerin patch did not seem to help after seeing Dr. Paulla Fore previously- particularly the left hip and left shoulder. She did have good relief with voltaren gel and would like a refill.   A/P: Stable. Continue current medications. Refilled voltaren gel as doing well with this.   #hyperlipidemia/ CAD S: well controlled on atorvastatin 80mg  along with zetia 10mg   No chest pain or shortness of breath. Still taking aspirin. Saw Dr. Burt Knack in January Lab Results  Component Value Date   CHOL 151 05/11/2018   HDL 56.80 05/11/2018   LDLCALC 82 05/11/2018   LDLDIRECT 57.0 11/11/2018   TRIG 61.0 05/11/2018   CHOLHDL 3 05/11/2018   A/P: Stable. Continue current medications.  Update cholesterol at physical - hopeful within 3-6 months  #hypertension S: controlled on hctz 25mg , losartan 100mg , coreg 25mg  BID.  BP Readings from Last 3 Encounters:  04/27/19 133/72  02/10/19 (!) 146/70  01/20/19 138/88  A/P: Stable. Continue current medications.   # GERD S:has been doing well just on pepcid  A/P: Stable. Continue current medications.    3-6 months in person physical recommended - transferred her to schedule Future Appointments  Date Time Provider [place]  11/10/2019  9:20 AM Marin Olp, MD [facility]   Lab/Order associations: Hyperlipidemia, unspecified hyperlipidemia type  Benign essential HTN  Coronary artery disease involving native coronary artery of native heart without angina pectoris  Essential hypertension  Gastroesophageal reflux disease without esophagitis  Meds ordered this encounter  Medications  . diclofenac sodium (VOLTAREN) 1 % GEL    Sig: Apply 2 g topically 3 (three) times daily as needed.    Dispense:  100 g    Refill:  5  . atorvastatin (LIPITOR) 80 MG tablet    Sig: TAKE 1 TABLET BY MOUTH ONCE DAILY OR AS DIRECTED    Dispense:  90 tablet    Refill:  3  . famotidine (PEPCID) 20 MG tablet    Sig: Take 1 tablet  (20 mg total) by mouth 2 (two) times daily as needed for heartburn or indigestion.    Dispense:  180 tablet    Refill:  3  . hydrochlorothiazide (HYDRODIURIL) 25 MG tablet    Sig: Take 1 tablet (25 mg total) by mouth daily.    Dispense:  90 tablet    Refill:  3  . nitroGLYCERIN (NITROSTAT) 0.4 MG SL tablet    Sig: Place 1 tablet (0.4 mg total) under the tongue every 5 (five) minutes as needed for chest pain.    Dispense:  25 tablet    Refill:  2    Return precautions advised.  Garret Reddish, MD

## 2019-04-27 NOTE — Assessment & Plan Note (Signed)
#   OA multiple joints S:nitroglycerin patch did not seem to help after seeing Dr. Paulla Fore previously- particularly the left hip and left shoulder. She did have good relief with voltaren gel and would like a refill.   A/P: Stable. Continue current medications. Refilled voltaren gel as doing well with this..  Nitroglycerin patches did not help.

## 2019-05-10 ENCOUNTER — Ambulatory Visit: Payer: Medicare HMO | Admitting: Cardiovascular Disease

## 2019-05-19 DIAGNOSIS — Z01419 Encounter for gynecological examination (general) (routine) without abnormal findings: Secondary | ICD-10-CM | POA: Diagnosis not present

## 2019-05-19 DIAGNOSIS — Z6825 Body mass index (BMI) 25.0-25.9, adult: Secondary | ICD-10-CM | POA: Diagnosis not present

## 2019-05-19 DIAGNOSIS — Z1231 Encounter for screening mammogram for malignant neoplasm of breast: Secondary | ICD-10-CM | POA: Diagnosis not present

## 2019-05-19 LAB — HM MAMMOGRAPHY

## 2019-06-21 DIAGNOSIS — L249 Irritant contact dermatitis, unspecified cause: Secondary | ICD-10-CM | POA: Diagnosis not present

## 2019-06-21 DIAGNOSIS — Z85828 Personal history of other malignant neoplasm of skin: Secondary | ICD-10-CM | POA: Diagnosis not present

## 2019-06-21 DIAGNOSIS — L814 Other melanin hyperpigmentation: Secondary | ICD-10-CM | POA: Diagnosis not present

## 2019-06-21 DIAGNOSIS — D1801 Hemangioma of skin and subcutaneous tissue: Secondary | ICD-10-CM | POA: Diagnosis not present

## 2019-06-21 DIAGNOSIS — D229 Melanocytic nevi, unspecified: Secondary | ICD-10-CM | POA: Diagnosis not present

## 2019-06-21 DIAGNOSIS — L821 Other seborrheic keratosis: Secondary | ICD-10-CM | POA: Diagnosis not present

## 2019-06-21 DIAGNOSIS — L570 Actinic keratosis: Secondary | ICD-10-CM | POA: Diagnosis not present

## 2019-06-21 DIAGNOSIS — L819 Disorder of pigmentation, unspecified: Secondary | ICD-10-CM | POA: Diagnosis not present

## 2019-07-06 ENCOUNTER — Encounter: Payer: Self-pay | Admitting: Internal Medicine

## 2019-07-31 ENCOUNTER — Other Ambulatory Visit: Payer: Self-pay | Admitting: Cardiovascular Disease

## 2019-08-09 ENCOUNTER — Other Ambulatory Visit: Payer: Self-pay | Admitting: Cardiology

## 2019-08-25 ENCOUNTER — Encounter: Payer: Self-pay | Admitting: Family Medicine

## 2019-08-25 ENCOUNTER — Other Ambulatory Visit: Payer: Self-pay

## 2019-08-25 ENCOUNTER — Ambulatory Visit (INDEPENDENT_AMBULATORY_CARE_PROVIDER_SITE_OTHER): Payer: Medicare HMO

## 2019-08-25 DIAGNOSIS — Z23 Encounter for immunization: Secondary | ICD-10-CM

## 2019-09-07 ENCOUNTER — Telehealth: Payer: Self-pay | Admitting: Family Medicine

## 2019-09-07 NOTE — Telephone Encounter (Signed)
I left a message asking the patient to call me at [phone] to schedule AWV with Courtney. VDM (Dee-Dee) °

## 2019-10-12 DIAGNOSIS — H2513 Age-related nuclear cataract, bilateral: Secondary | ICD-10-CM | POA: Diagnosis not present

## 2019-10-12 DIAGNOSIS — H5203 Hypermetropia, bilateral: Secondary | ICD-10-CM | POA: Diagnosis not present

## 2019-11-10 ENCOUNTER — Other Ambulatory Visit: Payer: Self-pay

## 2019-11-10 ENCOUNTER — Ambulatory Visit (INDEPENDENT_AMBULATORY_CARE_PROVIDER_SITE_OTHER): Payer: Medicare HMO | Admitting: Family Medicine

## 2019-11-10 ENCOUNTER — Encounter: Payer: Self-pay | Admitting: Family Medicine

## 2019-11-10 VITALS — BP 138/88 | HR 84 | Temp 98.3°F | Ht 65.0 in | Wt 152.0 lb

## 2019-11-10 DIAGNOSIS — Z0000 Encounter for general adult medical examination without abnormal findings: Secondary | ICD-10-CM

## 2019-11-10 DIAGNOSIS — I2510 Atherosclerotic heart disease of native coronary artery without angina pectoris: Secondary | ICD-10-CM

## 2019-11-10 DIAGNOSIS — R739 Hyperglycemia, unspecified: Secondary | ICD-10-CM

## 2019-11-10 DIAGNOSIS — M810 Age-related osteoporosis without current pathological fracture: Secondary | ICD-10-CM | POA: Diagnosis not present

## 2019-11-10 DIAGNOSIS — K219 Gastro-esophageal reflux disease without esophagitis: Secondary | ICD-10-CM

## 2019-11-10 DIAGNOSIS — Z85828 Personal history of other malignant neoplasm of skin: Secondary | ICD-10-CM | POA: Diagnosis not present

## 2019-11-10 DIAGNOSIS — I10 Essential (primary) hypertension: Secondary | ICD-10-CM | POA: Diagnosis not present

## 2019-11-10 DIAGNOSIS — E785 Hyperlipidemia, unspecified: Secondary | ICD-10-CM

## 2019-11-10 LAB — LIPID PANEL
Cholesterol: 138 mg/dL (ref 0–200)
HDL: 52.8 mg/dL (ref >39.00)
LDL Cholesterol: 70 mg/dL (ref 0–99)
NonHDL: 85.1
Total CHOL/HDL Ratio: 3
Triglycerides: 76 mg/dL (ref 0.0–149.0)
VLDL: 15.2 mg/dL (ref 0.0–40.0)

## 2019-11-10 LAB — CBC WITH DIFFERENTIAL/PLATELET
Basophils Absolute: 0 10*3/uL (ref 0.0–0.1)
Basophils Relative: 0.3 % (ref 0.0–3.0)
Eosinophils Absolute: 0.2 10*3/uL (ref 0.0–0.7)
Eosinophils Relative: 1.6 % (ref 0.0–5.0)
HCT: 40 % (ref 36.0–46.0)
Hemoglobin: 13.3 g/dL (ref 12.0–15.0)
Lymphocytes Relative: 21.6 % (ref 12.0–46.0)
Lymphs Abs: 2.1 10*3/uL (ref 0.7–4.0)
MCHC: 33.2 g/dL (ref 30.0–36.0)
MCV: 93.3 fl (ref 78.0–100.0)
Monocytes Absolute: 0.6 10*3/uL (ref 0.1–1.0)
Monocytes Relative: 5.9 % (ref 3.0–12.0)
Neutro Abs: 6.9 10*3/uL (ref 1.4–7.7)
Neutrophils Relative %: 70.6 % (ref 43.0–77.0)
Platelets: 231 10*3/uL (ref 150.0–400.0)
RBC: 4.29 Mil/uL (ref 3.87–5.11)
RDW: 14.4 % (ref 11.5–15.5)
WBC: 9.8 10*3/uL (ref 4.0–10.5)

## 2019-11-10 LAB — COMPREHENSIVE METABOLIC PANEL
ALT: 14 U/L (ref 0–35)
AST: 16 U/L (ref 0–37)
Albumin: 4.3 g/dL (ref 3.5–5.2)
Alkaline Phosphatase: 88 U/L (ref 39–117)
BUN: 23 mg/dL (ref 6–23)
CO2: 29 mEq/L (ref 19–32)
Calcium: 9.7 mg/dL (ref 8.4–10.5)
Chloride: 105 mEq/L (ref 96–112)
Creatinine, Ser: 0.95 mg/dL (ref 0.40–1.20)
GFR: 56.72 mL/min — ABNORMAL LOW (ref >60.00)
Glucose, Bld: 101 mg/dL — ABNORMAL HIGH (ref 70–99)
Potassium: 4.2 mEq/L (ref 3.5–5.1)
Sodium: 141 mEq/L (ref 135–145)
Total Bilirubin: 0.8 mg/dL (ref 0.2–1.2)
Total Protein: 7 g/dL (ref 6.0–8.3)

## 2019-11-10 LAB — HEMOGLOBIN A1C: Hgb A1c MFr Bld: 6.1 % (ref 4.6–6.5)

## 2019-11-10 NOTE — Progress Notes (Addendum)
Phone: [phone]   Subjective:  Patient presents today for their annual physical. Chief complaint-noted.   See problem oriented charting- ROS- full  review of systems was completed and negative except for: seasonal allergies, cough, joint pain  The following were reviewed and entered/updated in epic: Past Medical History:  Diagnosis Date   ALLERGIC RHINITIS 04/08/2008   Allergy    Basal cell carcinoma    back   CAD (coronary artery disease) 2008   status post anterior myocardial infarction, treated w/ a dru-eluting stent to the LAD   CAD, NATIVE VESSEL 11/10/2009   GERD (gastroesophageal reflux disease)    Hiatal hernia    HYPERLIPIDEMIA 03/29/2008   HYPERTENSION 03/29/2008   Myocardial infarction [facility])    2008   Osteopenia    Patient Active Problem List   Diagnosis Date Noted   CAD (coronary artery disease) 11/10/2009    Priority: High   Osteoporosis     Priority: Medium   Hyperlipidemia 03/29/2008    Priority: Medium   Essential hypertension 03/29/2008    Priority: Medium   History of basal cell carcinoma of skin     Priority: Low   GERD 05/05/2008    Priority: Low   Allergic rhinitis 04/08/2008    Priority: Low   Left hip pain 02/10/2019   Left shoulder pain 02/10/2019   S/P laparoscopic assisted vaginal hysterectomy (LAVH) 07/20/2018   Preop cardiovascular exam 06/29/2018   Chronic pain of right knee 10/12/2017   Past Surgical History:  Procedure Laterality Date   ANTERIOR AND POSTERIOR REPAIR WITH SACROSPINOUS FIXATION N/A 07/20/2018   Procedure: ANTERIOR AND POSTERIOR REPAIR WITH SACROSPINOUS FIXATION;  Surgeon: Arvella Nigh, MD;  Location: [facility];  Service: Gynecology;  Laterality: N/A;   APPENDECTOMY     BASAL CELL CARCINOMA EXCISION     back   BLADDER SUSPENSION N/A 07/20/2018   Procedure: TRANSOBTURATOR Margarita Grizzle;  Surgeon: Arvella Nigh, MD;  Location: [facility];  Service: Gynecology;  Laterality: N/A;   LAPAROSCOPIC  VAGINAL HYSTERECTOMY WITH SALPINGO OOPHORECTOMY Bilateral 07/20/2018   Procedure: LAPAROSCOPIC ASSISTED VAGINAL HYSTERECTOMY WITH SALPINGO OOPHORECTOMY;  Surgeon: Arvella Nigh, MD;  Location: [facility];  Service: Gynecology;  Laterality: Bilateral;   Percutaneous transluminal coronary angioplasty and drug-eluting  09/2007   Stent placement in the left anterior descending   WISDOM TOOTH EXTRACTION      Family History  Problem Relation Age of Onset   Coronary artery disease Mother    Lung cancer Father    Heart disease Brother        stent placed   Coronary artery disease Maternal Uncle    CAD Son        died 13 of MI   Colon cancer Neg Hx    Esophageal cancer Neg Hx    Rectal cancer Neg Hx    Stomach cancer Neg Hx     Medications- reviewed and updated Current Outpatient Medications  Medication Sig Dispense Refill   acetaminophen (TYLENOL) 500 MG tablet Take 1,000 mg by mouth 2 (two) times daily as needed for moderate pain or headache.     ALENDRONATE SODIUM PO Take 150 mg by mouth every 30 (thirty) days.     aspirin 81 MG tablet Take 81 mg by mouth every evening.      atorvastatin (LIPITOR) 80 MG tablet TAKE 1 TABLET BY MOUTH ONCE DAILY OR AS DIRECTED 90 tablet 3   carvedilol (COREG) 25 MG tablet TAKE 1 TABLET BY MOUTH TWICE DAILY 180 tablet 3  diclofenac sodium (VOLTAREN) 1 % GEL Apply 2 g topically 3 (three) times daily as needed. 100 g 5   ezetimibe (ZETIA) 10 MG tablet Take 1 tablet by mouth once daily 90 tablet 1   famotidine (PEPCID) 20 MG tablet Take 1 tablet (20 mg total) by mouth 2 (two) times daily as needed for heartburn or indigestion. 180 tablet 3   hydrochlorothiazide (HYDRODIURIL) 25 MG tablet Take 1 tablet (25 mg total) by mouth daily. 90 tablet 3   losartan (COZAAR) 100 MG tablet Take 1 tablet by mouth once daily 90 tablet 1   nitroGLYCERIN (NITROSTAT) 0.4 MG SL tablet Place 1 tablet (0.4 mg total) under the tongue every 5 (five) minutes as needed  for chest pain. 25 tablet 2   No current facility-administered medications for this visit.     Allergies-reviewed and updated Allergies  Allergen Reactions   Penicillins Rash    Has patient had a PCN reaction causing immediate rash, facial/tongue/throat swelling, SOB or lightheadedness with hypotension: Yes Has patient had a PCN reaction causing severe rash involving mucus membranes or skin necrosis: No Has patient had a PCN reaction that required hospitalization: No Has patient had a PCN reaction occurring within the last 10 years: No If all of the above answers are "NO", then may proceed with Cephalosporin use.    Social History   Social History Narrative   Married 1970. 2 living children. 4 children (lost one at age 15 drug related to heart attack and one at 21 to heart attack not drug related), 6 grandkids. 2 greatgrandkids with 1 on the way March 2016.       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 TM normal. Bridge of nose normal. Eyelids normal.  Neck: no thyromegaly or cervical lymphadenopathy  CV: RRR no murmurs rubs or gallops Lungs: CTAB no crackles, wheeze, rhonchi Abdomen: soft/nontender/nondistended/normal bowel sounds. No rebound or guarding.  Ext: no edema Skin: warm, dry Neuro: grossly normal, moves all extremities, PERRLA   Assessment and Plan   78 y.o. female presenting for annual physical.  Health Maintenance counseling: 1. Anticipatory guidance: Patient counseled regarding regular dental exams yes q6 months, eye exams - yearly yes,  avoiding smoking and second hand smoke yes, limiting alcohol to 0 beverage per day.   2. Risk factor reduction:  Advised patient of need for regular exercise and diet  rich and fruits and vegetables to reduce risk of heart attack and stroke. Exercise-house work, working in the yard- not walking as much as she did last year . Diet-cooks at home.  Weight up 2-3 pounds from last year - still close to normal BMI- discussed not gaining more weight Wt Readings from Last 3 Encounters:  11/10/19 152 lb (68.9 kg)  04/27/19 149 lb (67.6 kg)  02/10/19 150 lb 9.6 oz (68.3 kg)  3. Immunizations/screenings/ancillary studies-fully up-to-date  Immunization History  Administered Date(s) Administered   Fluad Quad(high Dose 65+) 08/25/2019   Influenza Split 09/27/2011, 10/13/2012   Influenza Whole 09/29/2008, 09/27/2009, 10/04/2010   Influenza, High Dose Seasonal PF 09/24/2013, 10/04/2014, 09/28/2015, 09/27/2016, 08/28/2017,  09/21/2018   Pneumococcal Conjugate-13 12/02/2014   Pneumococcal Polysaccharide-23 03/29/2008   Td 03/29/2008   Tdap 12/02/2014   Zoster 02/23/2008   Zoster Recombinat (Shingrix) 05/26/2018, 08/25/2018   4. Cervical cancer screening- sees gynecology still.  Never had abnormal Pap smear.  Follows with Dr. Radene Knee- hysterectomy last year 5. Breast cancer screening-  breast exam yes and mammogram-with gynecology but past age based screening recommendations 6. Colon cancer screening -never had abnormal colonoscopy.  We tried to do a final Cologuard last year but was not covered so we deferred.  7. Skin cancer screening-follows with Dr. Ledell Peoples office every 6 months.advised regular sunscreen use. Denies worrisome, changing, or new skin lesions.  8. Birth control/STD check- no, monogamous and postmenopausal 9. Osteoporosis screening at 6- [facility]  They have continued vitamin D alone due to stability.  She had an updated bone density in 2019.  Will request updated records -never smoker  Status of chronic or acute concerns   Coronary artery disease involving native coronary artery of native heart without  angina pectoris-patient compliant with statin, aspirin, carvedilol.  Not having use nitroglycerin.  Follows with Dr. Burt Knack   Essential hypertension-controlled today on carvedilol, hydrochlorothiazide 12.5 mg, losartan 100 mg but high normal.  . Deals with variability from 90s to 150s even (may be getting dehydrated with the lows)- gets higher with higher levels of stress. Daughter in law Jan living with her is difficult and is depressed. Using wrist cuff- advised using arm cuff and update me with blood pressures on mychart in 1-2 weeks  Hyperlipidemia, unspecified hyperlipidemia type-we will update cholesterol today.  She is compliant with atorvastatin 80 mg.  Goal LDL less than 70. Update cholesterol today   Gastroesophageal reflux disease without esophagitis-controlled year on Zantac as needed-has transitioned to Pepcid last   Age-related osteoporosis without current pathological fracture-follows with gynecology as above   History of basal cell carcinoma of skin-follows dermatology as above   Chronic cough related to allergies- worse with perfumes etc- no recent change- does not need covid 19 testing unless acute change   Recommended follow up:  6 month follow up  Future Appointments  Date Time Provider [place]  03/09/2020  8:20 AM Sherren Mocha, MD [facility]   Lab/Order associations: fasting   ICD-10-CM   1. Preventative health care  Z00.00   2. Coronary artery disease involving native coronary artery of native heart without angina pectoris  I25.10   3. Essential hypertension  I10   4. Hyperlipidemia, unspecified hyperlipidemia type  E78.5   5. Gastroesophageal reflux disease without esophagitis  K21.9   6. Age-related osteoporosis without current pathological fracture  M81.0   7. History of basal cell carcinoma of skin  Z85.828     No orders of the defined types were placed in this encounter.   Return precautions advised.  Garret Reddish, MD

## 2019-11-10 NOTE — Patient Instructions (Addendum)
  Ms. Degon , Thank you for taking time to come for your Medicare Wellness Visit. I appreciate your ongoing commitment to your health goals. Please review the following plan we discussed and let me know if I can assist you in the future.   These are the goals we discussed: 1. Sign release of information at the check out desk for last bone density from gynecology 2. Using wrist cuff- advised using arm cuff and update me with blood pressures on mychart in 1-2 weeks 3. Restart your walking- you were doing a great job last year- lets target 3 days a week 4. 6 month follow up as long as things are going ok with covid 19    This is a list of the screening recommended for you and due dates:  Health Maintenance  Topic Date Due  . Tetanus Vaccine  12/02/2024  . Flu Shot  Completed  . DEXA scan (bone density measurement)  Completed  . Pneumonia vaccines  Completed

## 2019-11-10 NOTE — Progress Notes (Signed)
Phone: [phone]    Subjective:   Patient presents today for their annual wellness visit.    Preventive Screening-Counseling & Management Smoking Status: Never Smoker Second Hand Smoking status: No smokers in home Alcohol intake: 0 per week  Risk Factors Regular exercise:  Very active at home- encouraged her to consider restarting her walking- used to walk 3 days a week Diet: weight up slightly- encouraged weight maintenance and no further gain  Wt Readings from Last 3 Encounters:  11/10/19 152 lb (68.9 kg)  04/27/19 149 lb (67.6 kg)  02/10/19 150 lb 9.6 oz (68.3 kg)   Fall Risk: None  Fall Risk  11/10/2019 04/27/2019 05/11/2018 05/07/2017 05/07/2016  Falls in the past year? 0 0 No No No  Number falls in past yr: 0 0 - - -  Injury with Fall? 0 0 - - -  Opioid use history:  no long term opioids use  Cardiac risk factors:  Known CAD advanced age (older than 56 for men, 36 for women)  treated Hyperlipidemia  treated Hypertension  No diabetes. Will get a1c due to prior elevated a1c  Lab Results  Component Value Date   HGBA1C  10/22/2007    5.9 (NOTE)   The ADA recommends the following therapeutic goals for glycemic   control related to Hgb A1C measurement:   Goal of Therapy:   < 7.0% Hgb A1C   Action Suggested:  > 8.0% Hgb A1C   Ref:  Diabetes Care, 51, Suppl. 1, 1999  Family History: mom, son   Depression Screen None. PHQ2 0  Depression screen [facility] 2/9 11/10/2019 04/27/2019 05/11/2018 05/07/2017 05/07/2016  Decreased Interest 0 0 0 0 0  Down, Depressed, Hopeless 0 0 0 0 0  PHQ - 2 Score 0 0 0 0 0   Activities of Daily Living Independent ADLs and IADLs   Hearing Difficulties: -patient declines  Cognitive Testing             No reported trouble.   Mini cog: normal clock draw. 2/3 delayed recall. Normal test result   banana sunrise chair  List the Names of Other Physician/Practitioners you currently use: Patient Care Team: Marin Olp, MD as PCP - General (Family  Medicine) Sherren Mocha, MD as PCP - Cardiology (Cardiology)   Immunization History  Administered Date(s) Administered   Fluad Quad(high Dose 65+) 08/25/2019   Influenza Split 09/27/2011, 10/13/2012   Influenza Whole 09/29/2008, 09/27/2009, 10/04/2010   Influenza, High Dose Seasonal PF 09/24/2013, 10/04/2014, 09/28/2015, 09/27/2016, 08/28/2017, 09/21/2018   Pneumococcal Conjugate-13 12/02/2014   Pneumococcal Polysaccharide-23 03/29/2008   Td 03/29/2008   Tdap 12/02/2014   Zoster 02/23/2008   Zoster Recombinat (Shingrix) 05/26/2018, 08/25/2018   Required Immunizations needed today - fully up to date Health Maintenance  Topic Date Due   Tetanus Vaccine  12/02/2024   Flu Shot  Completed   DEXA scan (bone density measurement)  Completed   Pneumonia vaccines  Completed    Screening tests-  1. Colon cancer screening- passed age based screening 2. Lung Cancer screening- not a candidate 3. Skin cancer screening- q6 months 4. Cervical cancer screening- passed age based screening, hysterectomy last year 5. Breast cancer screening- with GYN- passed age based screeening  ROS- No pertinent positives discovered in course of AWV  The following were reviewed and entered/updated in epic: Past Medical History:  Diagnosis Date   ALLERGIC RHINITIS 04/08/2008   Allergy    Basal cell carcinoma    back   CAD (coronary artery  disease) 2008   status post anterior myocardial infarction, treated w/ a dru-eluting stent to the LAD   CAD, NATIVE VESSEL 11/10/2009   GERD (gastroesophageal reflux disease)    Hiatal hernia    HYPERLIPIDEMIA 03/29/2008   HYPERTENSION 03/29/2008   Myocardial infarction [facility])    2008   Osteopenia    Patient Active Problem List   Diagnosis Date Noted   CAD (coronary artery disease) 11/10/2009    Priority: High   Osteoporosis     Priority: Medium   Hyperlipidemia 03/29/2008    Priority: Medium   Essential hypertension 03/29/2008     Priority: Medium   History of basal cell carcinoma of skin     Priority: Low   GERD 05/05/2008    Priority: Low   Allergic rhinitis 04/08/2008    Priority: Low   Left hip pain 02/10/2019   Left shoulder pain 02/10/2019   S/P laparoscopic assisted vaginal hysterectomy (LAVH) 07/20/2018   Preop cardiovascular exam 06/29/2018   Chronic pain of right knee 10/12/2017   Past Surgical History:  Procedure Laterality Date   ANTERIOR AND POSTERIOR REPAIR WITH SACROSPINOUS FIXATION N/A 07/20/2018   Procedure: ANTERIOR AND POSTERIOR REPAIR WITH SACROSPINOUS FIXATION;  Surgeon: Arvella Nigh, MD;  Location: [place] [facility];  Service: Gynecology;  Laterality: N/A;   APPENDECTOMY     BASAL CELL CARCINOMA EXCISION     back   BLADDER SUSPENSION N/A 07/20/2018   Procedure: TRANSOBTURATOR Margarita Grizzle;  Surgeon: Arvella Nigh, MD;  Location: [place] [facility];  Service: Gynecology;  Laterality: N/A;   LAPAROSCOPIC VAGINAL HYSTERECTOMY WITH SALPINGO OOPHORECTOMY Bilateral 07/20/2018   Procedure: LAPAROSCOPIC ASSISTED VAGINAL HYSTERECTOMY WITH SALPINGO OOPHORECTOMY;  Surgeon: Arvella Nigh, MD;  Location: [facility];  Service: Gynecology;  Laterality: Bilateral;   Percutaneous transluminal coronary angioplasty and drug-eluting  09/2007   Stent placement in the left anterior descending   WISDOM TOOTH EXTRACTION      Family History  Problem Relation Age of Onset   Coronary artery disease Mother    Lung cancer Father    Heart disease Brother        stent placed   Coronary artery disease Maternal Uncle    CAD Son        died 68 of MI   Colon cancer Neg Hx    Esophageal cancer Neg Hx    Rectal cancer Neg Hx    Stomach cancer Neg Hx     Medications- reviewed and updated Current Outpatient Medications  Medication Sig Dispense Refill   acetaminophen (TYLENOL) 500 MG tablet Take 1,000 mg by mouth 2 (two) times daily as needed for moderate pain or headache.     ALENDRONATE SODIUM PO Take 150 mg by mouth  every 30 (thirty) days.     aspirin 81 MG tablet Take 81 mg by mouth every evening.      atorvastatin (LIPITOR) 80 MG tablet TAKE 1 TABLET BY MOUTH ONCE DAILY OR AS DIRECTED 90 tablet 3   carvedilol (COREG) 25 MG tablet TAKE 1 TABLET BY MOUTH TWICE DAILY 180 tablet 3   diclofenac sodium (VOLTAREN) 1 % GEL Apply 2 g topically 3 (three) times daily as needed. 100 g 5   ezetimibe (ZETIA) 10 MG tablet Take 1 tablet by mouth once daily 90 tablet 1   famotidine (PEPCID) 20 MG tablet Take 1 tablet (20 mg total) by mouth 2 (two) times daily as needed for heartburn or indigestion. 180 tablet 3   hydrochlorothiazide (HYDRODIURIL) 25 MG tablet Take 1  tablet (25 mg total) by mouth daily. 90 tablet 3   losartan (COZAAR) 100 MG tablet Take 1 tablet by mouth once daily 90 tablet 1   nitroGLYCERIN (NITROSTAT) 0.4 MG SL tablet Place 1 tablet (0.4 mg total) under the tongue every 5 (five) minutes as needed for chest pain. 25 tablet 2   No current facility-administered medications for this visit.     Allergies-reviewed and updated Allergies  Allergen Reactions   Penicillins Rash    Has patient had a PCN reaction causing immediate rash, facial/tongue/throat swelling, SOB or lightheadedness with hypotension: Yes Has patient had a PCN reaction causing severe rash involving mucus membranes or skin necrosis: No Has patient had a PCN reaction that required hospitalization: No Has patient had a PCN reaction occurring within the last 10 years: No If all of the above answers are "NO", then may proceed with Cephalosporin use.     Social History   Socioeconomic History   Marital status: Married    Spouse name: Not on file   Number of children: 4   Years of education: Not on file   Highest education level: Not on file  Occupational History   Occupation: retired  Scientist, product/process development strain: Not on file   Food insecurity    Worry: Not on file    Inability: Not on Energy manager needs    Medical: Not on file    Non-medical: Not on file  Tobacco Use   Smoking status: Never Smoker   Smokeless tobacco: Never Used  Substance and Sexual Activity   Alcohol use: No   Drug use: No   Sexual activity: Not on file  Lifestyle   Physical activity    Days per week: Not on file    Minutes per session: Not on file   Stress: Not on file  Relationships   Social connections    Talks on phone: Not on file    Gets together: Not on file    Attends religious service: Not on file    Active member of club or organization: Not on file    Attends meetings of clubs or organizations: Not on file    Relationship status: Not on file  Other Topics Concern   Not on file  Social History Narrative   Married 1970. 2 living children. 4 children (lost one at age 70 drug related to heart attack and one at 44 to heart attack not drug related), 6 grandkids. 2 greatgrandkids with 1 on the way March 2016.       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 See AVS as well which was given to patient.   Recommended follow up: 1 year medicare wellness visit and CPE Future Appointments  Date Time Provider [place]  03/09/2020  8:20 AM Sherren Mocha, MD [facility]     Lab/Order associations:   ICD-10-CM  1. Preventative health care  Z00.00  Return precautions advised. Garret Reddish, MD

## 2019-12-08 IMAGING — DX DG SHOULDER 2+V*L*
3 series · 3 of 3 positions shown · non-contrast
Comparison: No prior.

CLINICAL DATA: Left shoulder left hip pain.  No known injury.

EXAM:
LEFT SHOULDER - 2+ VIEW

[shoulder grashey ap]
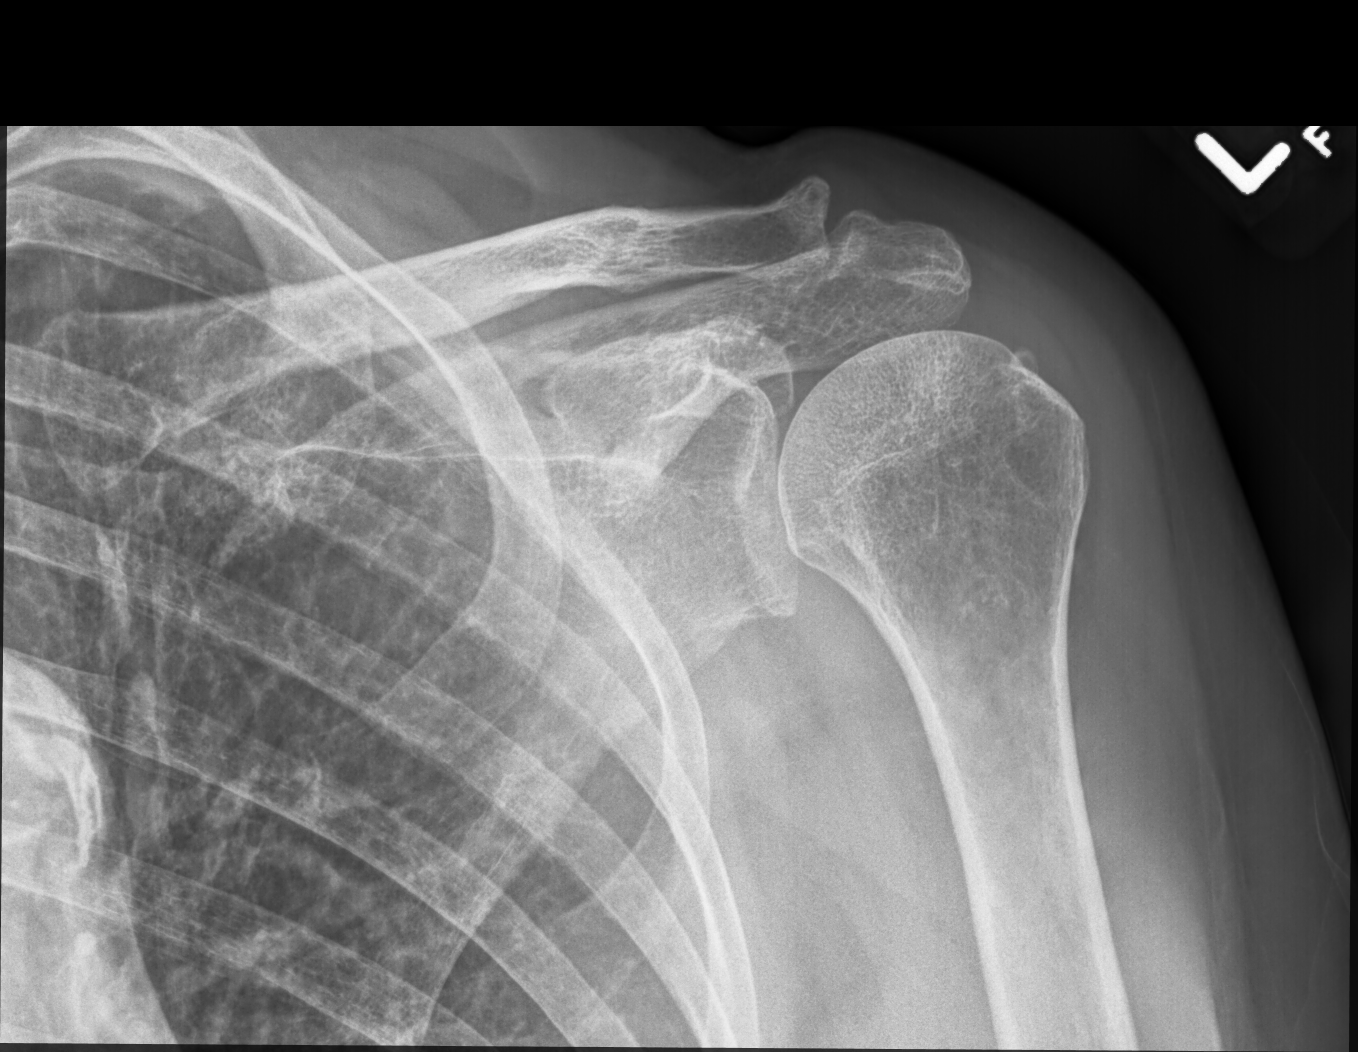

[shoulder y view]
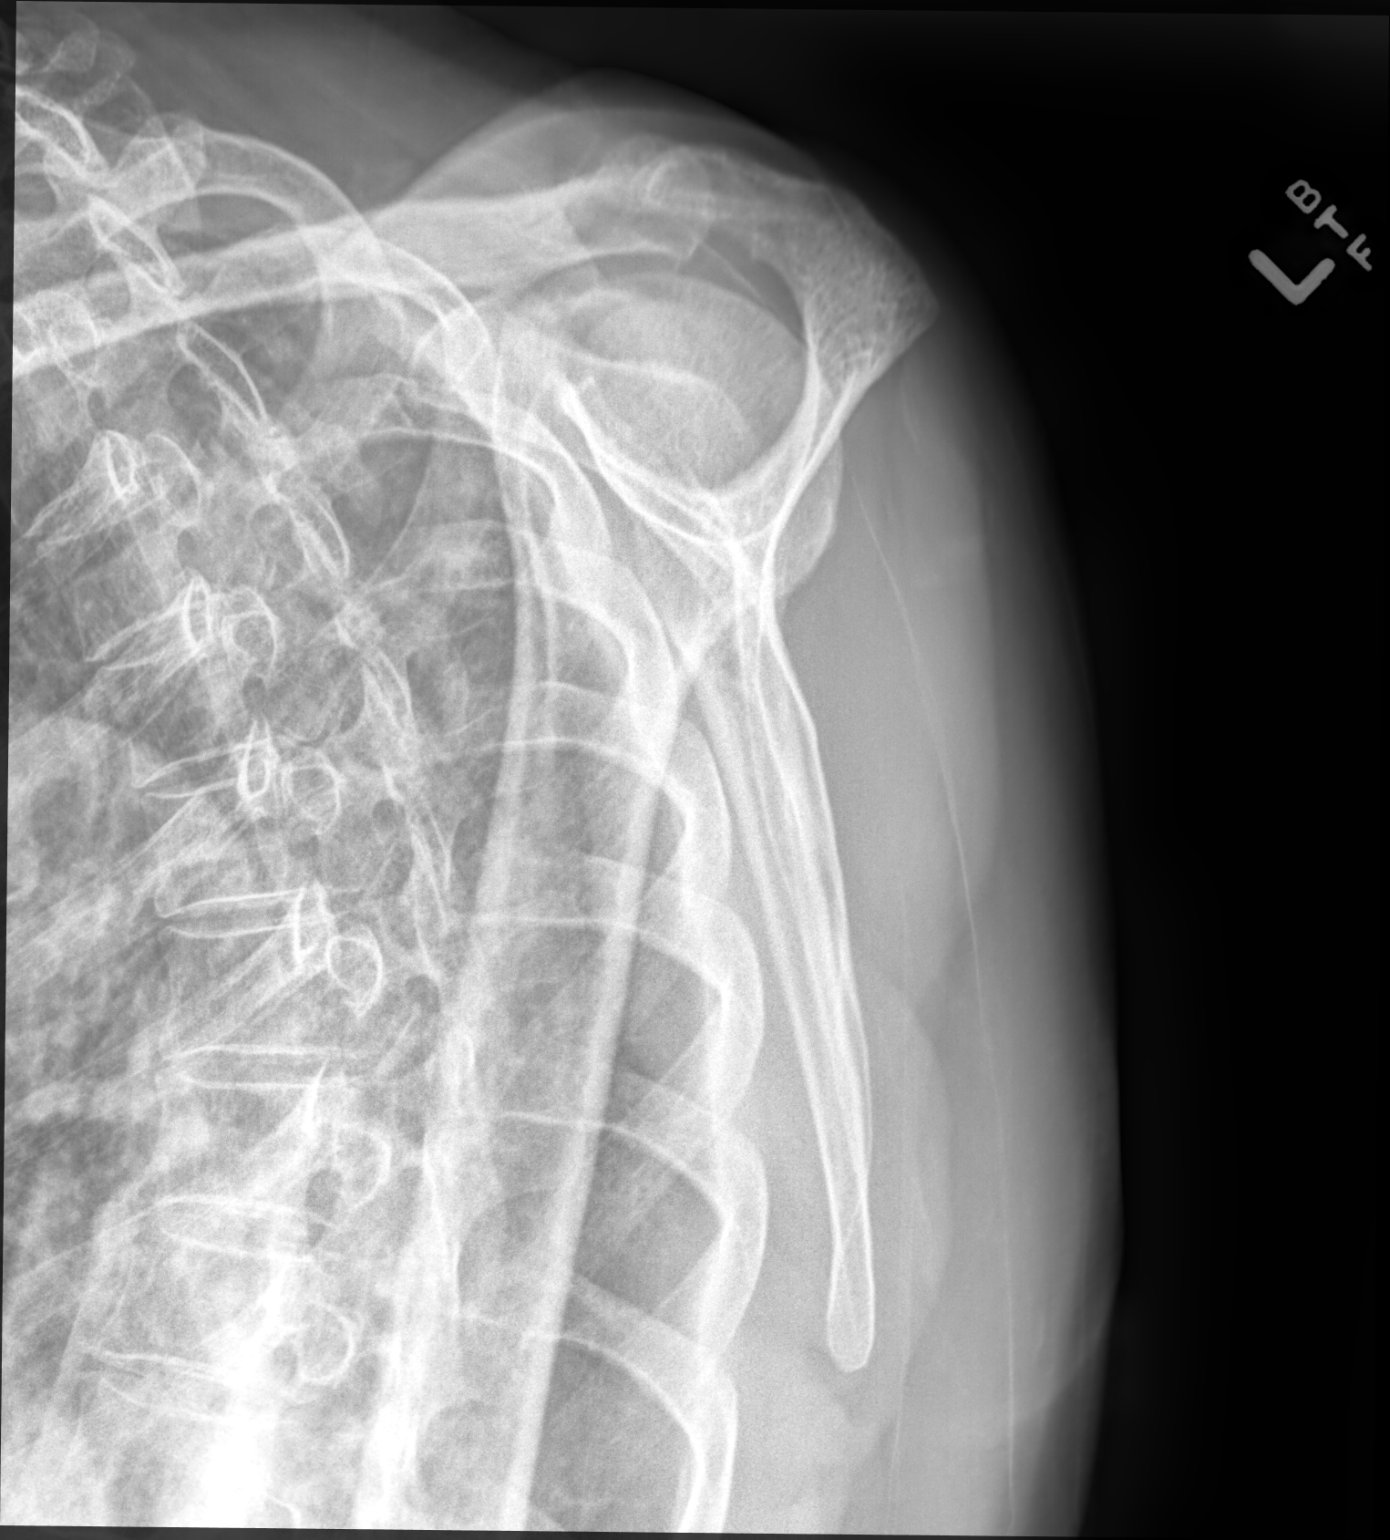

[shoulder axial]
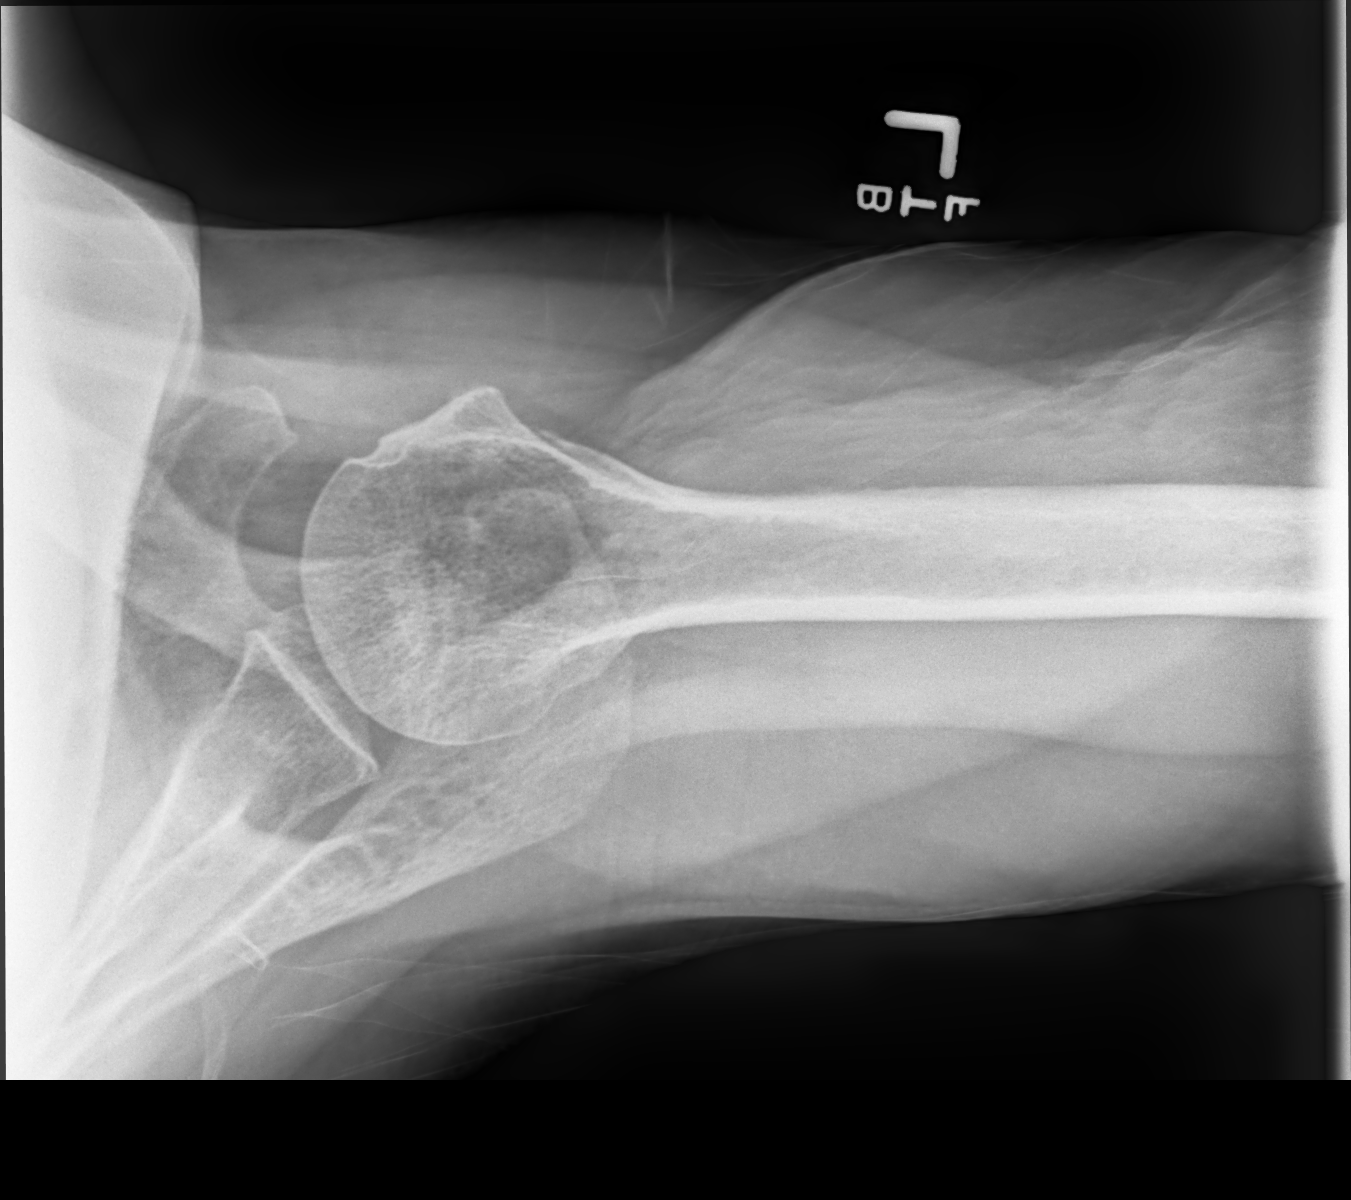

[3 of 3 positions shown; findings below may reference images not displayed]

FINDINGS: Left apical pleural thickening consistent with scarring. Diffuse
osteopenia. Acromioclavicular and glenohumeral degenerative change.
No acute bony abnormality identified. No evidence of fracture.
IMPRESSION: Diffuse osteopenia. Acromioclavicular glenohumeral degenerative
change. No acute abnormality

## 2019-12-20 ENCOUNTER — Encounter: Payer: Self-pay | Admitting: Family Medicine

## 2019-12-21 DIAGNOSIS — L578 Other skin changes due to chronic exposure to nonionizing radiation: Secondary | ICD-10-CM | POA: Diagnosis not present

## 2019-12-21 DIAGNOSIS — L570 Actinic keratosis: Secondary | ICD-10-CM | POA: Diagnosis not present

## 2020-01-24 ENCOUNTER — Ambulatory Visit: Payer: Medicare HMO | Admitting: Cardiovascular Disease

## 2020-01-24 ENCOUNTER — Encounter: Payer: Self-pay | Admitting: Cardiovascular Disease

## 2020-01-24 ENCOUNTER — Other Ambulatory Visit: Payer: Self-pay

## 2020-01-24 VITALS — BP 142/82 | HR 67 | Ht 65.0 in | Wt 152.4 lb

## 2020-01-24 DIAGNOSIS — I10 Essential (primary) hypertension: Secondary | ICD-10-CM

## 2020-01-24 DIAGNOSIS — E782 Mixed hyperlipidemia: Secondary | ICD-10-CM | POA: Diagnosis not present

## 2020-01-24 DIAGNOSIS — I2510 Atherosclerotic heart disease of native coronary artery without angina pectoris: Secondary | ICD-10-CM | POA: Diagnosis not present

## 2020-01-24 NOTE — Progress Notes (Signed)
Cardiology Office Note:    Date:  01/24/2020   ID:  PRICILA Gross, DOB [birth year], MRN [id number]  PCP:  Marin Olp, MD  Cardiologist:  Sherren Mocha, MD  Electrophysiologist:  None   Referring MD: Marin Olp, MD   Chief Complaint  Patient presents with   Coronary Artery Disease    History of Present Illness:    Jennifer Gross is a 79 y.o. female with a hx of coronary artery disease, presenting for follow-up evaluation.  She was last seen here January 20, 2019.  The patient initially presented in 2008 with an anterior wall MI treated with overlapping drug-eluting stents in the LAD and balloon angioplasty of the first diagonal branch.  She has not had any recurrent ischemic events and has done quite well from a cardiovascular perspective.  She was having some issues with labile blood pressure and reported some episodes of symptomatic hypotension with SBP in the 90's. This has improved with a focus on staying hydrated. Today, she denies symptoms of palpitations, chest pain, shortness of breath, orthopnea, PND, lower extremity edema, dizziness, or syncope.   Past Medical History:  Diagnosis Date   ALLERGIC RHINITIS 04/08/2008   Allergy    Basal cell carcinoma    back   CAD (coronary artery disease) 2008   status post anterior myocardial infarction, treated w/ a dru-eluting stent to the LAD   CAD, NATIVE VESSEL 11/10/2009   GERD (gastroesophageal reflux disease)    Hiatal hernia    HYPERLIPIDEMIA 03/29/2008   HYPERTENSION 03/29/2008   Myocardial infarction [facility])    2008   Osteopenia     Past Surgical History:  Procedure Laterality Date   ANTERIOR AND POSTERIOR REPAIR WITH SACROSPINOUS FIXATION N/A 07/20/2018   Procedure: ANTERIOR AND POSTERIOR REPAIR WITH SACROSPINOUS FIXATION;  Surgeon: Arvella Nigh, MD;  Location: [facility];  Service: Gynecology;  Laterality: N/A;   APPENDECTOMY     BASAL CELL CARCINOMA EXCISION     back   BLADDER SUSPENSION N/A  07/20/2018   Procedure: TRANSOBTURATOR Margarita Grizzle;  Surgeon: Arvella Nigh, MD;  Location: [facility];  Service: Gynecology;  Laterality: N/A;   LAPAROSCOPIC ASSISTED VAGINAL HYSTERECTOMY Bilateral    LAPAROSCOPIC VAGINAL HYSTERECTOMY WITH SALPINGO OOPHORECTOMY Bilateral 07/20/2018   Procedure: LAPAROSCOPIC ASSISTED VAGINAL HYSTERECTOMY WITH SALPINGO OOPHORECTOMY;  Surgeon: Arvella Nigh, MD;  Location: [facility];  Service: Gynecology;  Laterality: Bilateral;   Percutaneous transluminal coronary angioplasty and drug-eluting  09/2007   Stent placement in the left anterior descending   WISDOM TOOTH EXTRACTION      Current Medications: Current Meds  Medication Sig   acetaminophen (TYLENOL) 500 MG tablet Take 1,000 mg by mouth 2 (two) times daily as needed for moderate pain or headache.   ALENDRONATE SODIUM PO Take 150 mg by mouth every 30 (thirty) days.   aspirin 81 MG tablet Take 81 mg by mouth every evening.    atorvastatin (LIPITOR) 80 MG tablet TAKE 1 TABLET BY MOUTH ONCE DAILY OR AS DIRECTED   carvedilol (COREG) 25 MG tablet TAKE 1 TABLET BY MOUTH TWICE DAILY   diclofenac sodium (VOLTAREN) 1 % GEL Apply 2 g topically 3 (three) times daily as needed.   ezetimibe (ZETIA) 10 MG tablet Take 1 tablet by mouth once daily   famotidine (PEPCID) 20 MG tablet Take 1 tablet (20 mg total) by mouth 2 (two) times daily as needed for heartburn or indigestion.   hydrochlorothiazide (HYDRODIURIL) 25 MG tablet Take 1 tablet (25 mg total)  by mouth daily.   losartan (COZAAR) 100 MG tablet Take 1 tablet by mouth once daily   nitroGLYCERIN (NITROSTAT) 0.4 MG SL tablet Place 1 tablet (0.4 mg total) under the tongue every 5 (five) minutes as needed for chest pain.   TOLAK 4 % CREA SMARTSIG:1 Sparingly Topical Daily     Allergies:   Penicillins   Social History   Socioeconomic History   Marital status: Married    Spouse name: Not on file   Number of children: 4   Years of education: Not on file    Highest education level: Not on file  Occupational History   Occupation: retired  Tobacco Use   Smoking status: Never Smoker   Smokeless tobacco: Never Used  Substance and Sexual Activity   Alcohol use: No   Drug use: No   Sexual activity: Not on file  Other Topics Concern   Not on file  Social History Narrative   Married 1970. 2 living children. 4 children (lost one at age 60 drug related to heart attack and one at 27 to heart attack not drug related), 6 grandkids. 2 greatgrandkids with 1 on the way March 2016.       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, Writer, Forensic psychologist      Hobbies: cooking   Social Determinants of Radio broadcast assistant Strain:    Difficulty of Paying Living Expenses: Not on file  Food Insecurity:    Worried About Charity fundraiser in the Last Year: Not on file   [facility] in the Last Year: Not on file  Transportation Needs:    Film/video editor (Medical): Not on file   Lack of Transportation (Non-Medical): Not on file  Physical Activity:    Days of Exercise per Week: Not on file   Minutes of Exercise per Session: Not on file  Stress:    Feeling of Stress : Not on file  Social Connections:    Frequency of Communication with Friends and Family: Not on file   Frequency of Social Gatherings with Friends and Family: Not on file   Attends Religious Services: Not on file   Active Member of Clubs or Organizations: Not on file   Attends Archivist Meetings: Not on file   Marital Status: Not on file     Family History: The patient's family history includes CAD in her son; Coronary artery disease in her maternal uncle and mother; Heart disease in her brother; Lung cancer in her father. There is no history of Colon cancer, Esophageal cancer, Rectal cancer, or Stomach cancer.  ROS:   Please see the history of present illness.    All other systems reviewed and are negative.  EKGs/Labs/Other Studies  Reviewed:    EKG:  EKG is ordered today.  The ekg ordered today demonstrates NSR 64 bpm, within normal limits  Recent Labs: 11/10/2019: ALT 14; BUN 23; Creatinine, Ser 0.95; Hemoglobin 13.3; Platelets 231.0; Potassium 4.2; Sodium 141  Recent Lipid Panel    Component Value Date/Time   CHOL 138 11/10/2019 1002   TRIG 76.0 11/10/2019 1002   HDL 52.80 11/10/2019 1002   CHOLHDL 3 11/10/2019 1002   VLDL 15.2 11/10/2019 1002   LDLCALC 70 11/10/2019 1002   LDLDIRECT 57.0 11/11/2018 1051    Physical Exam:    VS:  BP (!) 142/82    Pulse 67    Ht 5\' 5"  (1.651 m)    Wt 152 lb  6.4 oz (69.1 kg)    SpO2 96%    BMI 25.36 kg/m     Wt Readings from Last 3 Encounters:  01/24/20 152 lb 6.4 oz (69.1 kg)  11/10/19 152 lb (68.9 kg)  04/27/19 149 lb (67.6 kg)     GEN:  Well nourished, well developed in no acute distress HEENT: Normal NECK: No JVD; No carotid bruits LYMPHATICS: No lymphadenopathy CARDIAC: RRR, no murmurs, rubs, gallops RESPIRATORY:  Clear to auscultation without rales, wheezing or rhonchi  ABDOMEN: Soft, non-tender, non-distended MUSCULOSKELETAL:  No edema; No deformity  SKIN: Warm and dry NEUROLOGIC:  Alert and oriented x 3 PSYCHIATRIC:  Normal affect   ASSESSMENT:    1. Coronary artery disease involving native coronary artery of native heart without angina pectoris   2. Essential hypertension   3. Mixed hyperlipidemia    PLAN:    In order of problems listed above:  1. The patient is stable on her current medical program.  No changes are made today.  She will continue on aspirin, atorvastatin, and carvedilol. 2. Blood pressure is controlled on carvedilol, hydrochlorothiazide, and losartan.  Episodic hypotension has resolved as she has focused more on staying hydrated with water intake.  She has a history of mild whitecoat hypertension and also notices that her blood pressure is up when she is anxious.  Overall I think her blood pressure control is adequate at this  time. 3. Treated with atorvastatin 80 mg daily.  Lipids are in range with a cholesterol of 138, HDL 53, LDL 70, triglycerides 76 mg/dL.  Continue same therapy.  Transaminases have been normal.   Medication Adjustments/Labs and Tests Ordered: Current medicines are reviewed at length with the patient today.  Concerns regarding medicines are outlined above.  Orders Placed This Encounter  Procedures   EKG 12-Lead   No orders of the defined types were placed in this encounter.   Patient Instructions  Medication Instructions:  Your provider recommends that you continue on your current medications as directed. Please refer to the Current Medication list given to you today.    *If you need a refill on your cardiac medications before your next appointment, please call your pharmacy*  Follow-Up: At [facility], you and your health needs are our priority.  As part of our continuing mission to provide you with exceptional heart care, we have created designated Provider Care Teams.  These Care Teams include your primary Cardiologist (physician) and Advanced Practice Providers (APPs -  Physician Assistants and Nurse Practitioners) who all work together to provide you with the care you need, when you need it. Your next appointment:   12 month(s) The format for your next appointment:   In Person Provider:   You may see Sherren Mocha, MD or one of the following Advanced Practice Providers on your designated Care Team:    Richardson Dopp, PA-C  Vin Denton, PA-C  Daune Perch, [place]    Signed, Sherren Mocha, MD  01/24/2020 12:58 PM    [place]

## 2020-01-24 NOTE — Patient Instructions (Signed)

## 2020-02-01 DIAGNOSIS — L819 Disorder of pigmentation, unspecified: Secondary | ICD-10-CM | POA: Diagnosis not present

## 2020-02-01 DIAGNOSIS — L570 Actinic keratosis: Secondary | ICD-10-CM | POA: Diagnosis not present

## 2020-02-20 ENCOUNTER — Other Ambulatory Visit: Payer: Self-pay | Admitting: Cardiology

## 2020-02-20 ENCOUNTER — Other Ambulatory Visit: Payer: Self-pay | Admitting: Cardiovascular Disease

## 2020-03-09 ENCOUNTER — Ambulatory Visit: Payer: Medicare HMO | Admitting: Cardiovascular Disease

## 2020-05-09 NOTE — Patient Instructions (Addendum)
Please stop by lab before you go If you have mychart- we will send your results within 3 business days of [place] receiving them.  If you do not have mychart- we will call you about results within 5 business days of [place] receiving them.   Great to see you today!    Recommended follow up: Return in about 6 months (around 11/10/2020) for physical or sooner if needed.

## 2020-05-09 NOTE — Progress Notes (Signed)
Phone [phone] In person visit   Subjective:   Jennifer Gross is a 79 y.o. year old very pleasant female patient who presents for/with See problem oriented charting Chief Complaint  Patient presents with  . Hypertension   This visit occurred during the SARS-CoV-2 public health emergency.  Safety protocols were in place, including screening questions prior to the visit, additional usage of staff PPE, and extensive cleaning of exam room while observing appropriate contact time as indicated for disinfecting solutions.   Past Medical History-  Patient Active Problem List   Diagnosis Date Noted  . CAD (coronary artery disease) 11/10/2009    Priority: High  . Osteoporosis     Priority: Medium  . Hyperlipidemia 03/29/2008    Priority: Medium  . Essential hypertension 03/29/2008    Priority: Medium  . History of basal cell carcinoma of skin     Priority: Low  . GERD 05/05/2008    Priority: Low  . Allergic rhinitis 04/08/2008    Priority: Low  . Left hip pain 02/10/2019  . Left shoulder pain 02/10/2019  . S/P laparoscopic assisted vaginal hysterectomy (LAVH) 07/20/2018  . Preop cardiovascular exam 06/29/2018  . Chronic pain of right knee 10/12/2017    Medications- reviewed and updated Current Outpatient Medications  Medication Sig Dispense Refill  . acetaminophen (TYLENOL) 500 MG tablet Take 1,000 mg by mouth 2 (two) times daily as needed for moderate pain or headache.    . ALENDRONATE SODIUM PO Take 150 mg by mouth every 30 (thirty) days.    Marland Kitchen aspirin 81 MG tablet Take 81 mg by mouth every evening.     Marland Kitchen atorvastatin (LIPITOR) 80 MG tablet TAKE 1 TABLET BY MOUTH ONCE DAILY OR AS DIRECTED 90 tablet 3  . carvedilol (COREG) 25 MG tablet TAKE 1 TABLET BY MOUTH TWICE DAILY 180 tablet 3  . diclofenac sodium (VOLTAREN) 1 % GEL Apply 2 g topically 3 (three) times daily as needed. 100 g 5  . ezetimibe (ZETIA) 10 MG tablet Take 1 tablet by mouth once daily 90 tablet 3  . famotidine  (PEPCID) 20 MG tablet Take 1 tablet (20 mg total) by mouth 2 (two) times daily as needed for heartburn or indigestion. 180 tablet 3  . hydrochlorothiazide (HYDRODIURIL) 25 MG tablet Take 1 tablet (25 mg total) by mouth daily. 90 tablet 3  . losartan (COZAAR) 100 MG tablet Take 1 tablet by mouth once daily 90 tablet 3  . nitroGLYCERIN (NITROSTAT) 0.4 MG SL tablet Place 1 tablet (0.4 mg total) under the tongue every 5 (five) minutes as needed for chest pain. 25 tablet 2  . TOLAK 4 % CREA SMARTSIG:1 Sparingly Topical Daily     No current facility-administered medications for this visit.     Objective:  BP (!) 144/86   Pulse (!) 57   Temp 97.9 F (36.6 C) (Temporal)   Ht 5\' 5"  (1.651 m)   Wt 146 lb (66.2 kg)   SpO2 99%   BMI 24.30 kg/m  Gen: NAD, resting comfortably CV: RRR no murmurs rubs or gallops Lungs: CTAB no crackles, wheeze, rhonchi Ext: no edema Skin: warm, dry    Assessment and Plan   #CAD/hyperlipidemia- Sees Dr. Burt Knack S: Medication: atorvastatin 80mg , Zetia 10mg .  Aspirin 81 mg.  Nitroglycerin available as needed  In regards to CAD-no chest pain or shortness of breath- hasnt had to use nitro.    A/P: Coronary artery disease is asymptomatic-continue current medication  For hyperlipidemia-her cholesterol looked excellent last  visit-we will recheck next visit  #hypertension-blood pressure tends to run higher in office S: medication: HCTZ 25mg , Losartan 100mg , Carvedilol 25mg  Home readings #s:  at home has been between 128-130 and under 90. A/P: Blood pressure mildly elevated in office today.  On recheck remains slightly elevated-we have verified her home cuff in office before.  This is likely whitecoat syndrome with hypertension-given good control at home we will continue current medications -last GFR 56- she is working on hydration with BUN/Cr ratio >20:1- hopefully improved with labs today  # GERD S: Medication: Pepcid 20mg  has been taking 1 time a day as needed.  Does not need every day.  A/P: well controlled- continue current medicines   # Hyperglycemia/insulin resistance/prediabetes-peak A1c 6.24 October 2019 S:  Medication: none Exercise and diet- weight down 6 lbs from last visit. Decreased food intake. Doing some body weight exercises and walking.   A/P: hopefully improved- update a1c   % Osteoporosis-managed by Dr. Radene Knee [facility].  Patient is on alendronate.  She is compliant with calcium and vitamin D 1000 units- we will update vitamin D    Recommended follow up: Return in about 6 months (around 11/10/2020) for physical or sooner if needed.  Lab/Order associations:   ICD-10-CM   1. Essential hypertension  I10 Comprehensive metabolic panel  2. Coronary artery disease involving native coronary artery of native heart without angina pectoris  I25.10   3. Gastroesophageal reflux disease without esophagitis  K21.9   4. Hyperlipidemia, unspecified hyperlipidemia type  E78.5   5. Hyperglycemia  R73.9 Hemoglobin A1c  6. Age-related osteoporosis without current pathological fracture  M81.0 VITAMIN D 25 Hydroxy (Vit-D Deficiency, Fractures)    Meds ordered this encounter  Medications  . atorvastatin (LIPITOR) 80 MG tablet    Sig: TAKE 1 TABLET BY MOUTH ONCE DAILY OR AS DIRECTED    Dispense:  90 tablet    Refill:  3  . hydrochlorothiazide (HYDRODIURIL) 25 MG tablet    Sig: Take 1 tablet (25 mg total) by mouth daily.    Dispense:  90 tablet    Refill:  3  . nitroGLYCERIN (NITROSTAT) 0.4 MG SL tablet    Sig: Place 1 tablet (0.4 mg total) under the tongue every 5 (five) minutes as needed for chest pain.    Dispense:  25 tablet    Refill:  2   Return precautions advised.  Garret Reddish, MD

## 2020-05-10 ENCOUNTER — Other Ambulatory Visit: Payer: Self-pay

## 2020-05-10 ENCOUNTER — Ambulatory Visit (INDEPENDENT_AMBULATORY_CARE_PROVIDER_SITE_OTHER): Payer: Medicare HMO | Admitting: Family Medicine

## 2020-05-10 ENCOUNTER — Encounter: Payer: Self-pay | Admitting: Family Medicine

## 2020-05-10 VITALS — BP 144/86 | HR 57 | Temp 97.9°F | Ht 65.0 in | Wt 146.0 lb

## 2020-05-10 DIAGNOSIS — K219 Gastro-esophageal reflux disease without esophagitis: Secondary | ICD-10-CM

## 2020-05-10 DIAGNOSIS — I10 Essential (primary) hypertension: Secondary | ICD-10-CM

## 2020-05-10 DIAGNOSIS — E785 Hyperlipidemia, unspecified: Secondary | ICD-10-CM | POA: Diagnosis not present

## 2020-05-10 DIAGNOSIS — M810 Age-related osteoporosis without current pathological fracture: Secondary | ICD-10-CM | POA: Diagnosis not present

## 2020-05-10 DIAGNOSIS — I2510 Atherosclerotic heart disease of native coronary artery without angina pectoris: Secondary | ICD-10-CM

## 2020-05-10 DIAGNOSIS — R739 Hyperglycemia, unspecified: Secondary | ICD-10-CM | POA: Diagnosis not present

## 2020-05-10 LAB — COMPREHENSIVE METABOLIC PANEL
ALT: 13 U/L (ref 0–35)
AST: 18 U/L (ref 0–37)
Albumin: 4.1 g/dL (ref 3.5–5.2)
Alkaline Phosphatase: 82 U/L (ref 39–117)
BUN: 17 mg/dL (ref 6–23)
CO2: 28 mEq/L (ref 19–32)
Calcium: 9.3 mg/dL (ref 8.4–10.5)
Chloride: 106 mEq/L (ref 96–112)
Creatinine, Ser: 0.96 mg/dL (ref 0.40–1.20)
GFR: 55.96 mL/min — ABNORMAL LOW (ref >60.00)
Glucose, Bld: 99 mg/dL (ref 70–99)
Potassium: 4.5 mEq/L (ref 3.5–5.1)
Sodium: 141 mEq/L (ref 135–145)
Total Bilirubin: 0.6 mg/dL (ref 0.2–1.2)
Total Protein: 6.5 g/dL (ref 6.0–8.3)

## 2020-05-10 LAB — HEMOGLOBIN A1C: Hgb A1c MFr Bld: 6.2 % (ref 4.6–6.5)

## 2020-05-10 LAB — VITAMIN D 25 HYDROXY (VIT D DEFICIENCY, FRACTURES): VITD: 46.88 ng/mL (ref 30.00–100.00)

## 2020-05-10 MED ORDER — HYDROCHLOROTHIAZIDE 25 MG PO TABS: 25.0000 mg | ORAL_TABLET | Freq: Every day | ORAL | 3 refills | Status: DC

## 2020-05-10 MED ORDER — ATORVASTATIN CALCIUM 80 MG PO TABS: ORAL_TABLET | ORAL | 3 refills | Status: DC

## 2020-05-10 MED ORDER — NITROGLYCERIN 0.4 MG SL SUBL: 0.4000 mg | SUBLINGUAL_TABLET | SUBLINGUAL | 2 refills | Status: DC | PRN

## 2020-05-11 ENCOUNTER — Encounter: Payer: Self-pay | Admitting: Family Medicine

## 2020-05-11 DIAGNOSIS — N183 Chronic kidney disease, stage 3 unspecified: Secondary | ICD-10-CM | POA: Insufficient documentation

## 2020-05-11 DIAGNOSIS — R739 Hyperglycemia, unspecified: Secondary | ICD-10-CM | POA: Insufficient documentation

## 2020-05-30 DIAGNOSIS — Z1231 Encounter for screening mammogram for malignant neoplasm of breast: Secondary | ICD-10-CM | POA: Diagnosis not present

## 2020-05-30 DIAGNOSIS — Z01419 Encounter for gynecological examination (general) (routine) without abnormal findings: Secondary | ICD-10-CM | POA: Diagnosis not present

## 2020-05-30 DIAGNOSIS — M816 Localized osteoporosis [Lequesne]: Secondary | ICD-10-CM | POA: Diagnosis not present

## 2020-05-30 DIAGNOSIS — Z6824 Body mass index (BMI) 24.0-24.9, adult: Secondary | ICD-10-CM | POA: Diagnosis not present

## 2020-05-31 DIAGNOSIS — Z85828 Personal history of other malignant neoplasm of skin: Secondary | ICD-10-CM | POA: Diagnosis not present

## 2020-05-31 DIAGNOSIS — L570 Actinic keratosis: Secondary | ICD-10-CM | POA: Diagnosis not present

## 2020-05-31 DIAGNOSIS — L819 Disorder of pigmentation, unspecified: Secondary | ICD-10-CM | POA: Diagnosis not present

## 2020-05-31 DIAGNOSIS — L905 Scar conditions and fibrosis of skin: Secondary | ICD-10-CM | POA: Diagnosis not present

## 2020-10-12 DIAGNOSIS — H52203 Unspecified astigmatism, bilateral: Secondary | ICD-10-CM | POA: Diagnosis not present

## 2020-10-12 DIAGNOSIS — H2513 Age-related nuclear cataract, bilateral: Secondary | ICD-10-CM | POA: Diagnosis not present

## 2020-10-12 DIAGNOSIS — H5203 Hypermetropia, bilateral: Secondary | ICD-10-CM | POA: Diagnosis not present

## 2020-11-02 ENCOUNTER — Telehealth: Payer: Self-pay | Admitting: Family Medicine

## 2020-11-02 NOTE — Progress Notes (Signed)
  Chronic Care Management   Note  11/02/2020 Name: JADELIN Gross MRN: [id number] DOB: 1941/07/19  Jennifer Gross is a 79 y.o. year old female who is a primary care patient of Yong Channel, Brayton Mars, MD. I reached out to Beaulah Corin by phone today in response to a referral sent by [name] PCP, Marin Olp, MD.   [name] was given information about Chronic Care Management services today including:  1. CCM service includes personalized support from designated clinical staff supervised by her physician, including individualized plan of care and coordination with other care providers 2. 24/7 contact phone numbers for assistance for urgent and routine care needs. 3. Service will only be billed when office clinical staff spend 20 minutes or more in a month to coordinate care. 4. Only one practitioner may furnish and bill the service in a calendar month. 5. The patient may stop CCM services at any time (effective at the end of the month) by phone call to the office staff.   Patient wishes to consider information provided and/or speak with a member of the care team before deciding about enrollment in care management services.   Follow up plan:   [name] Upstream Scheduler

## 2020-11-02 NOTE — Progress Notes (Signed)
  Chronic Care Management   Outreach Note  11/02/2020 Name: Jennifer Gross MRN: [id number] DOB: 1940-12-25  Referred by: Marin Olp, MD Reason for referral : No chief complaint on file.   An unsuccessful telephone outreach was attempted today. The patient was referred to the pharmacist for assistance with care management and care coordination.   Follow Up Plan:   [name] Upstream Scheduler
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
## 2020-11-13 NOTE — Patient Instructions (Addendum)
Please stop by lab before you go If you have mychart- we will send your results within 3 business days of [place] receiving them.  If you do not have mychart- we will call you about results within 5 business days of [place] receiving them.  *please note we are currently using [facility] which has a longer processing time than [facility] typically so labs may not come back as quickly as in the past *please also note that you will see labs on mychart as soon as they post. I will later go in and write notes on them- will say "notes from Dr. Yong Channel"  Health Maintenance Due  Topic Date Due  . INFLUENZA VACCINE In office flu shot today 07/23/2020

## 2020-11-14 ENCOUNTER — Other Ambulatory Visit: Payer: Self-pay

## 2020-11-14 ENCOUNTER — Encounter: Payer: Self-pay | Admitting: Family Medicine

## 2020-11-14 ENCOUNTER — Ambulatory Visit (INDEPENDENT_AMBULATORY_CARE_PROVIDER_SITE_OTHER): Payer: Medicare HMO | Admitting: Family Medicine

## 2020-11-14 VITALS — BP 124/80 | HR 63 | Temp 97.7°F | Resp 18 | Ht 65.0 in | Wt 144.8 lb

## 2020-11-14 DIAGNOSIS — E785 Hyperlipidemia, unspecified: Secondary | ICD-10-CM

## 2020-11-14 DIAGNOSIS — Z0000 Encounter for general adult medical examination without abnormal findings: Secondary | ICD-10-CM

## 2020-11-14 DIAGNOSIS — I10 Essential (primary) hypertension: Secondary | ICD-10-CM | POA: Diagnosis not present

## 2020-11-14 DIAGNOSIS — K219 Gastro-esophageal reflux disease without esophagitis: Secondary | ICD-10-CM

## 2020-11-14 DIAGNOSIS — R739 Hyperglycemia, unspecified: Secondary | ICD-10-CM

## 2020-11-14 NOTE — Assessment & Plan Note (Signed)
S: medication: Losartan 100mg , Carvedilol 25mg  BID -hctz stopped 2021 due to lightheadedness and lower BPs into 90s   BP Readings from Last 3 Encounters:  11/14/20 124/80  05/10/20 (!) 144/86  01/24/20 (!) 142/82   A/P: Blood pressure looks great today despite stopping her thiazide.  Continue losartan 100 mg and carvedilol 25 mg twice daily

## 2020-11-15 LAB — HEMOGLOBIN A1C
Hgb A1c MFr Bld: 5.7 % of total Hgb — ABNORMAL HIGH (ref <5.7)
Mean Plasma Glucose: 117 (calc)
eAG (mmol/L): 6.5 (calc)

## 2020-11-15 LAB — COMPLETE METABOLIC PANEL WITH GFR
AG Ratio: 1.6 (calc) (ref 1.0–2.5)
ALT: 15 U/L (ref 6–29)
AST: 18 U/L (ref 10–35)
Albumin: 4 g/dL (ref 3.6–5.1)
Alkaline phosphatase (APISO): 81 U/L (ref 37–153)
BUN: 16 mg/dL (ref 7–25)
CO2: 30 mmol/L (ref 20–32)
Calcium: 9.5 mg/dL (ref 8.6–10.4)
Chloride: 106 mmol/L (ref 98–110)
Creat: 0.88 mg/dL (ref 0.60–0.88)
GFR, Est African American: 72 mL/min/{1.73_m2} (ref >=60)
GFR, Est Non African American: 62 mL/min/{1.73_m2} (ref >=60)
Globulin: 2.5 g/dL (calc) (ref 1.9–3.7)
Glucose, Bld: 87 mg/dL (ref 65–99)
Potassium: 4 mmol/L (ref 3.5–5.3)
Sodium: 142 mmol/L (ref 135–146)
Total Bilirubin: 0.4 mg/dL (ref 0.2–1.2)
Total Protein: 6.5 g/dL (ref 6.1–8.1)

## 2020-11-15 LAB — CBC WITH DIFFERENTIAL/PLATELET
Absolute Monocytes: 808 cells/uL (ref 200–950)
Basophils Absolute: 28 cells/uL (ref 0–200)
Basophils Relative: 0.3 %
Eosinophils Absolute: 226 cells/uL (ref 15–500)
Eosinophils Relative: 2.4 %
HCT: 36.7 % (ref 35.0–45.0)
Hemoglobin: 12 g/dL (ref 11.7–15.5)
Lymphs Abs: 2547 cells/uL (ref 850–3900)
MCH: 30.1 pg (ref 27.0–33.0)
MCHC: 32.7 g/dL (ref 32.0–36.0)
MCV: 92 fL (ref 80.0–100.0)
MPV: 11.3 fL (ref 7.5–12.5)
Monocytes Relative: 8.6 %
Neutro Abs: 5790 cells/uL (ref 1500–7800)
Neutrophils Relative %: 61.6 %
Platelets: 228 10*3/uL (ref 140–400)
RBC: 3.99 10*6/uL (ref 3.80–5.10)
RDW: 12.9 % (ref 11.0–15.0)
Total Lymphocyte: 27.1 %
WBC: 9.4 10*3/uL (ref 3.8–10.8)

## 2020-11-15 LAB — LIPID PANEL W/REFLEX DIRECT LDL
Cholesterol: 126 mg/dL (ref <200)
HDL: 55 mg/dL (ref >=50)
LDL Cholesterol (Calc): 53 mg/dL (calc)
Non-HDL Cholesterol (Calc): 71 mg/dL (calc) (ref <130)
Total CHOL/HDL Ratio: 2.3 (calc) (ref <5.0)
Triglycerides: 99 mg/dL (ref <150)

## 2020-11-17 ENCOUNTER — Other Ambulatory Visit: Payer: Self-pay | Admitting: Cardiovascular Disease

## 2020-12-08 ENCOUNTER — Telehealth: Payer: Self-pay | Admitting: Family Medicine

## 2020-12-08 NOTE — Progress Notes (Signed)
  Chronic Care Management   Outreach Note  12/08/2020 Name: AEISHA MINARIK MRN: [id number] DOB: 1941/04/23  Referred by: Marin Olp, MD Reason for referral : No chief complaint on file.   A second unsuccessful telephone outreach was attempted today. The patient was referred to pharmacist for assistance with care management and care coordination.  Follow Up Plan:   [name] Upstream Scheduler

## 2020-12-20 ENCOUNTER — Telehealth: Payer: Self-pay | Admitting: Family Medicine

## 2020-12-20 NOTE — Telephone Encounter (Signed)
Pt will call back to schedule Medicare Annual Wellness Visit (AWV) either virtually OR in office.   Last AWV 11/10/19; please schedule at anytime with [facility] Advisor at [facility].  This should be a 45 minute visit.

## 2021-01-05 ENCOUNTER — Telehealth: Payer: Self-pay | Admitting: Family Medicine

## 2021-01-05 NOTE — Progress Notes (Signed)
  Chronic Care Management   Note  01/05/2021 Name: DONNI OGLESBY MRN: [id number] DOB: Sep 02, 1941  DIERA WIRKKALA is a 80 y.o. year old female who is a primary care patient of Yong Channel, Brayton Mars, MD. I reached out to Beaulah Corin by phone today in response to a referral sent by [name] PCP, Marin Olp, MD.   Ms. Mccolm was given information about Chronic Care Management services today including:  1. CCM service includes personalized support from designated clinical staff supervised by her physician, including individualized plan of care and coordination with other care providers 2. 24/7 contact phone numbers for assistance for urgent and routine care needs. 3. Service will only be billed when office clinical staff spend 20 minutes or more in a month to coordinate care. 4. Only one practitioner may furnish and bill the service in a calendar month. 5. The patient may stop CCM services at any time (effective at the end of the month) by phone call to the office staff.   Patient did not agree to enrollment in care management services and does not wish to consider at this time.  Follow up plan:   SIGNATURE

## 2021-02-12 NOTE — Progress Notes (Signed)
Phone [phone] In person visit   Subjective:   Jennifer Gross is a 80 y.o. year old very pleasant female patient who presents for/with See problem oriented charting Chief Complaint  Patient presents with   Blood Pressure    Patient states that Friday night her bp dropped, when she Pt states that she was standing in the kitchen and she passed out. Her husband caught her so she didn't hit the ground.   Dehydration    Patient feels dehydrated .   This visit occurred during the SARS-CoV-2 public health emergency.  Safety protocols were in place, including screening questions prior to the visit, additional usage of staff PPE, and extensive cleaning of exam room while observing appropriate contact time as indicated for disinfecting solutions.   Past Medical History-  Patient Active Problem List   Diagnosis Date Noted   CAD (coronary artery disease) 11/10/2009    Priority: High   Hyperglycemia, unspecified 05/11/2020    Priority: Medium   Osteoporosis     Priority: Medium   Hyperlipidemia 03/29/2008    Priority: Medium   Essential hypertension 03/29/2008    Priority: Medium   History of basal cell carcinoma of skin     Priority: Low   GERD 05/05/2008    Priority: Low   Allergic rhinitis 04/08/2008    Priority: Low   CKD (chronic kidney disease), stage III ([place]) 05/11/2020   Left hip pain 02/10/2019   Left shoulder pain 02/10/2019   S/P laparoscopic assisted vaginal hysterectomy (LAVH) 07/20/2018   Preop cardiovascular exam 06/29/2018   Chronic pain of right knee 10/12/2017    Medications- reviewed and updated Current Outpatient Medications  Medication Sig Dispense Refill   acetaminophen (TYLENOL) 500 MG tablet Take 1,000 mg by mouth 2 (two) times daily as needed for moderate pain or headache.     aspirin 81 MG tablet Take 81 mg by mouth every evening.     atorvastatin (LIPITOR) 80 MG tablet TAKE 1 TABLET BY MOUTH ONCE DAILY OR AS DIRECTED 90 tablet 3    diclofenac sodium (VOLTAREN) 1 % GEL Apply 2 g topically 3 (three) times daily as needed. 100 g 5   ezetimibe (ZETIA) 10 MG tablet Take 1 tablet by mouth once daily 90 tablet 3   famotidine (PEPCID) 20 MG tablet Take 1 tablet (20 mg total) by mouth 2 (two) times daily as needed for heartburn or indigestion. 180 tablet 3   ibandronate (BONIVA) 150 MG tablet Take 150 mg by mouth every 30 (thirty) days.     nitroGLYCERIN (NITROSTAT) 0.4 MG SL tablet Place 1 tablet (0.4 mg total) under the tongue every 5 (five) minutes as needed for chest pain. 25 tablet 2   TOLAK 4 % CREA SMARTSIG:1 Sparingly Topical Daily     losartan (COZAAR) 100 MG tablet Take 1 tablet by mouth once daily (Patient not taking: Reported on 02/13/2021) 90 tablet 3   No current facility-administered medications for this visit.     Objective:  BP (!) 146/72    Pulse (!) 53    Temp 97.7 F (36.5 C) (Temporal)    Ht 5\' 5"  (1.651 m)    Wt 141 lb 12.8 oz (64.3 kg)    SpO2 98%    BMI 23.60 kg/m  Gen: NAD, resting comfortably CV: Slightly bradycardic no murmurs rubs or gallops Lungs: CTAB no crackles, wheeze, rhonchi Abdomen: soft/nontender/nondistended/normal bowel sounds.  Ext: no edema Skin: warm, dry  Neuro: CN II-XII intact, sensation and reflexes normal  throughout, 5/5 muscle strength in bilateral upper and lower extremities. Normal finger to nose. Normal rapid alternating movements. No pronator drift. Normal romberg. Normal gait.    EKG: sinus rhythm with rate 54, normal axis, normal intervals, no hypertrophy, no significant st or t wave changes- t wave inverted in v2 isolated but has been flat or equivocal in the past     Assessment and Plan   #hypertension/orthostatic hypotension-blood pressure tends to run higher in office S: medication: Losartan 100mg , Carvedilol 25mg  typically (off right now)  She had noted at home for several days last week blood pressure down in 90s over 50s or 60s. Around 10 PM was  watching tv on Friday night- got up from couch to get reflux pill (felt like on verge of indigestion)- got the pill and the next thing she knew she woke up on floor. Husband caught her and eased her down to the floor. EKG was done and largely reassuring and orthostatics were done and BP 104/60 sitting and 102/60 standing- HR as low as 54- do not have comparison #s for sit vs. Stand. No clear prodromal episode but had felt off for several days anyway prior to that.   -hctz stopped 2021 due to lightheadedness  - she has not taken any BP meds since Thursday night.   - blood pressures at home 120s and and 130s over 60s or 70s. She feels the best in 130s.   Reflecting back was probably dehydrated- she has been pushing fluids since that time and feels better. That whole day on Friday only had 2 cups of coffee. No chest pain or shortness of breath with episode. Is doing a much better job forcing herself to drink- doing a tleast 32 oz a day.   No facial or extremity weakness. No slurred words or trouble swallowing. no blurry vision or double vision. No paresthesias. No confusion or word finding difficulties. No incontinence or tongue biting A/P: Strongly suspect orthostatic hypotension as cause of syncopal episode-patient's blood pressure was already running low last week and she reported doing particularly poorly on hydration day of episode -Increase hydration to at least 40 ounces a day-it honestly sounds like she may have been under 20 ounces and has only been doing 32 ounces over the last few days and has felt better already -Heart rate today in low 50s even without carvedilol-we will discontinue for now.  She also reports feeling cold more than normal-check TSH -We are going to hold losartan unless home blood pressure readings get above 140 regularly.  Her blood pressure is mildly elevated in office today at 146 but home readings have been excellent and we have verified her home cuff in the past and she  has had high readings in office regardless-we will lean more heavily on home readings -Follow-up in 2 weeks -If she has new or worsening symptoms should contact us immediately or seek care -Episode could have been related to bradycardia as well-see above on holding carvedilol -Neurological exam reassuring today-do not think needs stroke work-up and no clear stroke symptoms -No signs of seizure   #CAD/hyperlipidemia- Sees Dr. Burt Knack S: Medication: atorvastatin 80mg , Zetia 10mg .  Aspirin 81 mg.  Nitroglycerin available as needed  In regards to CAD-as above no chest pain or shortness of breath Lab Results  Component Value Date   CHOL 126 11/14/2020   HDL 55 11/14/2020   LDLCALC 53 11/14/2020   LDLDIRECT 57.0 11/11/2018   TRIG 99 11/14/2020   CHOLHDL 2.3 11/14/2020  A/P: For syncopal episode -I doubt CAD related-no chest pain or shortness of breath preceding or occurring afterwards.  She will continue atorvastatin, Zetia, aspirin.  Lipids have been well controlled-continue atorvastatin  Recommended follow up: Return in about 2 weeks (around 02/27/2021). Future Appointments  Date Time Provider [place]  03/05/2021  1:00 PM Marin Olp, MD [facility]  03/21/2021  2:45 PM Richardson Dopp T, PA-C [facility]  05/15/2021  1:20 PM Yong Channel Brayton Mars, MD [facility]   Lab/Order associations:   ICD-10-CM   1. Essential hypertension  I10 CBC with Differential/Platelet    Comprehensive metabolic panel    TSH  2. Orthostatic hypotension  I95.1   3. Hyperlipidemia, unspecified hyperlipidemia type  E78.5   4. Coronary artery disease involving native coronary artery of native heart without angina pectoris  I25.10     No orders of the defined types were placed in this encounter.    Return precautions advised.  Garret Reddish, MD

## 2021-02-13 ENCOUNTER — Ambulatory Visit (INDEPENDENT_AMBULATORY_CARE_PROVIDER_SITE_OTHER): Payer: Medicare HMO | Admitting: Family Medicine

## 2021-02-13 ENCOUNTER — Encounter: Payer: Self-pay | Admitting: Family Medicine

## 2021-02-13 ENCOUNTER — Other Ambulatory Visit: Payer: Self-pay

## 2021-02-13 VITALS — BP 146/72 | HR 53 | Temp 97.7°F | Ht 65.0 in | Wt 141.8 lb

## 2021-02-13 DIAGNOSIS — I951 Orthostatic hypotension: Secondary | ICD-10-CM | POA: Diagnosis not present

## 2021-02-13 DIAGNOSIS — E785 Hyperlipidemia, unspecified: Secondary | ICD-10-CM | POA: Diagnosis not present

## 2021-02-13 DIAGNOSIS — I10 Essential (primary) hypertension: Secondary | ICD-10-CM | POA: Diagnosis not present

## 2021-02-13 DIAGNOSIS — I2510 Atherosclerotic heart disease of native coronary artery without angina pectoris: Secondary | ICD-10-CM | POA: Diagnosis not present

## 2021-02-13 LAB — COMPREHENSIVE METABOLIC PANEL
ALT: 18 U/L (ref 0–35)
AST: 22 U/L (ref 0–37)
Albumin: 4.2 g/dL (ref 3.5–5.2)
Alkaline Phosphatase: 71 U/L (ref 39–117)
BUN: 12 mg/dL (ref 6–23)
CO2: 28 mEq/L (ref 19–32)
Calcium: 9.3 mg/dL (ref 8.4–10.5)
Chloride: 105 mEq/L (ref 96–112)
Creatinine, Ser: 0.86 mg/dL (ref 0.40–1.20)
GFR: 63.79 mL/min (ref >60.00)
Glucose, Bld: 92 mg/dL (ref 70–99)
Potassium: 3.9 mEq/L (ref 3.5–5.1)
Sodium: 141 mEq/L (ref 135–145)
Total Bilirubin: 0.6 mg/dL (ref 0.2–1.2)
Total Protein: 7.1 g/dL (ref 6.0–8.3)

## 2021-02-13 LAB — CBC WITH DIFFERENTIAL/PLATELET
Basophils Absolute: 0 10*3/uL (ref 0.0–0.1)
Basophils Relative: 0.5 % (ref 0.0–3.0)
Eosinophils Absolute: 0.1 10*3/uL (ref 0.0–0.7)
Eosinophils Relative: 1.5 % (ref 0.0–5.0)
HCT: 38.2 % (ref 36.0–46.0)
Hemoglobin: 13.1 g/dL (ref 12.0–15.0)
Lymphocytes Relative: 24.3 % (ref 12.0–46.0)
Lymphs Abs: 2.1 10*3/uL (ref 0.7–4.0)
MCHC: 34.3 g/dL (ref 30.0–36.0)
MCV: 91.9 fl (ref 78.0–100.0)
Monocytes Absolute: 0.6 10*3/uL (ref 0.1–1.0)
Monocytes Relative: 7 % (ref 3.0–12.0)
Neutro Abs: 5.8 10*3/uL (ref 1.4–7.7)
Neutrophils Relative %: 66.7 % (ref 43.0–77.0)
Platelets: 239 10*3/uL (ref 150.0–400.0)
RBC: 4.15 Mil/uL (ref 3.87–5.11)
RDW: 14.2 % (ref 11.5–15.5)
WBC: 8.7 10*3/uL (ref 4.0–10.5)

## 2021-02-13 LAB — TSH: TSH: 2.69 u[IU]/mL (ref 0.35–4.50)

## 2021-02-13 NOTE — Patient Instructions (Addendum)
Stay well hydrated- I like at least 40 oz a day for a week  If recurrent issues- let us know immediately  Lets hold the losartan and carvedilol for now  If home blood pressure gets above 140 on average- take half of a losartan but also let us know if you have to do that. Continue to monitor blood pressure daily.   Please stop by lab before you go If you have mychart- we will send your results within 3 business days of [place] receiving them.  If you do not have mychart- we will call you about results within 5 business days of [place] receiving them.  *please also note that you will see labs on mychart as soon as they post. I will later go in and write notes on them- will say "notes from Dr. Yong Channel"  Recommended follow up: Return in about 2 weeks (around 02/27/2021). For front desk- can use same day slot if needed

## 2021-03-04 NOTE — Progress Notes (Signed)
Phone [phone] In person visit   Subjective:   Jennifer Gross is a 80 y.o. year old very pleasant female patient who presents for/with See problem oriented charting Chief Complaint  Patient presents with  . Hypertension  . Medication Refill    Zetia ([employer])    This visit occurred during the SARS-CoV-2 public health emergency.  Safety protocols were in place, including screening questions prior to the visit, additional usage of staff PPE, and extensive cleaning of exam room while observing appropriate contact time as indicated for disinfecting solutions.   Past Medical History-  Patient Active Problem List   Diagnosis Date Noted  . CAD (coronary artery disease) 11/10/2009    Priority: High  . Hyperglycemia, unspecified 05/11/2020    Priority: Medium  . Osteoporosis     Priority: Medium  . Hyperlipidemia 03/29/2008    Priority: Medium  . Essential hypertension 03/29/2008    Priority: Medium  . History of basal cell carcinoma of skin     Priority: Low  . GERD 05/05/2008    Priority: Low  . Allergic rhinitis 04/08/2008    Priority: Low  . CKD (chronic kidney disease), stage III ([place]) 05/11/2020  . Left hip pain 02/10/2019  . Left shoulder pain 02/10/2019  . S/P laparoscopic assisted vaginal hysterectomy (LAVH) 07/20/2018  . Preop cardiovascular exam 06/29/2018  . Chronic pain of right knee 10/12/2017    Medications- reviewed and updated Current Outpatient Medications  Medication Sig Dispense Refill  . acetaminophen (TYLENOL) 500 MG tablet Take 1,000 mg by mouth 2 (two) times daily as needed for moderate pain or headache.    Marland Kitchen aspirin 81 MG tablet Take 81 mg by mouth every evening.    Marland Kitchen atorvastatin (LIPITOR) 80 MG tablet TAKE 1 TABLET BY MOUTH ONCE DAILY OR AS DIRECTED 90 tablet 3  . diclofenac sodium (VOLTAREN) 1 % GEL Apply 2 g topically 3 (three) times daily as needed. 100 g 5  . famotidine (PEPCID) 20 MG tablet Take 1 tablet (20 mg total) by mouth 2 (two)  times daily as needed for heartburn or indigestion. 180 tablet 3  . ibandronate (BONIVA) 150 MG tablet Take 150 mg by mouth every 30 (thirty) days.    Marland Kitchen losartan (COZAAR) 25 MG tablet Take 1 tablet (25 mg total) by mouth daily. 90 tablet 3  . nitroGLYCERIN (NITROSTAT) 0.4 MG SL tablet Place 1 tablet (0.4 mg total) under the tongue every 5 (five) minutes as needed for chest pain. 25 tablet 2  . TOLAK 4 % CREA SMARTSIG:1 Sparingly Topical Daily    . ezetimibe (ZETIA) 10 MG tablet Take 1 tablet (10 mg total) by mouth daily. 90 tablet 3   No current facility-administered medications for this visit.     Objective:  BP 139/81 Comment: most recent home reading  Pulse 90   Temp 98.4 F (36.9 C) (Temporal)   Ht 5\' 5"  (1.651 m)   Wt 142 lb (64.4 kg)   SpO2 95%   BMI 23.63 kg/m  Gen: NAD, resting comfortably CV: RRR no murmurs rubs or gallops Lungs: CTAB no crackles, wheeze, rhonchi Ext: no edema Skin: warm, dry    Assessment and Plan   #CAD/hyperlipidemia- Sees Dr. Burt Knack S: Medication: atorvastatin 80mg , Zetia 10mg .  Aspirin 81 mg.  Nitroglycerin available as needed  In regards to CAD-no chest pain or shortness of breath  Lab Results  Component Value Date   CHOL 126 11/14/2020   HDL 55 11/14/2020   LDLCALC 53  11/14/2020   LDLDIRECT 57.0 11/11/2018   TRIG 99 11/14/2020   CHOLHDL 2.3 11/14/2020  A/P: CAD is asymptomatic.  Cholesterols been well controlled-we did refill Zetia for her today.  Continue atorvastatin 80 mg and aspirin 81 mg as well  #hypertension-blood pressure tends to run higher in office S: medication: Losartan 100mg , Carvedilol 25mg  -hctz stopped 2021 due to lightheadedness Home readings #s: before she got sick her #s were anywhere from 118-141/60s-70s. HR at lowest 54 off carvedilol. When she was ill last week had several #s inn 150 since recoering Bps went back down to 120s for most part.   On Saturday BP 128/77 then up to 150/78 and tok a full losartan and BP  down to 105-110 but thankfully did not feel poorly. Before bed 121/66. Since then 126/76, 132/76 and 139/81 without medicine BP Readings from Last 3 Encounters:  03/05/21 (!) 180/82--> 139/81 home reading entered  02/13/21 (!) 146/72  11/14/20 124/80  A/P: Blood pressure thankfully at least not low off of losartan but when she took another dose of losartan 100 mg if your blood pressure is under 10 5-1 10 range despite being on carvedilol.  We opted to stop losartan 100 mg and start losartan 25 mg before bed.  We will try to keep her blood pressure between 099 and 833 systolic with average under 140 in between 82-50 diastolic as long as not orthostatic and not having any more episodes like last visit.  Obviously if she has recurrent syncopal episode will need further work-up.  # cough/congestion S: Patient had a cough/congestion starting Sunday a week ago approximately 8 days.  Tested negative on home covid test. Feeling much better at this point.  A/P: Thankfully has recovered from her illness.  Technically per new CDC guidelines she would be outside of self-isolation.  Even if positive given full resolution of symptoms past day 5.  I think repeat testing at this time would be low yield  Recommended follow up:  Keep next visit Future Appointments  Date Time Provider [place]  03/21/2021  2:45 PM Liliane Shi, [place] [facility]  05/15/2021  1:20 PM Yong Channel Brayton Mars, MD [facility]    Lab/Order associations:   ICD-10-CM   1. Essential hypertension  I10   2. Hyperlipidemia, unspecified hyperlipidemia type  E78.5   3. Coronary artery disease involving native coronary artery of native heart without angina pectoris  I25.10     Meds ordered this encounter  Medications  . losartan (COZAAR) 25 MG tablet    Sig: Take 1 tablet (25 mg total) by mouth daily.    Dispense:  90 tablet    Refill:  3  . ezetimibe (ZETIA) 10 MG tablet    Sig: Take 1 tablet (10 mg total) by  mouth daily.    Dispense:  90 tablet    Refill:  3    Return precautions advised.  Garret Reddish, MD

## 2021-03-04 NOTE — Patient Instructions (Addendum)
Lets stop losartan 100 mg  Start taking losartan 25 mg before bed  Stay off carvedilol   I want to know if you consistently have readings above 145 at home or above 90. I also want to know if BP is <105-110/65 or if you feel poorly or any more significant lightheaded episodes  If you get you get the prevnar 20 please let us know- this is now recommended per new guidelines hot off the press  Great job with the fluids!   See you in may or sooner if needed

## 2021-03-05 ENCOUNTER — Encounter: Payer: Self-pay | Admitting: Family Medicine

## 2021-03-05 ENCOUNTER — Ambulatory Visit (INDEPENDENT_AMBULATORY_CARE_PROVIDER_SITE_OTHER): Payer: Medicare HMO | Admitting: Family Medicine

## 2021-03-05 ENCOUNTER — Other Ambulatory Visit: Payer: Self-pay

## 2021-03-05 VITALS — BP 139/81 | HR 90 | Temp 98.4°F | Ht 65.0 in | Wt 142.0 lb

## 2021-03-05 DIAGNOSIS — E785 Hyperlipidemia, unspecified: Secondary | ICD-10-CM | POA: Diagnosis not present

## 2021-03-05 DIAGNOSIS — I2510 Atherosclerotic heart disease of native coronary artery without angina pectoris: Secondary | ICD-10-CM | POA: Diagnosis not present

## 2021-03-05 DIAGNOSIS — I10 Essential (primary) hypertension: Secondary | ICD-10-CM

## 2021-03-05 MED ORDER — LOSARTAN POTASSIUM 25 MG PO TABS: 25.0000 mg | ORAL_TABLET | Freq: Every day | ORAL | 3 refills | Status: DC

## 2021-03-05 MED ORDER — EZETIMIBE 10 MG PO TABS: 10.0000 mg | ORAL_TABLET | Freq: Every day | ORAL | 3 refills | Status: DC

## 2021-03-14 NOTE — Progress Notes (Signed)
[facility] AND [place]                                     Cardiology Office Note:    Date:  03/15/2021   ID:  Jennifer Gross, DOB 11/13/1941, MRN [id number]  PCP:  Marin Olp, MD  [facility] Cardiologist:  Sherren Mocha, MD  [facility] Electrophysiologist:  None   Referring MD: Marin Olp, MD   Follow-up on CAD, hypertension, and hyperlipidemia  History of Present Illness:    Jennifer Gross is a 80 y.o. female with a hx of coronary artery disease status post remote anterior wall MI in 2008, hypertension, GERD, osteoporosis, CKD, and hyperlipidemia . She was treated with overlapping drug-eluting stents in the LAD and balloon angioplasty of the first diagonal branch for anterior wall MI.   She was last seen by Dr. Burt Knack on 01/24/2020 and was doing well from a cardiac standpoint. No changes were made at that visit. Her lipid panel was checked on 11/14/2020 and were within goal with LDL 53. She saw her primary care provider on 02/13/21. At that time, she had a syncopal episode. She was feeling dizzy, lightheaded, and was hypotensive. Her losartan and carvedilol were stopped due to hypotension and bradycardia. She had a follow-up on 3/14, she did not have any more syncopal episodes. Her blood pressure was elevated, therefore her losartan was restarted at 25 mg. She has her TSH, CMP, and CBC checked on 02/13/21 and they were all normal.   She presents today for a follow-up. She is doing well physically. She reports no syncopal events. She reports she has been checking her blood pressure at home with it being in the 130-150 SBP. She reports no chest pain, dizziness, or palpitations. No PND or orthopnea. No LE edema. No blood in urine in the stool.  Past Medical History:  Diagnosis Date  . ALLERGIC RHINITIS 04/08/2008  . Allergy   . Basal cell carcinoma    back  . CAD (coronary artery disease) 2008   status post anterior myocardial  infarction, treated w/ a dru-eluting stent to the LAD  . CAD, NATIVE VESSEL 11/10/2009  . GERD (gastroesophageal reflux disease)   . Hiatal hernia   . HYPERLIPIDEMIA 03/29/2008  . HYPERTENSION 03/29/2008  . Myocardial infarction ([place])    2008  . Osteopenia     Past Surgical History:  Procedure Laterality Date  . ANTERIOR AND POSTERIOR REPAIR WITH SACROSPINOUS FIXATION N/A 07/20/2018   Procedure: ANTERIOR AND POSTERIOR REPAIR WITH SACROSPINOUS FIXATION;  Surgeon: Arvella Nigh, MD;  Location: [facility];  Service: Gynecology;  Laterality: N/A;  . APPENDECTOMY    . BASAL CELL CARCINOMA EXCISION     back  . BLADDER SUSPENSION N/A 07/20/2018   Procedure: TRANSOBTURATOR Margarita Grizzle;  Surgeon: Arvella Nigh, MD;  Location: [facility];  Service: Gynecology;  Laterality: N/A;  . LAPAROSCOPIC ASSISTED VAGINAL HYSTERECTOMY Bilateral   . LAPAROSCOPIC VAGINAL HYSTERECTOMY WITH SALPINGO OOPHORECTOMY Bilateral 07/20/2018   Procedure: LAPAROSCOPIC ASSISTED VAGINAL HYSTERECTOMY WITH SALPINGO OOPHORECTOMY;  Surgeon: Arvella Nigh, MD;  Location: [facility];  Service: Gynecology;  Laterality: Bilateral;  . Percutaneous transluminal coronary angioplasty and drug-eluting  09/2007   Stent placement in the left anterior descending  . WISDOM TOOTH EXTRACTION      Current Medications: Current Meds  Medication Sig  . acetaminophen (TYLENOL) 500 MG tablet  Take 1,000 mg by mouth 2 (two) times daily as needed for moderate pain or headache.  Marland Kitchen aspirin 81 MG tablet Take 81 mg by mouth every evening.  Marland Kitchen atorvastatin (LIPITOR) 80 MG tablet TAKE 1 TABLET BY MOUTH ONCE DAILY OR AS DIRECTED  . diclofenac sodium (VOLTAREN) 1 % GEL Apply 2 g topically 3 (three) times daily as needed.  . ezetimibe (ZETIA) 10 MG tablet Take 1 tablet (10 mg total) by mouth daily.  . famotidine (PEPCID) 20 MG tablet Take 1 tablet (20 mg total) by mouth 2 (two) times daily as needed for heartburn or indigestion.  . ibandronate (BONIVA) 150 MG tablet Take 150  mg by mouth every 30 (thirty) days.  Marland Kitchen losartan (COZAAR) 50 MG tablet Take 1 tablet (50 mg total) by mouth daily.  . nitroGLYCERIN (NITROSTAT) 0.4 MG SL tablet Place 1 tablet (0.4 mg total) under the tongue every 5 (five) minutes as needed for chest pain.  . TOLAK 4 % CREA SMARTSIG:1 Sparingly Topical Daily  . [DISCONTINUED] losartan (COZAAR) 25 MG tablet Take 1 tablet (25 mg total) by mouth daily.     Allergies:   Penicillins   Social History   Socioeconomic History  . Marital status: Married    Spouse name: Not on file  . Number of children: 4  . Years of education: Not on file  . Highest education level: Not on file  Occupational History  . Occupation: retired  Tobacco Use  . Smoking status: Never Smoker  . Smokeless tobacco: Never Used  Vaping Use  . Vaping Use: Never used  Substance and Sexual Activity  . Alcohol use: No  . Drug use: No  . Sexual activity: Not on file  Other Topics Concern  . Not on file  Social History Narrative   Married 1970. 2 living children. 4 children (lost one at age 51 drug related to heart attack and one at 4 to heart attack not drug related), 6 grandkids. 2 greatgrandkids with 1 on the way March 2016.       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 There is no history of Colon cancer, Esophageal cancer, Rectal cancer, or Stomach cancer.  ROS:   Please see the history of present illness.    All other systems reviewed and are negative.  EKGs/Labs/Other Studies Reviewed:    The following  studies were reviewed today  EKG:  EKG is ordered today.  The ekg ordered today demonstrates sinus rhythm with HR 77 with T waves inversion in V1 and V2 (no change from previous EKG).  Recent Labs: 02/13/2021: ALT 18; BUN 12; Creatinine, Ser 0.86; Hemoglobin 13.1; Platelets 239.0; Potassium 3.9; Sodium 141; TSH 2.69  Recent Lipid Panel    Component Value Date/Time   CHOL 126 11/14/2020 1543   TRIG 99 11/14/2020 1543   HDL 55 11/14/2020 1543   CHOLHDL 2.3 11/14/2020 1543   VLDL 15.2 11/10/2019 1002   LDLCALC 53 11/14/2020 1543   LDLDIRECT 57.0 11/11/2018  1051     Risk Assessment/Calculations:       Physical Exam:    VS:  BP (!) 172/88   Pulse 86   Ht 5\' 5"  (1.651 m)   Wt 141 lb (64 kg)   SpO2 98%   BMI 23.46 kg/m     Wt Readings from Last 3 Encounters:  03/15/21 141 lb (64 kg)  03/05/21 142 lb (64.4 kg)  02/13/21 141 lb 12.8 oz (64.3 kg)     GEN: Well nourished, well developed in no acute distress HEENT: Normal NECK: No JVD; No carotid bruits LYMPHATICS: No lymphadenopathy CARDIAC: RRR, no murmurs, rubs, gallops RESPIRATORY:  Clear to auscultation without rales, wheezing or rhonchi  ABDOMEN: Soft, non-tender, non-distended MUSCULOSKELETAL:  No edema; No deformity  SKIN: Warm and dry NEUROLOGIC:  Alert and oriented x 3 PSYCHIATRIC:  Normal affect   ASSESSMENT:    1. Coronary artery disease involving native coronary artery of native heart without angina pectoris   2. Essential hypertension   3. Mixed hyperlipidemia    PLAN:    In order of problems listed above:  CAD: She is asymptomatic. She has nitroglycerin prn and aspirin daily. Her carvedilol was stopped due to syncope and bradycardia by her PCP. Will continue to hold it at this time. Follow-up in one year with Dr.Cooper.  Hypertension: Blood pressure elevated at 168/80 on my personal recheck. Will increase her losartan to 50 mg. Instructed patient to keep a log of blood pressures and to call us if it  stays consistently above >150/90.  Hyperlipidemia: She is on atorvastatin 80mg  daily. Last lipid panel revealed LDL 53, HDL 55, cholesterol 126, and triglyceride 99.        Medication Adjustments/Labs and Tests Ordered: Current medicines are reviewed at length with the patient today.  Concerns regarding medicines are outlined above.  Orders Placed This Encounter  Procedures  . EKG 12-Lead   Meds ordered this encounter  Medications  . losartan (COZAAR) 50 MG tablet    Sig: Take 1 tablet (50 mg total) by mouth daily.    Dispense:  90 tablet    Refill:  3    Dose increase    Patient Instructions  Medication Instructions:  Your physician has recommended you make the following change in your medication:  1.) increase losartan to 50 mg daily  *If you need a refill on your cardiac medications before your next appointment, please call your pharmacy*   Lab Work: none If you have labs (blood work) drawn today and your tests are completely normal, you will receive your results only by: Marland Kitchen MyChart Message (if you have MyChart) OR . A paper copy in the mail If you have any lab test that is abnormal or we need to change your treatment, we will call you to review the results.   Testing/Procedures: none   Follow-Up: At [facility], you and your health needs are our priority.  As part of our continuing mission to provide you with exceptional heart care, we have created designated Provider Care Teams.  These Care Teams include your primary Cardiologist (physician) and Advanced Practice Providers (APPs -  Physician Assistants and Nurse Practitioners) who all work together to provide you with the care you need, when you need it.   Your next appointment:   12 month(s)  The format for your next appointment:   In Person  Provider:   You may see Sherren Mocha, MD or one of the following Advanced Practice Providers on  your designated Care Team:    Richardson Dopp, PA-C  Robbie Lis,  PA-C   Other Instructions      Signed, Ramiro Harvest, RN  03/15/2021 4:05 PM    [facility]

## 2021-03-15 ENCOUNTER — Other Ambulatory Visit: Payer: Self-pay

## 2021-03-15 ENCOUNTER — Encounter: Payer: Self-pay | Admitting: Physician Assistant

## 2021-03-15 ENCOUNTER — Ambulatory Visit: Payer: Medicare HMO | Admitting: Physician Assistant

## 2021-03-15 VITALS — BP 172/88 | HR 86 | Ht 65.0 in | Wt 141.0 lb

## 2021-03-15 DIAGNOSIS — I10 Essential (primary) hypertension: Secondary | ICD-10-CM | POA: Diagnosis not present

## 2021-03-15 DIAGNOSIS — E782 Mixed hyperlipidemia: Secondary | ICD-10-CM | POA: Diagnosis not present

## 2021-03-15 DIAGNOSIS — I2510 Atherosclerotic heart disease of native coronary artery without angina pectoris: Secondary | ICD-10-CM

## 2021-03-15 MED ORDER — LOSARTAN POTASSIUM 50 MG PO TABS: 50.0000 mg | ORAL_TABLET | Freq: Every day | ORAL | 3 refills | Status: DC

## 2021-03-15 NOTE — Patient Instructions (Signed)
Medication Instructions:  Your physician has recommended you make the following change in your medication:  1.) increase losartan to 50 mg daily  *If you need a refill on your cardiac medications before your next appointment, please call your pharmacy*   Lab Work: none If you have labs (blood work) drawn today and your tests are completely normal, you will receive your results only by: Marland Kitchen MyChart Message (if you have MyChart) OR . A paper copy in the mail If you have any lab test that is abnormal or we need to change your treatment, we will call you to review the results.   Testing/Procedures: none   Follow-Up: At [facility], you and your health needs are our priority.  As part of our continuing mission to provide you with exceptional heart care, we have created designated Provider Care Teams.  These Care Teams include your primary Cardiologist (physician) and Advanced Practice Providers (APPs -  Physician Assistants and Nurse Practitioners) who all work together to provide you with the care you need, when you need it.   Your next appointment:   12 month(s)  The format for your next appointment:   In Person  Provider:   You may see Sherren Mocha, MD or one of the following Advanced Practice Providers on your designated Care Team:    Richardson Dopp, PA-C  Robbie Lis, [place]   Other Instructions

## 2021-03-21 ENCOUNTER — Ambulatory Visit: Payer: Medicare HMO | Admitting: Physician Assistant

## 2021-04-02 DIAGNOSIS — R519 Headache, unspecified: Secondary | ICD-10-CM | POA: Diagnosis not present

## 2021-04-02 DIAGNOSIS — Z20828 Contact with and (suspected) exposure to other viral communicable diseases: Secondary | ICD-10-CM | POA: Diagnosis not present

## 2021-04-02 DIAGNOSIS — R051 Acute cough: Secondary | ICD-10-CM | POA: Diagnosis not present

## 2021-04-02 DIAGNOSIS — M791 Myalgia, unspecified site: Secondary | ICD-10-CM | POA: Diagnosis not present

## 2021-04-03 ENCOUNTER — Ambulatory Visit: Payer: Medicare HMO | Admitting: Physician Assistant

## 2021-04-18 DIAGNOSIS — L905 Scar conditions and fibrosis of skin: Secondary | ICD-10-CM | POA: Diagnosis not present

## 2021-04-18 DIAGNOSIS — L570 Actinic keratosis: Secondary | ICD-10-CM | POA: Diagnosis not present

## 2021-04-18 DIAGNOSIS — L821 Other seborrheic keratosis: Secondary | ICD-10-CM | POA: Diagnosis not present

## 2021-04-18 DIAGNOSIS — L814 Other melanin hyperpigmentation: Secondary | ICD-10-CM | POA: Diagnosis not present

## 2021-04-18 DIAGNOSIS — D229 Melanocytic nevi, unspecified: Secondary | ICD-10-CM | POA: Diagnosis not present

## 2021-04-18 DIAGNOSIS — D1801 Hemangioma of skin and subcutaneous tissue: Secondary | ICD-10-CM | POA: Diagnosis not present

## 2021-04-18 DIAGNOSIS — L819 Disorder of pigmentation, unspecified: Secondary | ICD-10-CM | POA: Diagnosis not present

## 2021-04-18 DIAGNOSIS — Z85828 Personal history of other malignant neoplasm of skin: Secondary | ICD-10-CM | POA: Diagnosis not present

## 2021-05-15 ENCOUNTER — Encounter: Payer: Self-pay | Admitting: Family Medicine

## 2021-05-15 ENCOUNTER — Other Ambulatory Visit: Payer: Self-pay

## 2021-05-15 ENCOUNTER — Ambulatory Visit (INDEPENDENT_AMBULATORY_CARE_PROVIDER_SITE_OTHER): Payer: Medicare HMO | Admitting: Family Medicine

## 2021-05-15 VITALS — BP 127/74 | HR 90 | Temp 97.7°F | Ht 65.0 in | Wt 139.0 lb

## 2021-05-15 DIAGNOSIS — I10 Essential (primary) hypertension: Secondary | ICD-10-CM | POA: Diagnosis not present

## 2021-05-15 DIAGNOSIS — E785 Hyperlipidemia, unspecified: Secondary | ICD-10-CM | POA: Diagnosis not present

## 2021-05-15 DIAGNOSIS — I2510 Atherosclerotic heart disease of native coronary artery without angina pectoris: Secondary | ICD-10-CM

## 2021-05-15 DIAGNOSIS — N183 Chronic kidney disease, stage 3 unspecified: Secondary | ICD-10-CM | POA: Diagnosis not present

## 2021-05-15 DIAGNOSIS — R739 Hyperglycemia, unspecified: Secondary | ICD-10-CM | POA: Diagnosis not present

## 2021-05-15 NOTE — Progress Notes (Signed)
Phone [phone]   Subjective:  Patient presents today for regular follow up. Chief complaint-See problem oriented charting-   The following were reviewed and entered/updated in epic: Past Medical History:  Diagnosis Date  . ALLERGIC RHINITIS 04/08/2008  . Allergy   . Basal cell carcinoma    back  . CAD (coronary artery disease) 2008   status post anterior myocardial infarction, treated w/ a dru-eluting stent to the LAD  . CAD, NATIVE VESSEL 11/10/2009  . GERD (gastroesophageal reflux disease)   . Hiatal hernia   . HYPERLIPIDEMIA 03/29/2008  . HYPERTENSION 03/29/2008  . Myocardial infarction ([place])    2008  . Osteopenia    Patient Active Problem List   Diagnosis Date Noted  . CAD (coronary artery disease) 11/10/2009    Priority: High  . Hyperglycemia, unspecified 05/11/2020    Priority: Medium  . CKD (chronic kidney disease), stage III ([place]) 05/11/2020    Priority: Medium  . Osteoporosis     Priority: Medium  . Hyperlipidemia 03/29/2008    Priority: Medium  . Essential hypertension 03/29/2008    Priority: Medium  . History of basal cell carcinoma of skin     Priority: Low  . GERD 05/05/2008    Priority: Low  . Allergic rhinitis 04/08/2008    Priority: Low  . Left hip pain 02/10/2019  . Left shoulder pain 02/10/2019  . S/P laparoscopic assisted vaginal hysterectomy (LAVH) 07/20/2018  . Chronic pain of right knee 10/12/2017   Past Surgical History:  Procedure Laterality Date  . ANTERIOR AND POSTERIOR REPAIR WITH SACROSPINOUS FIXATION N/A 07/20/2018   Procedure: ANTERIOR AND POSTERIOR REPAIR WITH SACROSPINOUS FIXATION;  Surgeon: Arvella Nigh, MD;  Location: [facility];  Service: Gynecology;  Laterality: N/A;  . APPENDECTOMY    . BASAL CELL CARCINOMA EXCISION     back  . BLADDER SUSPENSION N/A 07/20/2018   Procedure: TRANSOBTURATOR Margarita Grizzle;  Surgeon: Arvella Nigh, MD;  Location: [facility];  Service: Gynecology;  Laterality: N/A;  . LAPAROSCOPIC ASSISTED VAGINAL  HYSTERECTOMY Bilateral   . LAPAROSCOPIC VAGINAL HYSTERECTOMY WITH SALPINGO OOPHORECTOMY Bilateral 07/20/2018   Procedure: LAPAROSCOPIC ASSISTED VAGINAL HYSTERECTOMY WITH SALPINGO OOPHORECTOMY;  Surgeon: Arvella Nigh, MD;  Location: [facility];  Service: Gynecology;  Laterality: Bilateral;  . Percutaneous transluminal coronary angioplasty and drug-eluting  09/2007   Stent placement in the left anterior descending  . WISDOM TOOTH EXTRACTION      Family History  Problem Relation Age of Onset  . Coronary artery disease Mother   . Lung cancer Father   . Heart disease Brother        stent placed  . Coronary artery disease Maternal Uncle   . CAD Son        died 50 of MI  . Colon cancer Neg Hx   . Esophageal cancer Neg Hx   . Rectal cancer Neg Hx   . Stomach cancer Neg Hx     Medications- reviewed and updated Current Outpatient Medications  Medication Sig Dispense Refill  . acetaminophen (TYLENOL) 500 MG tablet Take 1,000 mg by mouth 2 (two) times daily as needed for moderate pain or headache.    Marland Kitchen aspirin 81 MG tablet Take 81 mg by mouth every evening.    Marland Kitchen atorvastatin (LIPITOR) 80 MG tablet TAKE 1 TABLET BY MOUTH ONCE DAILY OR AS DIRECTED 90 tablet 3  . diclofenac sodium (VOLTAREN) 1 % GEL Apply 2 g topically 3 (three) times daily as needed. 100 g 5  . ezetimibe (ZETIA) 10  MG tablet Take 1 tablet (10 mg total) by mouth daily. 90 tablet 3  . famotidine (PEPCID) 20 MG tablet Take 1 tablet (20 mg total) by mouth 2 (two) times daily as needed for heartburn or indigestion. 180 tablet 3  . ibandronate (BONIVA) 150 MG tablet Take 150 mg by mouth every 30 (thirty) days.    . nitroGLYCERIN (NITROSTAT) 0.4 MG SL tablet Place 1 tablet (0.4 mg total) under the tongue every 5 (five) minutes as needed for chest pain. 25 tablet 2   No current facility-administered medications for this visit.    Allergies-reviewed and updated Allergies  Allergen Reactions  . Penicillins Rash    Has patient had a PCN  reaction causing immediate rash, facial/tongue/throat swelling, SOB or lightheadedness with hypotension: Yes Has patient had a PCN reaction causing severe rash involving mucus membranes or skin necrosis: No Has patient had a PCN reaction that required hospitalization: No Has patient had a PCN reaction occurring within the last 10 years: No If all of the above answers are "NO", then may proceed with Cephalosporin use.     Social History   Social History Narrative   Married 1970. 2 living children. 4 children (lost one at age 69 drug related to heart attack and one at 34 to heart attack not drug related), 6 grandkids. 2 greatgrandkids with 1 on the way March 2016.       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 Oropharynx normal Neck: no thyromegaly CV: RRR no murmurs rubs or gallops Lungs: CTAB no crackles, wheeze, rhonchi Ext: no edema    Assessment and Plan   #CAD/hyperlipidemia- Sees Dr. Burt Knack S: Medication: atorvastatin 80mg , Zetia 10mg . Aspirin 81 mg. Nitroglycerin available as needed.   In regards to CAD- No chest pain or shortness of breath. No syncopal episodes or dizziness outside of episode mentioned under HTN A/P:Patient is asymptomatic-continue current medications-Will update physical in 6 months  #hypertension-blood pressure tends to run higher in office S: medication: Stopped taking Losartan after Easter secondary to Flu and BP getting very low- as low as 74 and had some tunnel vision- laid on ground and lifted legs and felt better - has not taken losartan since that time -hctz stopped 2021 due to lightheadedness, later stopped coreg 25 mg BID (bradycardia)  Home readings #s:  systolic ranges from 660-630 and diastolic 16-01. Average 120s/70s without any BP meds BP Readings from Last 3 Encounters:  05/15/21 127/74 last home reading  03/15/21 (!) 172/88  03/05/21 139/81  A/P: Stable without any medications-well controlled at home-continue checking blood pressure readings at home with blood pressure goal less than 135/85.  Continue without medication  # Hyperglycemia/insulin resistance/prediabetes-peak A1c 6.24 October 2019 S: Medication: None  Exercise-Completes yard work occasionally, but she is planning to walk more. Diet-Staying hydrated, Eating a low processed foods, vegetables and fruits diet.  Lab Results  Component Value Date   HGBA1C 5.7 (H) 11/14/2020   HGBA1C 6.2 05/10/2020   HGBA1C 6.1 11/10/2019  A/P: A1c well controlled on last visit-she has maintained weight since that time- she recently had blood work in  February and we opted to repeat A1c at next visit instead of performing daily  % Osteoporosis-managed by Dr. Radene Knee [facility]. Patient is on boniva monthly. She is compliant with calcium and vitamin D  GFR 56 November 2020- and occurred again with next labs -Last 2 GFR's have been above 60-we will continue to monitor-potentially need to remove CKD 3 from last  Recommended follow BD:HDIXBO in about 6 months (around 11/15/2021) for physical or sooner if needed. Future Appointments  Date Time Provider [place]  11/20/2021  1:20 PM Marin Olp, MD [facility]    Lab/Order associations:not fasting   ICD-10-CM   1. Coronary artery disease involving native coronary artery of native heart without angina pectoris  I25.10   2. Hyperlipidemia, unspecified hyperlipidemia type  E78.5   3. Hyperglycemia, unspecified  R73.9   4. Essential hypertension  I10    I,Harris Phan,acting as a scribe for Garret Reddish, MD.,have documented all relevant documentation on the behalf of Garret Reddish, MD,as directed by  Garret Reddish,  MD while in the presence of Garret Reddish, MD.   I, Garret Reddish, MD, have reviewed all documentation for this visit. The documentation on 05/15/21 for the exam, diagnosis, procedures, and orders are all accurate and complete.   Return precautions advised.  Garret Reddish, MD

## 2021-05-15 NOTE — Patient Instructions (Addendum)
Goal BP at home average <135/85- let us know if higher than that- may need to restart losartan low dose. We may even use just 12.5 mg but based on readings today I do not think you will need that  Lets stay off losartan for now  Recommended follow up: Return in about 6 months (around 11/15/2021) for physical or sooner if needed.

## 2021-05-23 ENCOUNTER — Telehealth: Payer: Self-pay

## 2021-05-23 ENCOUNTER — Other Ambulatory Visit: Payer: Self-pay

## 2021-05-23 MED ORDER — ATORVASTATIN CALCIUM 80 MG PO TABS: ORAL_TABLET | ORAL | 3 refills | Status: DC

## 2021-05-23 NOTE — Telephone Encounter (Signed)
MEDICATION:  atorvastatin (LIPITOR) 80 MG tablet  PHARMACY: [facility]  Comments:   **Let patient know to contact pharmacy at the end of the day to make sure medication is ready. **  ** Please notify patient to allow 48-72 hours to process**  **Encourage patient to contact the pharmacy for refills or they can request refills through [facility]**

## 2021-05-23 NOTE — Telephone Encounter (Signed)
Refill sent to pharmacy.   

## 2021-06-06 DIAGNOSIS — Z20828 Contact with and (suspected) exposure to other viral communicable diseases: Secondary | ICD-10-CM | POA: Diagnosis not present

## 2021-06-07 ENCOUNTER — Telehealth: Payer: Self-pay | Admitting: Family Medicine

## 2021-06-07 NOTE — Telephone Encounter (Signed)
Patient request a CB next week due to she has COVID

## 2021-06-20 ENCOUNTER — Telehealth: Payer: Self-pay | Admitting: Family Medicine

## 2021-06-20 NOTE — Telephone Encounter (Signed)
Left message for patient to schedule Annual Wellness Visit.  Please schedule with Nurse Health Advisor Charlott Rakes, RN at [facility].

## 2021-06-27 DIAGNOSIS — Z01419 Encounter for gynecological examination (general) (routine) without abnormal findings: Secondary | ICD-10-CM | POA: Diagnosis not present

## 2021-06-27 DIAGNOSIS — Z1231 Encounter for screening mammogram for malignant neoplasm of breast: Secondary | ICD-10-CM | POA: Diagnosis not present

## 2021-06-27 DIAGNOSIS — Z6823 Body mass index (BMI) 23.0-23.9, adult: Secondary | ICD-10-CM | POA: Diagnosis not present

## 2021-07-13 ENCOUNTER — Ambulatory Visit (INDEPENDENT_AMBULATORY_CARE_PROVIDER_SITE_OTHER): Payer: Medicare HMO

## 2021-07-13 VITALS — BP 129/75 | HR 57

## 2021-07-13 DIAGNOSIS — Z0000 Encounter for general adult medical examination without abnormal findings: Secondary | ICD-10-CM

## 2021-07-13 NOTE — Patient Instructions (Addendum)
Jennifer Gross , Thank you for taking time to come for your Medicare Wellness Visit. I appreciate your ongoing commitment to your health goals. Please review the following plan we discussed and let me know if I can assist you in the future.   Screening recommendations/referrals: Colonoscopy: No longer required  Mammogram: Done 05/19/19 repeat every year Bone Density: Done 05/08/15 repeat every 2 years  Recommended yearly ophthalmology/optometry visit for glaucoma screening and checkup Recommended yearly dental visit for hygiene and checkup  Vaccinations: Influenza vaccine: Due after 07/23/21 Pneumococcal vaccine: Completed  Tdap vaccine: Done 12/02/14 repeat in 10 years 12/02/24 Shingles vaccine: Completed 6/4 & 08/25/18   Covid-19:Completed 1/16, 2/6, 10/17/20 & 03/27/21  Advanced directives: Advance directive discussed with you today. Even though you declined this today please call our office should you change your mind and we can give you the proper paperwork for you to fill out.  Conditions/risks identified: None at this time   Next appointment: Follow up in one year for your annual wellness visit    Preventive Care 65 Years and Older, Female Preventive care refers to lifestyle choices and visits with your health care provider that can promote health and wellness. What does preventive care include? A yearly physical exam. This is also called an annual well check. Dental exams once or twice a year. Routine eye exams. Ask your health care provider how often you should have your eyes checked. Personal lifestyle choices, including: Daily care of your teeth and gums. Regular physical activity. Eating a healthy diet. Avoiding tobacco and drug use. Limiting alcohol use. Practicing safe sex. Taking low-dose aspirin every day. Taking vitamin and mineral supplements as recommended by your health care provider. What happens during an annual well check? The services and screenings done by your  health care provider during your annual well check will depend on your age, overall health, lifestyle risk factors, and family history of disease. Counseling  Your health care provider may ask you questions about your: Alcohol use. Tobacco use. Drug use. Emotional well-being. Home and relationship well-being. Sexual activity. Eating habits. History of falls. Memory and ability to understand (cognition). Work and work Statistician. Reproductive health. Screening  You may have the following tests or measurements: Height, weight, and BMI. Blood pressure. Lipid and cholesterol levels. These may be checked every 5 years, or more frequently if you are over 23 years old. Skin check. Lung cancer screening. You may have this screening every year starting at age 2 if you have a 30-pack-year history of smoking and currently smoke or have quit within the past 15 years. Fecal occult blood test (FOBT) of the stool. You may have this test every year starting at age 8. Flexible sigmoidoscopy or colonoscopy. You may have a sigmoidoscopy every 5 years or a colonoscopy every 10 years starting at age 53. Hepatitis C blood test. Hepatitis B blood test. Sexually transmitted disease (STD) testing. Diabetes screening. This is done by checking your blood sugar (glucose) after you have not eaten for a while (fasting). You may have this done every 1-3 years. Bone density scan. This is done to screen for osteoporosis. You may have this done starting at age 81. Mammogram. This may be done every 1-2 years. Talk to your health care provider about how often you should have regular mammograms. Talk with your health care provider about your test results, treatment options, and if necessary, the need for more tests. Vaccines  Your health care provider may recommend certain vaccines, such as: Influenza  vaccine. This is recommended every year. Tetanus, diphtheria, and acellular pertussis (Tdap, Td) vaccine. You may  need a Td booster every 10 years. Zoster vaccine. You may need this after age 32. Pneumococcal 13-valent conjugate (PCV13) vaccine. One dose is recommended after age 35. Pneumococcal polysaccharide (PPSV23) vaccine. One dose is recommended after age 56. Talk to your health care provider about which screenings and vaccines you need and how often you need them. This information is not intended to replace advice given to you by your health care provider. Make sure you discuss any questions you have with your health care provider. Document Released: 01/05/2016 Document Revised: 08/28/2016 Document Reviewed: 10/10/2015 Elsevier Interactive Patient Education  2017 [place] Prevention in the Home Falls can cause injuries. They can happen to people of all ages. There are many things you can do to make your home safe and to help prevent falls. What can I do on the outside of my home? Regularly fix the edges of walkways and driveways and fix any cracks. Remove anything that might make you trip as you walk through a door, such as a raised step or threshold. Trim any bushes or trees on the path to your home. Use bright outdoor lighting. Clear any walking paths of anything that might make someone trip, such as rocks or tools. Regularly check to see if handrails are loose or broken. Make sure that both sides of any steps have handrails. Any raised decks and porches should have guardrails on the edges. Have any leaves, snow, or ice cleared regularly. Use sand or salt on walking paths during winter. Clean up any spills in your garage right away. This includes oil or grease spills. What can I do in the bathroom? Use night lights. Install grab bars by the toilet and in the tub and shower. Do not use towel bars as grab bars. Use non-skid mats or decals in the tub or shower. If you need to sit down in the shower, use a plastic, non-slip stool. Keep the floor dry. Clean up any water that spills on  the floor as soon as it happens. Remove soap buildup in the tub or shower regularly. Attach bath mats securely with double-sided non-slip rug tape. Do not have throw rugs and other things on the floor that can make you trip. What can I do in the bedroom? Use night lights. Make sure that you have a light by your bed that is easy to reach. Do not use any sheets or blankets that are too big for your bed. They should not hang down onto the floor. Have a firm chair that has side arms. You can use this for support while you get dressed. Do not have throw rugs and other things on the floor that can make you trip. What can I do in the kitchen? Clean up any spills right away. Avoid walking on wet floors. Keep items that you use a lot in easy-to-reach places. If you need to reach something above you, use a strong step stool that has a grab bar. Keep electrical cords out of the way. Do not use floor polish or wax that makes floors slippery. If you must use wax, use non-skid floor wax. Do not have throw rugs and other things on the floor that can make you trip. What can I do with my stairs? Do not leave any items on the stairs. Make sure that there are handrails on both sides of the stairs and use them. Fix  handrails that are broken or loose. Make sure that handrails are as long as the stairways. Check any carpeting to make sure that it is firmly attached to the stairs. Fix any carpet that is loose or worn. Avoid having throw rugs at the top or bottom of the stairs. If you do have throw rugs, attach them to the floor with carpet tape. Make sure that you have a light switch at the top of the stairs and the bottom of the stairs. If you do not have them, ask someone to add them for you. What else can I do to help prevent falls? Wear shoes that: Do not have high heels. Have rubber bottoms. Are comfortable and fit you well. Are closed at the toe. Do not wear sandals. If you use a stepladder: Make sure  that it is fully opened. Do not climb a closed stepladder. Make sure that both sides of the stepladder are locked into place. Ask someone to hold it for you, if possible. Clearly mark and make sure that you can see: Any grab bars or handrails. First and last steps. Where the edge of each step is. Use tools that help you move around (mobility aids) if they are needed. These include: Canes. Walkers. Scooters. Crutches. Turn on the lights when you go into a dark area. Replace any light bulbs as soon as they burn out. Set up your furniture so you have a clear path. Avoid moving your furniture around. If any of your floors are uneven, fix them. If there are any pets around you, be aware of where they are. Review your medicines with your doctor. Some medicines can make you feel dizzy. This can increase your chance of falling. Ask your doctor what other things that you can do to help prevent falls. This information is not intended to replace advice given to you by your health care provider. Make sure you discuss any questions you have with your health care provider. Document Released: 10/05/2009 Document Revised: 05/16/2016 Document Reviewed: 01/13/2015 Elsevier Interactive Patient Education  2017 [employer].

## 2021-07-13 NOTE — Progress Notes (Signed)
Virtual Visit via Telephone Note  I connected with  Jennifer Gross on 07/13/21 at  8:45 AM EDT by telephone and verified that I am speaking with the correct person using two identifiers.  Medicare Annual Wellness visit completed telephonically due to Covid-19 pandemic.   Persons participating in this call: This Health Coach and this patient.   Location: Patient: Home Provider: Office   I discussed the limitations, risks, security and privacy concerns of performing an evaluation and management service by telephone and the availability of in person appointments. The patient expressed understanding and agreed to proceed.  Unable to perform video visit due to video visit attempted and failed and/or patient does not have video capability.   Some vital signs may be absent or patient reported.   Willette Brace, LPN   Subjective:   Jennifer Gross is a 80 y.o. female who presents for Medicare Annual (Subsequent) preventive examination.  Review of Systems     Cardiac Risk Factors include: advanced age (>17mn, >>61women);hypertension;dyslipidemia     Objective:    Today's Vitals   07/13/21 0855  BP: 129/75  Pulse: (!) 57   There is no height or weight on file to calculate BMI.  Advanced Directives 07/13/2021 07/20/2018 07/08/2018 06/17/2014  Does Patient Have a Medical Advance Directive? No No No Patient does not have advance directive  Does patient want to make changes to medical advance directive? - No - Patient declined - -  Would patient like information on creating a medical advance directive? No - Patient declined No - Patient declined No - Patient declined -    Current Medications (verified) Outpatient Encounter Medications as of 07/13/2021  Medication Sig   acetaminophen (TYLENOL) 500 MG tablet Take 1,000 mg by mouth 2 (two) times daily as needed for moderate pain or headache.   aspirin 81 MG tablet Take 81 mg by mouth every evening.   atorvastatin (LIPITOR) 80 MG tablet  TAKE 1 TABLET BY MOUTH ONCE DAILY OR AS DIRECTED   diclofenac sodium (VOLTAREN) 1 % GEL Apply 2 g topically 3 (three) times daily as needed.   ezetimibe (ZETIA) 10 MG tablet Take 1 tablet (10 mg total) by mouth daily.   famotidine (PEPCID) 20 MG tablet Take 1 tablet (20 mg total) by mouth 2 (two) times daily as needed for heartburn or indigestion.   ibandronate (BONIVA) 150 MG tablet Take 150 mg by mouth every 30 (thirty) days.   nitroGLYCERIN (NITROSTAT) 0.4 MG SL tablet Place 1 tablet (0.4 mg total) under the tongue every 5 (five) minutes as needed for chest pain.   No facility-administered encounter medications on file as of 07/13/2021.    Allergies (verified) Penicillins   History: Past Medical History:  Diagnosis Date   ALLERGIC RHINITIS 04/08/2008   Allergy    Basal cell carcinoma    back   CAD (coronary artery disease) 2008   status post anterior myocardial infarction, treated w/ a dru-eluting stent to the LAD   CAD, NATIVE VESSEL 11/10/2009   GERD (gastroesophageal reflux disease)    Hiatal hernia    HYPERLIPIDEMIA 03/29/2008   HYPERTENSION 03/29/2008   Myocardial infarction ([facility]    2008   Osteopenia    Past Surgical History:  Procedure Laterality Date   ANTERIOR AND POSTERIOR REPAIR WITH SACROSPINOUS FIXATION N/A 07/20/2018   Procedure: ANTERIOR AND POSTERIOR REPAIR WITH SACROSPINOUS FIXATION;  Surgeon: MArvella Nigh MD;  [facility];  Service: Gynecology;  Laterality: N/A;   APPENDECTOMY  BASAL CELL CARCINOMA EXCISION     back   BLADDER SUSPENSION N/A 07/20/2018   Procedure: TRANSOBTURATOR Margarita Grizzle;  Surgeon: Arvella Nigh, MD;  Location: [facility];  Service: Gynecology;  Laterality: N/A;   LAPAROSCOPIC ASSISTED VAGINAL HYSTERECTOMY Bilateral    LAPAROSCOPIC VAGINAL HYSTERECTOMY WITH SALPINGO OOPHORECTOMY Bilateral 07/20/2018   Procedure: LAPAROSCOPIC ASSISTED VAGINAL HYSTERECTOMY WITH SALPINGO OOPHORECTOMY;  Surgeon: Arvella Nigh, MD;  Location: [facility];  Service:  Gynecology;  Laterality: Bilateral;   Percutaneous transluminal coronary angioplasty and drug-eluting  09/2007   Stent placement in the left anterior descending   WISDOM TOOTH EXTRACTION     Family History  Problem Relation Age of Onset   Coronary artery disease Mother    Lung cancer Father    Heart disease Brother        stent placed   Coronary artery disease Maternal Uncle    CAD Son        died 49 of MI   Colon cancer Neg Hx    Esophageal cancer Neg Hx    Rectal cancer Neg Hx    Stomach cancer Neg Hx    Social History   Socioeconomic History   Marital status: Married    Spouse name: Not on file   Number of children: 4   Years of education: Not on file   Highest education level: Not on file  Occupational History   Occupation: retired  Tobacco Use   Smoking status: Never   Smokeless tobacco: Never  Vaping Use   Vaping Use: Never used  Substance and Sexual Activity   Alcohol use: No   Drug use: No   Sexual activity: Not on file  Other Topics Concern   Not on file  Social History Narrative   Married 1970. 2 living children. 4 children (lost one at age 58 drug related to heart attack and one at 6 to heart attack not drug related), 6 grandkids. 2 greatgrandkids with 1 on the way March 2016.       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, Writer, Forensic psychologist      Hobbies: cooking   Social Determinants of Radio broadcast assistant Strain: Low Risk    Difficulty of Paying Living Expenses: Not hard at all  Food Insecurity: No Food Insecurity   Worried About Charity fundraiser in the Last Year: Never true   Arboriculturist in the Last Year: Never true  Transportation Needs: No Transportation Needs   Lack of Transportation (Medical): No   Lack of Transportation (Non-Medical): No  Physical Activity: Insufficiently Active   Days of Exercise per Week: 3 days   Minutes of Exercise per Session: 30 min  Stress: No Stress Concern Present   Feeling of Stress : Not at all   Social Connections: Moderately Integrated   Frequency of Communication with Friends and Family: More than three times a week   Frequency of Social Gatherings with Friends and Family: More than three times a week   Attends Religious Services: More than 4 times per year   Active Member of [employer] or Organizations: No   Attends Music therapist: Never   Marital Status: Married    Tobacco Counseling Counseling given: Not Answered   Clinical Intake:  Pre-visit preparation completed: Yes  Pain : No/denies pain     BMI - recorded: 23.13 Nutritional Status: BMI of 19-24  Normal Nutritional Risks: None Diabetes: No  How often do you need to have  someone help you when you read instructions, pamphlets, or other written materials from your doctor or pharmacy?: 1 - Never  Diabetic?No  Interpreter Needed?: No  Information entered by :: Charlott Rakes, LPN   Activities of Daily Living In your present state of health, do you have any difficulty performing the following activities: 07/13/2021 05/15/2021  Hearing? N N  Vision? N N  Difficulty concentrating or making decisions? N N  Walking or climbing stairs? N N  Dressing or bathing? N N  Doing errands, shopping? N N  Preparing Food and eating ? N -  Using the Toilet? N -  In the past six months, have you accidently leaked urine? N -  Do you have problems with loss of bowel control? N -  Managing your Medications? N -  Managing your Finances? N -  Housekeeping or managing your Housekeeping? N -  Some recent data might be hidden    Patient Care Team: Marin Olp, MD as PCP - General (Family Medicine) Sherren Mocha, MD as PCP - Cardiology (Cardiology) Arvella Nigh, MD as Consulting Physician (Obstetrics and Gynecology)  Indicate any recent Medical Services you may have received from other than [facility] providers in the past year (date may be approximate).     Assessment:   This is a routine wellness  examination for [place].  Hearing/Vision screen Hearing Screening - Comments:: Pt stated mild loss  Vision Screening - Comments:: Pt follows up with [facility] opthalmology for annual eye exams   Dietary issues and exercise activities discussed: Current Exercise Habits: Home exercise routine, Type of exercise: walking, Time (Minutes): 30, Frequency (Times/Week): 3, Weekly Exercise (Minutes/Week): 90   Goals Addressed             This Visit's Progress    Patient Stated       None at this time        Depression Screen PHQ 2/9 Scores 07/13/2021 05/15/2021 02/13/2021 11/14/2020 11/10/2019 04/27/2019 05/11/2018  PHQ - 2 Score 0 0 0 0 0 0 0  PHQ- 9 Score - 0 - - - - -    Fall Risk Fall Risk  07/13/2021 05/15/2021 11/14/2020 11/10/2019 04/27/2019  Falls in the past year? 0 0 0 0 0  Number falls in past yr: 0 0 0 0 0  Injury with Fall? 0 0 0 0 0  Risk for fall due to : Impaired vision - - - -  Follow up Falls prevention discussed - - - -    FALL RISK PREVENTION PERTAINING TO THE HOME:  Any stairs in or around the home? Yes  If so, are there any without handrails? No  Home free of loose throw rugs in walkways, pet beds, electrical cords, etc? Yes  Adequate lighting in your home to reduce risk of falls? Yes   ASSISTIVE DEVICES UTILIZED TO PREVENT FALLS:  Life alert? No  Use of a cane, walker or w/c? No  Grab bars in the bathroom? No  Shower chair or bench in shower? No  Elevated toilet seat or a handicapped toilet? No   TIMED UP AND GO:  Was the test performed? No     Cognitive Function:     6CIT Screen 07/13/2021  What Year? 0 points  What month? 0 points  What time? 0 points  Count back from 20 0 points  Months in reverse 0 points  Repeat phrase 0 points  Total Score 0    Immunizations Immunization History  Administered Date(s) Administered  Fluad Quad(high Dose 65+) 08/25/2019, 11/16/2020   Influenza Split 09/27/2011, 10/13/2012   Influenza Whole 09/29/2008,  09/27/2009, 10/04/2010   Influenza, High Dose Seasonal PF 09/24/2013, 10/04/2014, 09/28/2015, 09/27/2016, 08/28/2017, 09/21/2018   PFIZER(Purple Top)SARS-COV-2 Vaccination 01/08/2020, 01/29/2020, 10/17/2020, 03/27/2021   Pneumococcal Conjugate-13 12/02/2014   Pneumococcal Polysaccharide-23 03/29/2008   Pneumococcal-Unspecified 03/05/2021   Td 03/29/2008   Tdap 12/02/2014   Zoster Recombinat (Shingrix) 05/26/2018, 08/25/2018   Zoster, Live 02/23/2008    TDAP status: Up to date  Flu Vaccine status: Up to date  Pneumococcal vaccine status: Up to date  Covid-19 vaccine status: Completed vaccines  Qualifies for Shingles Vaccine? Yes   Zostavax completed Yes   Shingrix Completed?: Yes  Screening Tests Health Maintenance  Topic Date Due   INFLUENZA VACCINE  07/23/2021   TETANUS/TDAP  12/02/2024   DEXA SCAN  Completed   COVID-19 Vaccine  Completed   PNA vac Low Risk Adult  Completed   Zoster Vaccines- Shingrix  Completed   HPV VACCINES  Aged Out    Health Maintenance  There are no preventive care reminders to display for this patient.  Colorectal cancer screening: No longer required.   Mammogram status: Completed 05/19/19. Repeat every year  Bone Density status: Completed 05/08/15. Results reflect: Bone density results: OSTEOPOROSIS. Repeat every 2 years.   Additional Screening:  Vision Screening: Recommended annual ophthalmology exams for early detection of glaucoma and other disorders of the eye. Is the patient up to date with their annual eye exam?  Yes  Who is the provider or what is the name of the office in which the patient attends annual eye exams? [facility]  If pt is not established with a provider, would they like to be referred to a provider to establish care? No .   Dental Screening: Recommended annual dental exams for proper oral hygiene  Community Resource Referral / Chronic Care Management: CRR required this visit?  No   CCM required this  visit?  No      Plan:     I have personally reviewed and noted the following in the patient's chart:   Medical and social history Use of alcohol, tobacco or illicit drugs  Current medications and supplements including opioid prescriptions.  Functional ability and status Nutritional status Physical activity Advanced directives List of other physicians Hospitalizations, surgeries, and ER visits in previous 12 months Vitals Screenings to include cognitive, depression, and falls Referrals and appointments  In addition, I have reviewed and discussed with patient certain preventive protocols, quality metrics, and best practice recommendations. A written personalized care plan for preventive services as well as general preventive health recommendations were provided to patient.     Willette Brace, LPN   [id number]   Nurse Notes: None

## 2021-09-18 ENCOUNTER — Encounter: Payer: Self-pay | Admitting: Family Medicine

## 2021-09-18 ENCOUNTER — Ambulatory Visit (INDEPENDENT_AMBULATORY_CARE_PROVIDER_SITE_OTHER): Payer: Medicare HMO

## 2021-09-18 ENCOUNTER — Other Ambulatory Visit: Payer: Self-pay

## 2021-09-18 DIAGNOSIS — Z23 Encounter for immunization: Secondary | ICD-10-CM

## 2021-10-10 DIAGNOSIS — Z85828 Personal history of other malignant neoplasm of skin: Secondary | ICD-10-CM | POA: Diagnosis not present

## 2021-10-10 DIAGNOSIS — L819 Disorder of pigmentation, unspecified: Secondary | ICD-10-CM | POA: Diagnosis not present

## 2021-10-10 DIAGNOSIS — Z08 Encounter for follow-up examination after completed treatment for malignant neoplasm: Secondary | ICD-10-CM | POA: Diagnosis not present

## 2021-10-10 DIAGNOSIS — L718 Other rosacea: Secondary | ICD-10-CM | POA: Diagnosis not present

## 2021-10-10 DIAGNOSIS — L570 Actinic keratosis: Secondary | ICD-10-CM | POA: Diagnosis not present

## 2021-11-20 ENCOUNTER — Encounter: Payer: Medicare HMO | Admitting: Family Medicine

## 2021-12-03 DIAGNOSIS — H524 Presbyopia: Secondary | ICD-10-CM | POA: Diagnosis not present

## 2021-12-03 DIAGNOSIS — H5203 Hypermetropia, bilateral: Secondary | ICD-10-CM | POA: Diagnosis not present

## 2021-12-03 DIAGNOSIS — H25013 Cortical age-related cataract, bilateral: Secondary | ICD-10-CM | POA: Diagnosis not present

## 2021-12-03 DIAGNOSIS — H16143 Punctate keratitis, bilateral: Secondary | ICD-10-CM | POA: Diagnosis not present

## 2021-12-03 DIAGNOSIS — H2513 Age-related nuclear cataract, bilateral: Secondary | ICD-10-CM | POA: Diagnosis not present

## 2021-12-03 DIAGNOSIS — H18413 Arcus senilis, bilateral: Secondary | ICD-10-CM | POA: Diagnosis not present

## 2021-12-13 ENCOUNTER — Encounter: Payer: Self-pay | Admitting: Family Medicine

## 2021-12-13 ENCOUNTER — Ambulatory Visit (INDEPENDENT_AMBULATORY_CARE_PROVIDER_SITE_OTHER): Payer: Medicare HMO | Admitting: Family Medicine

## 2021-12-13 VITALS — BP 128/70 | HR 65 | Temp 97.7°F | Ht 65.0 in | Wt 144.2 lb

## 2021-12-13 DIAGNOSIS — R5383 Other fatigue: Secondary | ICD-10-CM | POA: Diagnosis not present

## 2021-12-13 DIAGNOSIS — I2510 Atherosclerotic heart disease of native coronary artery without angina pectoris: Secondary | ICD-10-CM | POA: Diagnosis not present

## 2021-12-13 DIAGNOSIS — R739 Hyperglycemia, unspecified: Secondary | ICD-10-CM | POA: Diagnosis not present

## 2021-12-13 DIAGNOSIS — M810 Age-related osteoporosis without current pathological fracture: Secondary | ICD-10-CM

## 2021-12-13 DIAGNOSIS — K219 Gastro-esophageal reflux disease without esophagitis: Secondary | ICD-10-CM | POA: Diagnosis not present

## 2021-12-13 DIAGNOSIS — I10 Essential (primary) hypertension: Secondary | ICD-10-CM

## 2021-12-13 DIAGNOSIS — Z0000 Encounter for general adult medical examination without abnormal findings: Secondary | ICD-10-CM | POA: Diagnosis not present

## 2021-12-13 DIAGNOSIS — E785 Hyperlipidemia, unspecified: Secondary | ICD-10-CM

## 2021-12-13 LAB — CBC WITH DIFFERENTIAL/PLATELET
Basophils Absolute: 0 10*3/uL (ref 0.0–0.1)
Basophils Relative: 0.5 % (ref 0.0–3.0)
Eosinophils Absolute: 0.1 10*3/uL (ref 0.0–0.7)
Eosinophils Relative: 0.7 % (ref 0.0–5.0)
HCT: 39 % (ref 36.0–46.0)
Hemoglobin: 12.9 g/dL (ref 12.0–15.0)
Lymphocytes Relative: 28.1 % (ref 12.0–46.0)
Lymphs Abs: 2.3 10*3/uL (ref 0.7–4.0)
MCHC: 33.1 g/dL (ref 30.0–36.0)
MCV: 92.3 fl (ref 78.0–100.0)
Monocytes Absolute: 0.6 10*3/uL (ref 0.1–1.0)
Monocytes Relative: 6.7 % (ref 3.0–12.0)
Neutro Abs: 5.3 10*3/uL (ref 1.4–7.7)
Neutrophils Relative %: 64 % (ref 43.0–77.0)
Platelets: 236 10*3/uL (ref 150.0–400.0)
RBC: 4.22 Mil/uL (ref 3.87–5.11)
RDW: 14.2 % (ref 11.5–15.5)
WBC: 8.3 10*3/uL (ref 4.0–10.5)

## 2021-12-13 LAB — COMPREHENSIVE METABOLIC PANEL
ALT: 18 U/L (ref 0–35)
AST: 23 U/L (ref 0–37)
Albumin: 4.1 g/dL (ref 3.5–5.2)
Alkaline Phosphatase: 71 U/L (ref 39–117)
BUN: 18 mg/dL (ref 6–23)
CO2: 28 mEq/L (ref 19–32)
Calcium: 9.4 mg/dL (ref 8.4–10.5)
Chloride: 106 mEq/L (ref 96–112)
Creatinine, Ser: 0.85 mg/dL (ref 0.40–1.20)
GFR: 64.31 mL/min (ref >60.00)
Glucose, Bld: 85 mg/dL (ref 70–99)
Potassium: 3.6 mEq/L (ref 3.5–5.1)
Sodium: 141 mEq/L (ref 135–145)
Total Bilirubin: 0.6 mg/dL (ref 0.2–1.2)
Total Protein: 6.9 g/dL (ref 6.0–8.3)

## 2021-12-13 LAB — LIPID PANEL
Cholesterol: 126 mg/dL (ref 0–200)
HDL: 58.6 mg/dL (ref >39.00)
LDL Cholesterol: 55 mg/dL (ref 0–99)
NonHDL: 67.5
Total CHOL/HDL Ratio: 2
Triglycerides: 64 mg/dL (ref 0.0–149.0)
VLDL: 12.8 mg/dL (ref 0.0–40.0)

## 2021-12-13 LAB — HEMOGLOBIN A1C: Hgb A1c MFr Bld: 6.1 % (ref 4.6–6.5)

## 2021-12-13 LAB — TSH: TSH: 2.42 u[IU]/mL (ref 0.35–5.50)

## 2021-12-13 MED ORDER — NITROGLYCERIN 0.4 MG SL SUBL: 0.4000 mg | SUBLINGUAL_TABLET | SUBLINGUAL | 2 refills | Status: DC | PRN

## 2021-12-13 NOTE — Patient Instructions (Addendum)
Please stop by lab before you go If you have mychart- we will send your results within 3 business days of [place] receiving them.  If you do not have mychart- we will call you about results within 5 business days of [place] receiving them.  *please also note that you will see labs on mychart as soon as they post. I will later go in and write notes on them- will say "notes from Dr. Yong Channel"  Nitroglycerin has been refilled and sent to your local pharmacy.  Please try to get at least 10 minutes of regular exercise daily and gradually work up from this - long term goal is to get at least 150 minutes of exercise per week.  Recommended follow up: Return in about 6 months (around 06/13/2022) for follow-up or sooner if needed.

## 2021-12-13 NOTE — Progress Notes (Signed)
Phone [phone]   Subjective:  Patient presents today for their annual physical. Chief complaint-noted.   See problem oriented charting- ROS- full  review of systems was completed and negative except for: eye redness at times, seasonal allergies  The following were reviewed and entered/updated in epic: Past Medical History:  Diagnosis Date   ALLERGIC RHINITIS 04/08/2008   Allergy    Basal cell carcinoma    back   CAD (coronary artery disease) 2008   status post anterior myocardial infarction, treated w/ a dru-eluting stent to the LAD   CAD, NATIVE VESSEL 11/10/2009   GERD (gastroesophageal reflux disease)    Hiatal hernia    HYPERLIPIDEMIA 03/29/2008   HYPERTENSION 03/29/2008   Myocardial infarction ([place])    2008   Osteopenia    Patient Active Problem List   Diagnosis Date Noted   CAD (coronary artery disease) 11/10/2009    Priority: High   Hyperglycemia, unspecified 05/11/2020    Priority: Medium    CKD (chronic kidney disease), stage III ([place]) 05/11/2020    Priority: Medium    Osteoporosis     Priority: Medium    Hyperlipidemia 03/29/2008    Priority: Medium    Essential hypertension 03/29/2008    Priority: Medium    History of basal cell carcinoma of skin     Priority: Low   GERD 05/05/2008    Priority: Low   Allergic rhinitis 04/08/2008    Priority: Low   Left hip pain 02/10/2019   Left shoulder pain 02/10/2019   S/P laparoscopic assisted vaginal hysterectomy (LAVH) 07/20/2018   Chronic pain of right knee 10/12/2017   Past Surgical History:  Procedure Laterality Date   ANTERIOR AND POSTERIOR REPAIR WITH SACROSPINOUS FIXATION N/A 07/20/2018   Procedure: ANTERIOR AND POSTERIOR REPAIR WITH SACROSPINOUS FIXATION;  Surgeon: Arvella Nigh, MD;  Location: [facility];  Service: Gynecology;  Laterality: N/A;   APPENDECTOMY     BASAL CELL CARCINOMA EXCISION     back   BLADDER SUSPENSION N/A 07/20/2018   Procedure: TRANSOBTURATOR Margarita Grizzle;  Surgeon: Arvella Nigh, MD;   Location: [facility];  Service: Gynecology;  Laterality: N/A;   LAPAROSCOPIC ASSISTED VAGINAL HYSTERECTOMY Bilateral    LAPAROSCOPIC VAGINAL HYSTERECTOMY WITH SALPINGO OOPHORECTOMY Bilateral 07/20/2018   Procedure: LAPAROSCOPIC ASSISTED VAGINAL HYSTERECTOMY WITH SALPINGO OOPHORECTOMY;  Surgeon: Arvella Nigh, MD;  Location: [facility];  Service: Gynecology;  Laterality: Bilateral;   Percutaneous transluminal coronary angioplasty and drug-eluting  09/2007   Stent placement in the left anterior descending   WISDOM TOOTH EXTRACTION      Family History  Problem Relation Age of Onset   Coronary artery disease Mother    Lung cancer Father    Heart disease Brother        stent placed   Coronary artery disease Maternal Uncle    CAD Son        died 23 of MI   Colon cancer Neg Hx    Esophageal cancer Neg Hx    Rectal cancer Neg Hx    Stomach cancer Neg Hx     Medications- reviewed and updated Current Outpatient Medications  Medication Sig Dispense Refill   acetaminophen (TYLENOL) 500 MG tablet Take 1,000 mg by mouth 2 (two) times daily as needed for moderate pain or headache.     aspirin 81 MG tablet Take 81 mg by mouth every evening.     atorvastatin (LIPITOR) 80 MG tablet TAKE 1 TABLET BY MOUTH ONCE DAILY OR AS DIRECTED 90 tablet 3  diclofenac sodium (VOLTAREN) 1 % GEL Apply 2 g topically 3 (three) times daily as needed. 100 g 5   ezetimibe (ZETIA) 10 MG tablet Take 1 tablet (10 mg total) by mouth daily. 90 tablet 3   famotidine (PEPCID) 20 MG tablet Take 1 tablet (20 mg total) by mouth 2 (two) times daily as needed for heartburn or indigestion. 180 tablet 3   ibandronate (BONIVA) 150 MG tablet Take 150 mg by mouth every 30 (thirty) days.     nitroGLYCERIN (NITROSTAT) 0.4 MG SL tablet Place 1 tablet (0.4 mg total) under the tongue every 5 (five) minutes as needed for chest pain (call 911 if no relief after 2nd dose). 25 tablet 2   No current facility-administered medications for this visit.     Allergies-reviewed and updated Allergies  Allergen Reactions   Penicillins Rash    Has patient had a PCN reaction causing immediate rash, facial/tongue/throat swelling, SOB or lightheadedness with hypotension: Yes Has patient had a PCN reaction causing severe rash involving mucus membranes or skin necrosis: No Has patient had a PCN reaction that required hospitalization: No Has patient had a PCN reaction occurring within the last 10 years: No If all of the above answers are "NO", then may proceed with Cephalosporin use.     Social History   Social History Narrative   Married 1970. 2 living children. 2 living children- had 4 (lost one at age 63 Johnathon drug related to heart attack and one at 55 Mark to heart attack not drug related), 6 grandkids. 11 greatgrandkids.       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 Oropharynx normal Neck: no thyromegaly CV: RRR no murmurs rubs or gallops Lungs: CTAB no crackles, wheeze, rhonchi Abdomen: soft/nontender/nondistended/normal bowel sounds. No rebound or guarding.  Ext: no edema Skin: warm, dry Neuro: grossly normal, moves all extremities, PERRLA   Assessment and Plan   80 y.o. female presenting for annual physical.  Health Maintenance counseling: 1. Anticipatory guidance: Patient counseled regarding regular dental exams -q6 months advised- misses some, eye exams -dealing with dry eyes- goes at least yearly,  avoiding smoking and second hand smoke , limiting alcohol to 1 beverage per day- doesn't drink , no illicit drugs .   2. Risk factor reduction:  Advised patient of need for regular exercise and diet rich and fruits and vegetables to  reduce risk of heart attack and stroke.  Exercise- not doing well on exercise- encouraged to restart Diet/weight management-last cpe we discuss stabilizing weight after patient was down by 8 lbs form a prior physical.  Weight appears largely stable this year-slight increase which is preferable Wt Readings from Last 3 Encounters:  12/13/21 144 lb 3.2 oz (65.4 kg)  05/15/21 139 lb (63 kg)  03/15/21 141 lb (64 kg)  3. Immunizations/screenings/ancillary studies- immunizations are up-to-date. Immunization History  Administered Date(s) Administered   Fluad Quad(high Dose 65+) 08/25/2019, 11/16/2020, 09/18/2021   Influenza Split 09/27/2011, 10/13/2012   Influenza Whole 09/29/2008, 09/27/2009, 10/04/2010   Influenza, High Dose Seasonal PF 09/24/2013, 10/04/2014, 09/28/2015, 09/27/2016,  08/28/2017, 09/21/2018   PFIZER(Purple Top)SARS-COV-2 Vaccination 01/08/2020, 01/29/2020, 10/17/2020, 03/27/2021   Pfizer Covid-19 Vaccine Bivalent Booster 109yrs & up 11/19/2021   Pneumococcal Conjugate-13 12/02/2014   Pneumococcal Polysaccharide-23 03/29/2008   Pneumococcal-Unspecified 03/05/2021   Td 03/29/2008   Tdap 12/02/2014   Zoster Recombinat (Shingrix) 05/26/2018, 08/25/2018   Zoster, Live 02/23/2008  4. Cervical cancer screening- still follows up with gynecology-never had abnormal Pap smear.  Follow Dr. Barnie Mort in the past 5. Breast cancer screening-  breast exam with gynecology and mammogram - continues to get mammograms but past age based screening recommendations  6. Colon cancer screening - never had abnormal colonoscopy.  We tried to do a final Cologuard 3 years ago but was not covered so we have deferred 7. Skin cancer screening- Follows with Dr. Ledell Peoples office every 6 months. Advised regular sunscreen use. Denies worrisome, changing, or new skin lesions.  8. Birth control/STD check- monogamous and postmenopausal 9. Osteoporosis screening at 65- follows with gynecology physician for  women's - see below 10. Smoking associated screening - never  smoker  Status of chronic or acute concerns   #CAD/hyperlipidemia S: Medication:atorvastatin 80mg , Zetia 10mg . Aspirin 81 mg. Nitroglycerin available as needed. Gets some leg cramps at night if feet get cold- if sleeps in socks much better- advised to sleep in socks  In regards to CAD-last cardiology visit was in March 2022-denies chest pain or shortness of breath Lab Results  Component Value Date   CHOL 126 11/14/2020   HDL 55 11/14/2020   LDLCALC 53 11/14/2020   LDLDIRECT 57.0 11/11/2018   TRIG 99 11/14/2020   CHOLHDL 2.3 11/14/2020  A/P: For CAD-asymptomatic- continue current meds For HLD-LDL goal 70 or less-has been at goal-update again today- likely continue current meds  #hypertension- blood pressure tends to run higher in office S: medication: No medication -previously Losartan 25mg  down from 100mg  due to orthostatic symptoms but once again eventually stopped- even syncopal episode -hctz stopped 2021 due to lightheadedness, later stopped coreg 25 mg BID (bradycardia)  Home readings #s: 166/74 with HR 59 and noted as irregular on her machine- did not feel poorly at the time - ranges at home from 110s up to 160s but appears most #s would average in 130s. Diastolic is controlled. HR usually 60s or 70s BP Readings from Last 3 Encounters:  12/13/21 128/70  07/13/21 129/75  05/15/21 127/74  A/P: Well-controlled overall without medication-continue to monitor. Some fluctuations but I am concerned abotu syncope or presyncope if we were to restart medicine so we opted to hold off with encouraging average  -also had irregular HR note that one day when BP was high up to 160s recently- could be as simple as PVCs but if persistent issues she wil let me know and we can order cardiac monitor- if continues to occur also run by cardiology  # GERD S:Medication: Pepcid 20mg  in the past-in 2022 not having to use A/P: no recent issues  even without meds- monitor   # Hyperglycemia/insulin resistance/prediabetes S:  Medication: None Lab Results  Component Value Date   HGBA1C 5.7 (H) 11/14/2020   HGBA1C 6.2 05/10/2020   HGBA1C 6.1 11/10/2019  A/P: hopefully stable or improved- update a1c today  #Osteoporosis-managed by Dr. Radene K[facility]. Patient is on boniva monthly. She is compliant with calcium and vitamin D   #covid in 2022in June and energy has been lower since then- will check TSH but could be long covid   Recommended follow up: Return in about  6 months (around 06/13/2022) for follow-up or sooner if needed. Future Appointments  Date Time Provider [place]  07/25/2022  8:45 AM [facility]   Lab/Order associations:NOT fasting   ICD-10-CM   1. Preventative health care  Z00.00     2. Essential hypertension  I10 CBC with Differential/Platelet    Comprehensive metabolic panel    Lipid panel    3. Hyperlipidemia, unspecified hyperlipidemia type  E78.5 CBC with Differential/Platelet    Comprehensive metabolic panel    Lipid panel    TSH    4. Hyperglycemia, unspecified  R73.9 Hemoglobin A1c    5. Coronary artery disease involving native coronary artery of native heart without angina pectoris  I25.10     6. Gastroesophageal reflux disease without esophagitis  K21.9     7. Age-related osteoporosis without current pathological fracture  M81.0     8. Fatigue, unspecified type  R53.83 TSH      Meds ordered this encounter  Medications   nitroGLYCERIN (NITROSTAT) 0.4 MG SL tablet    Sig: Place 1 tablet (0.4 mg total) under the tongue every 5 (five) minutes as needed for chest pain (call 911 if no relief after 2nd dose).    Dispense:  25 tablet    Refill:  2   I,Harris Phan,acting as a scribe for Garret Reddish, MD.,have documented all relevant documentation on the behalf of Garret Reddish, MD,as directed by  Garret Reddish, MD while in the presence of Garret Reddish, MD.  I, Garret Reddish, MD, have reviewed all documentation for this visit. The documentation on 12/13/21 for the exam, diagnosis, procedures, and orders are all accurate and complete.   Return precautions advised.  Garret Reddish, MD

## 2021-12-30 DIAGNOSIS — J209 Acute bronchitis, unspecified: Secondary | ICD-10-CM | POA: Diagnosis not present

## 2021-12-30 DIAGNOSIS — R059 Cough, unspecified: Secondary | ICD-10-CM | POA: Diagnosis not present

## 2022-01-07 DIAGNOSIS — H524 Presbyopia: Secondary | ICD-10-CM | POA: Diagnosis not present

## 2022-03-19 ENCOUNTER — Ambulatory Visit (INDEPENDENT_AMBULATORY_CARE_PROVIDER_SITE_OTHER): Payer: Medicare HMO | Admitting: Family Medicine

## 2022-03-19 VITALS — BP 177/78 | HR 75 | Temp 98.0°F | Ht 65.0 in | Wt 143.6 lb

## 2022-03-19 DIAGNOSIS — K219 Gastro-esophageal reflux disease without esophagitis: Secondary | ICD-10-CM | POA: Diagnosis not present

## 2022-03-19 DIAGNOSIS — I10 Essential (primary) hypertension: Secondary | ICD-10-CM

## 2022-03-19 MED ORDER — PANTOPRAZOLE SODIUM 40 MG PO TBEC: 40.0000 mg | DELAYED_RELEASE_TABLET | Freq: Every day | ORAL | 3 refills | Status: DC

## 2022-03-19 NOTE — Patient Instructions (Signed)
It was very nice to see you today! ? ?I think your Jaclyn Prime may be causing some irritation in your esophagus.  We will start an acid blocker medication and refer you to see Dr. Blanch Media office. ? ?Please let us know if your blood pressure is persistently low.  ? ?Take care, ?Dr Jerline Pain ? ?PLEASE NOTE: ? ?If you had any lab tests please let us know if you have not heard back within a few days. You may see your results on mychart before we have a chance to review them but we will give you a call once they are reviewed by [place]. If we ordered any referrals today, please let us know if you have not heard from their office within the next week.  ? ?Please try these tips to maintain a healthy lifestyle: ? ?Eat at least 3 REAL meals and 1-2 snacks per day.  Aim for no more than 5 hours between eating.  If you eat breakfast, please do so within one hour of getting up.  ? ?Each meal should contain half fruits/vegetables, one quarter protein, and one quarter carbs (no bigger than a computer mouse) ? ?Cut down on sweet beverages. This includes juice, soda, and sweet tea.  ? ?Drink at least 1 glass of water with each meal and aim for at least 8 glasses per day ? ?Exercise at least 150 minutes every week.   ?

## 2022-03-19 NOTE — Progress Notes (Signed)
? ?  Jennifer Gross is a 81 y.o. female who presents today for an office visit. ? ?Assessment/Plan:  ?Chronic Problems Addressed Today: ?GERD -she is having small amount of dysphagia but no regurgitation.  This is likely being exacerbated by her Boniva.  We will start Protonix and refer to GI.  She has had to have esophageal dilation in the past. ? ?Essential Hypertension -elevated today though most of her home readings are 120 to 130s over 70s to 80s.  She did have 1 low of 80s/50s that was isolated.  Possibly due to dehydration.  Given her only single low reading without any other signs or symptoms we will continue with watchful waiting.  She will let us know if her blood pressures become persistently low or she develops any symptoms such as chest pain, shortness of breath, dizziness, or syncope. ? ? ?  ?Subjective:  ?HPI: ? ?Patient here with worsening acid reflux. Started about a few weeks ago. She feel like food is stuck in her esophagus. Symptoms seems to be getting worse. This has been waking her up at night. She has tried Pepcid with modest improvement. She feels like her acid has been coming up to her throat. Has been burping. She has been trying to eat healthy diet. No recent medication changes. Diet changes seem to help. She had something similar in 2015 and had endoscopy which showed Esophageal stricture. No nausea or vomiting.  ? ?She is concerned about her low blood pressure. She notes she was cooking at home when she felt her blood pressure dropped. She checked her blood pressure and was found to be in the 80's. Had similar issue in the past where she passed out due to this. She notes her blood pressure was back to normal later in the day. She has not been on any medication. Denies loss of consciousness. She thinks she could have been dehydrated.  ?   ?  ?Objective:  ?Physical Exam: ?BP (!) 177/78 (BP Location: Left Arm)   Pulse 75   Temp 98 ?F (36.7 ?C) (Temporal)   Ht '5\' 5"'$  (1.651 m)   Wt 143 lb  9.6 oz (65.1 kg)   SpO2 97%   BMI 23.90 kg/m?   ?Gen: No acute distress, resting comfortably ?CV: Regular rate and rhythm with no murmurs appreciated ?Pulm: Normal work of breathing, clear to auscultation bilaterally with no crackles, wheezes, or rhonchi ?Neuro: Grossly normal, moves all extremities ?Psych: Normal affect and thought content ? ?   ? ? ?I,Savera Zaman,acting as a Education administrator for Dimas Chyle, MD.,have documented all relevant documentation on the behalf of Dimas Chyle, MD,as directed by  Dimas Chyle, MD while in the presence of Dimas Chyle, MD.  ? ?I, Dimas Chyle, MD, have reviewed all documentation for this visit. The documentation on 03/19/22 for the exam, diagnosis, procedures, and orders are all accurate and complete. ? ?Algis Greenhouse. Jerline Pain, MD ?03/19/2022 11:11 AM  ? ?

## 2022-03-22 ENCOUNTER — Encounter: Payer: Self-pay | Admitting: Cardiovascular Disease

## 2022-03-22 ENCOUNTER — Ambulatory Visit: Payer: Medicare HMO | Admitting: Cardiovascular Disease

## 2022-03-22 VITALS — BP 130/80 | HR 76 | Ht 65.0 in | Wt 143.2 lb

## 2022-03-22 DIAGNOSIS — I25119 Atherosclerotic heart disease of native coronary artery with unspecified angina pectoris: Secondary | ICD-10-CM

## 2022-03-22 DIAGNOSIS — I10 Essential (primary) hypertension: Secondary | ICD-10-CM | POA: Diagnosis not present

## 2022-03-22 DIAGNOSIS — R55 Syncope and collapse: Secondary | ICD-10-CM

## 2022-03-22 DIAGNOSIS — E782 Mixed hyperlipidemia: Secondary | ICD-10-CM

## 2022-03-22 NOTE — Progress Notes (Signed)
?Cardiology Office Note:   ? ?Date:  03/22/2022  ? ?ID:  Jennifer Gross, DOB 01-16-1941, MRN [id number] ? ?PCP:  Marin Olp, MD ?  ?[facility] Providers ?Cardiologist:  Sherren Mocha, MD    ? ?Referring MD: Marin Olp, MD  ? ?Chief Complaint  ?Patient presents with  ? Coronary Artery Disease  ? ? ?History of Present Illness:   ? ?Jennifer Gross is a 81 y.o. female with a hx of coronary artery disease, presenting for follow-up evaluation.   The patient initially presented in 2008 with an anterior wall MI treated with overlapping drug-eluting stents in the LAD and balloon angioplasty of the first diagonal branch.  She has not had any recurrent ischemic events and has done quite well from a cardiovascular perspective. She now has 11 great grandchildren. She had an episode of syncope last year and was having issues with symptomatic hypotension. She is now off of all antihypertensive medications.  She is feeling better since she stopped her antihypertensive medicines.  States her blood pressures at home have still been controlled.  She does have episodes of chest discomfort that are not related to exertion.  They occur in the center of the chest and feel like a burning or pressure sensation.  She felt that this may be related to her Jaclyn Prime and that is being evaluated by her primary physician.  She does have concerns about her heart with her history of CAD and prior MI.  She is able to do work in her yard without any exertional chest pain or pressure.  She has no dyspnea, palpitations, edema, orthopnea, or PND. ? ?Past Medical History:  ?Diagnosis Date  ? ALLERGIC RHINITIS 04/08/2008  ? Allergy   ? Basal cell carcinoma   ? back  ? CAD (coronary artery disease) 2008  ? status post anterior myocardial infarction, treated w/ a dru-eluting stent to the LAD  ? CAD, NATIVE VESSEL 11/10/2009  ? GERD (gastroesophageal reflux disease)   ? Hiatal hernia   ? HYPERLIPIDEMIA 03/29/2008  ? HYPERTENSION 03/29/2008  ?  Myocardial infarction [facility])   ? 2008  ? Osteopenia   ? ? ?Past Surgical History:  ?Procedure Laterality Date  ? ANTERIOR AND POSTERIOR REPAIR WITH SACROSPINOUS FIXATION N/A 07/20/2018  ? Procedure: ANTERIOR AND POSTERIOR REPAIR WITH SACROSPINOUS FIXATION;  Surgeon: Arvella Nigh, MD;  Location: [facility];  Service: Gynecology;  Laterality: N/A;  ? APPENDECTOMY    ? BASAL CELL CARCINOMA EXCISION    ? back  ? BLADDER SUSPENSION N/A 07/20/2018  ? Procedure: TRANSOBTURATOR Margarita Grizzle;  Surgeon: Arvella Nigh, MD;  Location: [facility];  Service: Gynecology;  Laterality: N/A;  ? LAPAROSCOPIC ASSISTED VAGINAL HYSTERECTOMY Bilateral   ? LAPAROSCOPIC VAGINAL HYSTERECTOMY WITH SALPINGO OOPHORECTOMY Bilateral 07/20/2018  ? Procedure: LAPAROSCOPIC ASSISTED VAGINAL HYSTERECTOMY WITH SALPINGO OOPHORECTOMY;  Surgeon: Arvella Nigh, MD;  Location: [facility];  Service: Gynecology;  Laterality: Bilateral;  ? Percutaneous transluminal coronary angioplasty and drug-eluting  09/2007  ? Stent placement in the left anterior descending  ? WISDOM TOOTH EXTRACTION    ? ? ?Current Medications: ?Current Meds  ?Medication Sig  ? acetaminophen (TYLENOL) 500 MG tablet Take 1,000 mg by mouth 2 (two) times daily as needed for moderate pain or headache.  ? aspirin 81 MG tablet Take 81 mg by mouth every evening.  ? atorvastatin (LIPITOR) 80 MG tablet TAKE 1 TABLET BY MOUTH ONCE DAILY OR AS DIRECTED  ? diclofenac sodium (VOLTAREN) 1 % GEL Apply 2 g  topically 3 (three) times daily as needed.  ? ezetimibe (ZETIA) 10 MG tablet Take 1 tablet (10 mg total) by mouth daily.  ? famotidine (PEPCID) 20 MG tablet Take 1 tablet (20 mg total) by mouth 2 (two) times daily as needed for heartburn or indigestion.  ? ibandronate (BONIVA) 150 MG tablet Take 150 mg by mouth every 30 (thirty) days.  ? nitroGLYCERIN (NITROSTAT) 0.4 MG SL tablet Place 1 tablet (0.4 mg total) under the tongue every 5 (five) minutes as needed for chest pain (call 911 if no relief after 2nd dose).  ?  pantoprazole (PROTONIX) 40 MG tablet Take 1 tablet (40 mg total) by mouth daily.  ?  ? ?Allergies:   Penicillins  ? ?Social History  ? ?Socioeconomic History  ? Marital status: Married  ?  Spouse name: Not on file  ? Number of children: 4  ? Years of education: Not on file  ? Highest education level: Not on file  ?Occupational History  ? Occupation: retired  ?Tobacco Use  ? Smoking status: Never  ? Smokeless tobacco: Never  ?Vaping Use  ? Vaping Use: Never used  ?Substance and Sexual Activity  ? Alcohol use: No  ? Drug use: No  ? Sexual activity: Not on file  ?Other Topics Concern  ? Not on file  ?Social History Narrative  ? Married 1970. 2 living children. 2 living children- had 4 (lost one at age 36 Johnathon drug related to heart attack and one at 4 Mark to heart attack not drug related), 6 grandkids. 11 greatgrandkids.   ?   ? 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   ? Hobbies: cooking  ? ?Social Determinants of Health  ? ?Financial Resource Strain: Low Risk   ? Difficulty of Paying Living Expenses: Not hard at all  ?Food Insecurity: No Food Insecurity  ? Worried About Charity fundraiser in the Last Year: Never true  ? Ran Out of Food in the Last Year: Never true  ?Transportation Needs: No Transportation Needs  ? Lack of Transportation (Medical): No  ? Lack of Transportation (Non-Medical): No  ?Physical Activity: Insufficiently Active  ? Days of Exercise per Week: 3 days  ? Minutes of Exercise per Session: 30 min  ?Stress: No Stress Concern Present  ? Feeling of Stress : Not at all  ?Social Connections: Moderately Integrated  ? Frequency of Communication with Friends and Family: More than three times a week  ? Frequency of Social Gatherings with Friends and Family: More than three times a week  ? Attends Religious Services: More than 4 times per year  ? Active Member of Clubs or Organizations: No  ? Attends Archivist Meetings: Never  ? Marital Status: Married  ?  ? ?Family  History: ?The patient's family history includes CAD in her son; Coronary artery disease in her maternal uncle and mother; Heart disease in her brother; Lung cancer in her father. There is no history of Colon cancer, Esophageal cancer, Rectal cancer, or Stomach cancer. ? ?ROS:   ?Please see the history of present illness.    ?All other systems reviewed and are negative. ? ?EKGs/Labs/Other Studies Reviewed:   ? ?EKG:  EKG is ordered today.  The ekg ordered today demonstrates normal sinus rhythm 68 bpm, nonspecific ST abnormality. ? ?Recent Labs: ?12/13/2021: ALT 18; BUN 18; Creatinine, Ser 0.85; Hemoglobin 12.9; Platelets 236.0; Potassium 3.6; Sodium 141; TSH 2.42  ?Recent Lipid Panel ?   ?  Component Value Date/Time  ? CHOL 126 12/13/2021 1428  ? TRIG 64.0 12/13/2021 1428  ? HDL 58.60 12/13/2021 1428  ? CHOLHDL 2 12/13/2021 1428  ? VLDL 12.8 12/13/2021 1428  ? [place] 55 12/13/2021 1428  ? LDLCALC 53 11/14/2020 1543  ? LDLDIRECT 57.0 11/11/2018 1051  ? ? ? ?Risk Assessment/Calculations:   ?  ? ?    ? ?Physical Exam:   ? ?VS:  BP 130/80   Pulse 76   Ht '5\' 5"'$  (1.651 m)   Wt 143 lb 3.2 oz (65 kg)   SpO2 96%   BMI 23.83 kg/m?    ? ?Wt Readings from Last 3 Encounters:  ?03/22/22 143 lb 3.2 oz (65 kg)  ?03/19/22 143 lb 9.6 oz (65.1 kg)  ?12/13/21 144 lb 3.2 oz (65.4 kg)  ?  ? ?GEN:  Well nourished, well developed in no acute distress ?HEENT: Normal ?NECK: No JVD; No carotid bruits ?LYMPHATICS: No lymphadenopathy ?CARDIAC: RRR, no murmurs, rubs, gallops ?RESPIRATORY:  Clear to auscultation without rales, wheezing or rhonchi  ?ABDOMEN: Soft, non-tender, non-distended ?MUSCULOSKELETAL:  No edema; No deformity  ?SKIN: Warm and dry ?NEUROLOGIC:  Alert and oriented x 3 ?PSYCHIATRIC:  Normal affect  ? ?ASSESSMENT:   ? ?1. Coronary artery disease involving native coronary artery of native heart with angina pectoris ([place])   ?2. Essential hypertension   ?3. Mixed hyperlipidemia   ?4. Syncope and collapse   ? ?PLAN:   ? ?In order  of problems listed above: ? ?Patient with atypical chest discomfort.  However, known history of anterior MI and remote LAD stenting.  She has had no ischemic evaluation in the past decade.  I have recommended an exerci

## 2022-03-22 NOTE — Patient Instructions (Signed)
Medication Instructions:  ?Your physician recommends that you continue on your current medications as directed. Please refer to the Current Medication list given to you today. ? ?*If you need a refill on your cardiac medications before your next appointment, please call your pharmacy* ? ? ?Lab Work: ?NONE ?If you have labs (blood work) drawn today and your tests are completely normal, you will receive your results only by: ?MyChart Message (if you have MyChart) OR ?A paper copy in the mail ?If you have any lab test that is abnormal or we need to change your treatment, we will call you to review the results. ? ? ?Testing/Procedures: ?ECHO ?Your physician has requested that you have an echocardiogram. Echocardiography is a painless test that uses sound waves to create images of your heart. It provides your doctor with information about the size and shape of your heart and how well your heart?s chambers and valves are working. This procedure takes approximately one hour. There are no restrictions for this procedure. ? ?Exercise Myoview Stress Test ?Your physician has requested that you have en exercise stress myoview. For further information please visit [email or web address]. Please follow instruction sheet, as given. ? ?Follow-Up: ?At [facility] you and your health needs are our priority.  As part of our continuing mission to provide you with exceptional heart care, we have created designated Provider Care Teams.  These Care Teams include your primary Cardiologist (physician) and Advanced Practice Providers (APPs -  Physician Assistants and Nurse Practitioners) who all work together to provide you with the care you need, when you need it. ? ? ?Your next appointment:   ?1 year(s) ? ?The format for your next appointment:   ?In Person ? ?Provider:   ?Sherren Mocha, MD   ? ? ?Other Instructions ?See separate instructions for stress test  ?  ?

## 2022-04-02 ENCOUNTER — Encounter: Payer: Self-pay | Admitting: Internal Medicine

## 2022-04-08 ENCOUNTER — Encounter (HOSPITAL_COMMUNITY): Payer: Self-pay | Admitting: *Deleted

## 2022-04-08 ENCOUNTER — Other Ambulatory Visit: Payer: Self-pay | Admitting: Family Medicine

## 2022-04-08 ENCOUNTER — Telehealth (HOSPITAL_COMMUNITY): Payer: Self-pay | Admitting: *Deleted

## 2022-04-08 NOTE — Telephone Encounter (Signed)
MyChart letter sent outlining instructions for upcoming stress test on 04/15/22 at 10:15 ?

## 2022-04-15 ENCOUNTER — Ambulatory Visit (HOSPITAL_BASED_OUTPATIENT_CLINIC_OR_DEPARTMENT_OTHER): Payer: Medicare HMO

## 2022-04-15 ENCOUNTER — Ambulatory Visit (HOSPITAL_COMMUNITY): Payer: Medicare HMO | Attending: Internal Medicine

## 2022-04-15 DIAGNOSIS — I25119 Atherosclerotic heart disease of native coronary artery with unspecified angina pectoris: Secondary | ICD-10-CM

## 2022-04-15 DIAGNOSIS — R55 Syncope and collapse: Secondary | ICD-10-CM

## 2022-04-15 LAB — MYOCARDIAL PERFUSION IMAGING
LV dias vol: 54 mL (ref 46–106)
LV sys vol: 11 mL
Nuc Stress EF: 79 %
Peak HR: 102 {beats}/min
Rest HR: 68 {beats}/min
Rest Nuclear Isotope Dose: 10.4 mCi
SDS: 3
SRS: 3
SSS: 6
ST Depression (mm): 0 mm
Stress Nuclear Isotope Dose: 30.6 mCi
TID: 1.07

## 2022-04-15 LAB — ECHOCARDIOGRAM COMPLETE
Area-P 1/2: 3.72 cm2
S' Lateral: 2.7 cm

## 2022-04-15 MED ORDER — REGADENOSON 0.4 MG/5ML IV SOLN
0.4000 mg | Freq: Once | INTRAVENOUS | Status: AC
  Administered 2022-04-15: 0.4 mg via INTRAVENOUS

## 2022-04-15 MED ORDER — TECHNETIUM TC 99M TETROFOSMIN IV KIT
30.6000 | PACK | Freq: Once | INTRAVENOUS | Status: AC | PRN
  Administered 2022-04-15: 30.6 via INTRAVENOUS
  Filled 2022-04-15: qty 31

## 2022-04-15 MED ORDER — TECHNETIUM TC 99M TETROFOSMIN IV KIT
10.4000 | PACK | Freq: Once | INTRAVENOUS | Status: AC | PRN
  Administered 2022-04-15: 10.4 via INTRAVENOUS
  Filled 2022-04-15: qty 11

## 2022-04-25 ENCOUNTER — Encounter: Payer: Self-pay | Admitting: Internal Medicine

## 2022-04-25 ENCOUNTER — Ambulatory Visit: Payer: Medicare HMO | Admitting: Internal Medicine

## 2022-04-25 VITALS — BP 140/50 | HR 95 | Ht 65.0 in | Wt 143.0 lb

## 2022-04-25 DIAGNOSIS — K219 Gastro-esophageal reflux disease without esophagitis: Secondary | ICD-10-CM | POA: Diagnosis not present

## 2022-04-25 DIAGNOSIS — K222 Esophageal obstruction: Secondary | ICD-10-CM

## 2022-04-25 DIAGNOSIS — R1013 Epigastric pain: Secondary | ICD-10-CM

## 2022-04-25 MED ORDER — OMEPRAZOLE 20 MG PO CPDR: 20.0000 mg | DELAYED_RELEASE_CAPSULE | Freq: Every day | ORAL | 3 refills | Status: DC

## 2022-04-25 NOTE — Progress Notes (Signed)
HISTORY OF PRESENT ILLNESS: ? ?Jennifer Gross is a 81 y.o. female with a history of coronary artery disease status post remote coronary artery stent placement on Plavix, hypertension, hyperlipidemia, and GERD complicated by peptic stricture.  The patient is sent today by her primary care provider Dr. Jerline Pain regarding GERD symptoms and dysphagia.  I last saw the patient May 23, 2014 regarding chronic GERD with intermittent solid food dysphagia.  See that dictation.  She subsequently underwent upper endoscopy with balloon dilation of the esophagus June 17, 2014.  She has not been seen since. ? ?Patient's current history is that of dyspeptic symptoms with pyrosis and chest discomfort for which she saw Dr. Jerline Pain March 19, 2022.  He diagnosed GERD and made GI referral.  Patient had been taking Boniva which was stopped.  She was placed on pantoprazole, but stopped this after 2 days due to diarrhea.  She has not been on PPI since.  Thereafter she underwent cardiac evaluation with Dr. Burt Knack including Myoview test.  Her work-up was favorable with no concerns over active coronary artery disease.  Patient tells me that her GI symptoms have resolved.  She feels that Jaclyn Prime was responsible.  She tells me that she is not having significant dysphagia and does not feel like she needs esophageal dilation at this time.  She does have intermittent reflux symptoms however.  Blood work from December 2022 shows normal comprehensive metabolic panel and normal CBC with hemoglobin 12.9. ? ?REVIEW OF SYSTEMS: ? ?All non-GI ROS negative entirely. ? ?Past Medical History:  ?Diagnosis Date  ? ALLERGIC RHINITIS 04/08/2008  ? Allergy   ? Basal cell carcinoma   ? back  ? CAD (coronary artery disease) 2008  ? status post anterior myocardial infarction, treated w/ a dru-eluting stent to the LAD  ? CAD, NATIVE VESSEL 11/10/2009  ? GERD (gastroesophageal reflux disease)   ? Hiatal hernia   ? HYPERLIPIDEMIA 03/29/2008  ? HYPERTENSION 03/29/2008  ?  Myocardial infarction [facility])   ? 2008  ? Osteopenia   ? ? ?Past Surgical History:  ?Procedure Laterality Date  ? ANTERIOR AND POSTERIOR REPAIR WITH SACROSPINOUS FIXATION N/A 07/20/2018  ? Procedure: ANTERIOR AND POSTERIOR REPAIR WITH SACROSPINOUS FIXATION;  Surgeon: Arvella Nigh, MD;  Location: [facility];  Service: Gynecology;  Laterality: N/A;  ? APPENDECTOMY    ? BASAL CELL CARCINOMA EXCISION    ? back  ? BLADDER SUSPENSION N/A 07/20/2018  ? Procedure: TRANSOBTURATOR Margarita Grizzle;  Surgeon: Arvella Nigh, MD;  Location: [facility];  Service: Gynecology;  Laterality: N/A;  ? LAPAROSCOPIC ASSISTED VAGINAL HYSTERECTOMY Bilateral   ? LAPAROSCOPIC VAGINAL HYSTERECTOMY WITH SALPINGO OOPHORECTOMY Bilateral 07/20/2018  ? Procedure: LAPAROSCOPIC ASSISTED VAGINAL HYSTERECTOMY WITH SALPINGO OOPHORECTOMY;  Surgeon: Arvella Nigh, MD;  Location: [facility];  Service: Gynecology;  Laterality: Bilateral;  ? Percutaneous transluminal coronary angioplasty and drug-eluting  09/2007  ? Stent placement in the left anterior descending  ? WISDOM TOOTH EXTRACTION    ? ? ?Social History ?DNASIA Gross  reports that she has never smoked. She has never used smokeless tobacco. She reports that she does not drink alcohol and does not use drugs. ? ?family history includes CAD in her son; Coronary artery disease in her maternal uncle and mother; Heart disease in her brother; Lung cancer in her father. ? ?Allergies  ?Allergen Reactions  ? Penicillins Rash  ?  Has patient had a PCN reaction causing immediate rash, facial/tongue/throat swelling, SOB or lightheadedness with hypotension: Yes ?Has patient had a  PCN reaction causing severe rash involving mucus membranes or skin necrosis: No ?Has patient had a PCN reaction that required hospitalization: No ?Has patient had a PCN reaction occurring within the last 10 years: No ?If all of the above answers are "NO", then may proceed with Cephalosporin use. ?  ? ? ?  ? ?PHYSICAL EXAMINATION: ?Vital signs: BP (!) 140/50    Pulse 95   Ht '5\' 5"'$  (1.651 m)   Wt 143 lb (64.9 kg)   BMI 23.80 kg/m?   ?Constitutional: generally well-appearing, no acute distress ?Psychiatric: alert and oriented x3, cooperative ?Eyes: extraocular movements intact, anicteric, conjunctiva pink ?Mouth: oral pharynx moist, no lesions ?Neck: supple no lymphadenopathy ?Cardiovascular: heart regular rate and rhythm, no murmur ?Lungs: clear to auscultation bilaterally ?Abdomen: soft, nontender, nondistended, no obvious ascites, no peritoneal signs, normal bowel sounds, no organomegaly ?Rectal: Omitted ?Extremities: no clubbing, cyanosis, or lower extremity edema bilaterally ?Skin: no lesions on visible extremities ?Neuro: No focal deficits.  Cranial nerves intact ? ?ASSESSMENT: ? ?1.  GERD with history of esophagitis and esophageal stricture.  Intermittent symptoms. ?2.  History of esophageal stricture requiring esophageal dilation.  At this time not complaining of significant dysphagia. ?3.  Dyspepsia related to Boniva ?4.  General medical problems.  Favorable recent cardiac evaluation ? ? ?PLAN: ? ?1.  Reflux precautions ?2.  Prescribe omeprazole 20 mg daily.  Medication risk reviewed.  This to address intermittent reflux symptoms and protect against worsening esophageal stricturing. ?3.  Routine GI office follow-up 1 year.  I have asked her to contact me in the interim should she have questions or problems.  She agrees ?4.  Resume general medical care with Dr. Jerline Pain ?A total time of 30 minutes was spent preparing to see the patient, reviewing various tests, laboratories and studies.  Reviewing outside office evaluations with her PCP and cardiologist.  Obtaining comprehensive history and performing medically appropriate physical examination.  Counseling the patient and educating the patient regarding the above listed issues.  Ordering medication.  Defining follow-up parameters/intervals.  And documenting clinical information in the health record ? ? ? ?  ?

## 2022-04-25 NOTE — Patient Instructions (Signed)
If you are age 82 or older, your body mass index should be between 23-30. Your Body mass index is 23.8 kg/m?Marland Kitchen If this is out of the aforementioned range listed, please consider follow up with your Primary Care Provider. ? ?If you are age 23 or younger, your body mass index should be between 19-25. Your Body mass index is 23.8 kg/m?Marland Kitchen If this is out of the aformentioned range listed, please consider follow up with your Primary Care Provider.  ? ?________________________________________________________ ? ?The [place] [facility] providers would like to encourage you to use [facility] to communicate with providers for non-urgent requests or questions.  Due to long hold times on the telephone, sending your provider a message by [facility] may be a faster and more efficient way to get a response.  Please allow 48 business hours for a response.  Please remember that this is for non-urgent requests.  ?_______________________________________________________ ? ?We have sent the following medications to your pharmacy for you to pick up at your convenience:  Omeprazole ? ?Please follow up in one year ? ?

## 2022-05-29 DIAGNOSIS — D492 Neoplasm of unspecified behavior of bone, soft tissue, and skin: Secondary | ICD-10-CM | POA: Diagnosis not present

## 2022-05-29 DIAGNOSIS — L570 Actinic keratosis: Secondary | ICD-10-CM | POA: Diagnosis not present

## 2022-05-29 DIAGNOSIS — D225 Melanocytic nevi of trunk: Secondary | ICD-10-CM | POA: Diagnosis not present

## 2022-05-29 DIAGNOSIS — L821 Other seborrheic keratosis: Secondary | ICD-10-CM | POA: Diagnosis not present

## 2022-05-29 DIAGNOSIS — C44311 Basal cell carcinoma of skin of nose: Secondary | ICD-10-CM | POA: Diagnosis not present

## 2022-05-29 DIAGNOSIS — L814 Other melanin hyperpigmentation: Secondary | ICD-10-CM | POA: Diagnosis not present

## 2022-06-17 ENCOUNTER — Encounter: Payer: Self-pay | Admitting: Family Medicine

## 2022-06-17 ENCOUNTER — Ambulatory Visit (INDEPENDENT_AMBULATORY_CARE_PROVIDER_SITE_OTHER): Payer: Medicare HMO | Admitting: Family Medicine

## 2022-06-17 VITALS — BP 114/62 | HR 69 | Temp 98.0°F | Ht 65.0 in | Wt 141.2 lb

## 2022-06-17 DIAGNOSIS — I2510 Atherosclerotic heart disease of native coronary artery without angina pectoris: Secondary | ICD-10-CM

## 2022-06-17 DIAGNOSIS — E785 Hyperlipidemia, unspecified: Secondary | ICD-10-CM

## 2022-06-17 DIAGNOSIS — R739 Hyperglycemia, unspecified: Secondary | ICD-10-CM

## 2022-06-17 DIAGNOSIS — M810 Age-related osteoporosis without current pathological fracture: Secondary | ICD-10-CM | POA: Diagnosis not present

## 2022-06-17 DIAGNOSIS — I10 Essential (primary) hypertension: Secondary | ICD-10-CM | POA: Diagnosis not present

## 2022-07-01 DIAGNOSIS — Z01419 Encounter for gynecological examination (general) (routine) without abnormal findings: Secondary | ICD-10-CM | POA: Diagnosis not present

## 2022-07-01 DIAGNOSIS — Z6824 Body mass index (BMI) 24.0-24.9, adult: Secondary | ICD-10-CM | POA: Diagnosis not present

## 2022-07-01 DIAGNOSIS — K219 Gastro-esophageal reflux disease without esophagitis: Secondary | ICD-10-CM | POA: Diagnosis not present

## 2022-07-01 DIAGNOSIS — R29890 Loss of height: Secondary | ICD-10-CM | POA: Diagnosis not present

## 2022-07-01 DIAGNOSIS — N958 Other specified menopausal and perimenopausal disorders: Secondary | ICD-10-CM | POA: Diagnosis not present

## 2022-07-01 DIAGNOSIS — Z1231 Encounter for screening mammogram for malignant neoplasm of breast: Secondary | ICD-10-CM | POA: Diagnosis not present

## 2022-07-01 DIAGNOSIS — M816 Localized osteoporosis [Lequesne]: Secondary | ICD-10-CM | POA: Diagnosis not present

## 2022-07-08 ENCOUNTER — Other Ambulatory Visit: Payer: Self-pay | Admitting: Family Medicine

## 2022-07-10 DIAGNOSIS — M810 Age-related osteoporosis without current pathological fracture: Secondary | ICD-10-CM | POA: Diagnosis not present

## 2022-07-16 DIAGNOSIS — N810 Urethrocele: Secondary | ICD-10-CM | POA: Diagnosis not present

## 2022-07-25 ENCOUNTER — Ambulatory Visit: Payer: Medicare HMO

## 2022-07-28 ENCOUNTER — Other Ambulatory Visit: Payer: Self-pay | Admitting: Family Medicine

## 2022-08-15 DIAGNOSIS — C44311 Basal cell carcinoma of skin of nose: Secondary | ICD-10-CM | POA: Diagnosis not present

## 2022-09-02 ENCOUNTER — Telehealth: Payer: Self-pay | Admitting: Family Medicine

## 2022-09-02 NOTE — Telephone Encounter (Signed)
Spoke with patient to sched her AWV she stated she [facility]

## 2022-09-16 ENCOUNTER — Encounter: Payer: Self-pay | Admitting: *Deleted

## 2022-09-27 ENCOUNTER — Ambulatory Visit (INDEPENDENT_AMBULATORY_CARE_PROVIDER_SITE_OTHER): Payer: Medicare HMO | Admitting: *Deleted

## 2022-09-27 DIAGNOSIS — Z23 Encounter for immunization: Secondary | ICD-10-CM

## 2022-09-30 ENCOUNTER — Telehealth: Payer: Self-pay | Admitting: Family Medicine

## 2022-09-30 NOTE — Telephone Encounter (Signed)
Patient WCB to sched her and spouses AWV

## 2022-10-20 ENCOUNTER — Other Ambulatory Visit: Payer: Self-pay | Admitting: Family Medicine

## 2022-10-28 DIAGNOSIS — R519 Headache, unspecified: Secondary | ICD-10-CM | POA: Diagnosis not present

## 2022-10-28 DIAGNOSIS — J029 Acute pharyngitis, unspecified: Secondary | ICD-10-CM | POA: Diagnosis not present

## 2022-10-28 DIAGNOSIS — R509 Fever, unspecified: Secondary | ICD-10-CM | POA: Diagnosis not present

## 2022-11-19 ENCOUNTER — Other Ambulatory Visit: Payer: Self-pay | Admitting: Family Medicine

## 2022-11-28 DIAGNOSIS — C44529 Squamous cell carcinoma of skin of other part of trunk: Secondary | ICD-10-CM | POA: Diagnosis not present

## 2022-11-28 DIAGNOSIS — D492 Neoplasm of unspecified behavior of bone, soft tissue, and skin: Secondary | ICD-10-CM | POA: Diagnosis not present

## 2022-11-28 DIAGNOSIS — L814 Other melanin hyperpigmentation: Secondary | ICD-10-CM | POA: Diagnosis not present

## 2022-11-28 DIAGNOSIS — L570 Actinic keratosis: Secondary | ICD-10-CM | POA: Diagnosis not present

## 2022-11-28 DIAGNOSIS — C44722 Squamous cell carcinoma of skin of right lower limb, including hip: Secondary | ICD-10-CM | POA: Diagnosis not present

## 2022-11-28 DIAGNOSIS — L821 Other seborrheic keratosis: Secondary | ICD-10-CM | POA: Diagnosis not present

## 2022-11-28 DIAGNOSIS — D225 Melanocytic nevi of trunk: Secondary | ICD-10-CM | POA: Diagnosis not present

## 2022-12-24 DIAGNOSIS — L570 Actinic keratosis: Secondary | ICD-10-CM | POA: Diagnosis not present

## 2022-12-24 DIAGNOSIS — D485 Neoplasm of uncertain behavior of skin: Secondary | ICD-10-CM | POA: Diagnosis not present

## 2022-12-24 DIAGNOSIS — C44529 Squamous cell carcinoma of skin of other part of trunk: Secondary | ICD-10-CM | POA: Diagnosis not present

## 2022-12-26 ENCOUNTER — Ambulatory Visit (INDEPENDENT_AMBULATORY_CARE_PROVIDER_SITE_OTHER): Payer: Medicare HMO | Admitting: Family Medicine

## 2022-12-26 ENCOUNTER — Encounter: Payer: Self-pay | Admitting: Family Medicine

## 2022-12-26 VITALS — BP 122/70 | HR 74 | Temp 97.9°F | Ht 65.0 in | Wt 142.0 lb

## 2022-12-26 DIAGNOSIS — E785 Hyperlipidemia, unspecified: Secondary | ICD-10-CM | POA: Diagnosis not present

## 2022-12-26 DIAGNOSIS — Z0000 Encounter for general adult medical examination without abnormal findings: Secondary | ICD-10-CM

## 2022-12-26 DIAGNOSIS — R739 Hyperglycemia, unspecified: Secondary | ICD-10-CM

## 2022-12-26 LAB — CBC WITH DIFFERENTIAL/PLATELET
Basophils Absolute: 0.1 10*3/uL (ref 0.0–0.1)
Basophils Relative: 0.7 % (ref 0.0–3.0)
Eosinophils Absolute: 0.1 10*3/uL (ref 0.0–0.7)
Eosinophils Relative: 0.8 % (ref 0.0–5.0)
HCT: 41.2 % (ref 36.0–46.0)
Hemoglobin: 13.6 g/dL (ref 12.0–15.0)
Lymphocytes Relative: 29.1 % (ref 12.0–46.0)
Lymphs Abs: 2.9 10*3/uL (ref 0.7–4.0)
MCHC: 32.9 g/dL (ref 30.0–36.0)
MCV: 92.7 fl (ref 78.0–100.0)
Monocytes Absolute: 0.7 10*3/uL (ref 0.1–1.0)
Monocytes Relative: 7.2 % (ref 3.0–12.0)
Neutro Abs: 6.2 10*3/uL (ref 1.4–7.7)
Neutrophils Relative %: 62.2 % (ref 43.0–77.0)
Platelets: 276 10*3/uL (ref 150.0–400.0)
RBC: 4.45 Mil/uL (ref 3.87–5.11)
RDW: 14.6 % (ref 11.5–15.5)
WBC: 9.9 10*3/uL (ref 4.0–10.5)

## 2022-12-26 LAB — LIPID PANEL
Cholesterol: 157 mg/dL (ref 0–200)
HDL: 70.1 mg/dL (ref >39.00)
LDL Cholesterol: 70 mg/dL (ref 0–99)
NonHDL: 87.02
Total CHOL/HDL Ratio: 2
Triglycerides: 86 mg/dL (ref 0.0–149.0)
VLDL: 17.2 mg/dL (ref 0.0–40.0)

## 2022-12-26 LAB — COMPREHENSIVE METABOLIC PANEL
ALT: 16 U/L (ref 0–35)
AST: 21 U/L (ref 0–37)
Albumin: 4.2 g/dL (ref 3.5–5.2)
Alkaline Phosphatase: 82 U/L (ref 39–117)
BUN: 19 mg/dL (ref 6–23)
CO2: 29 mEq/L (ref 19–32)
Calcium: 9.9 mg/dL (ref 8.4–10.5)
Chloride: 104 mEq/L (ref 96–112)
Creatinine, Ser: 0.87 mg/dL (ref 0.40–1.20)
GFR: 62.09 mL/min (ref >60.00)
Glucose, Bld: 92 mg/dL (ref 70–99)
Potassium: 4.1 mEq/L (ref 3.5–5.1)
Sodium: 141 mEq/L (ref 135–145)
Total Bilirubin: 0.4 mg/dL (ref 0.2–1.2)
Total Protein: 6.7 g/dL (ref 6.0–8.3)

## 2022-12-26 LAB — HEMOGLOBIN A1C: Hgb A1c MFr Bld: 6 % (ref 4.6–6.5)

## 2022-12-26 NOTE — Patient Instructions (Addendum)
Health Maintenance Due  Topic Date Due   Medicare Annual Wellness (AWV)  07/13/2022  You are eligible to schedule your annual wellness visit with our nurse specialist Otila Kluver.  Please consider scheduling this before you leave today  Please stop by lab before you go If you have mychart- we will send your results within 3 business days of [place] receiving them.  If you do not have mychart- we will call you about results within 5 business days of [place] receiving them.  *please also note that you will see labs on mychart as soon as they post. I will later go in and write notes on them- will say "notes from Dr. Yong Channel"   Goal 150 minutes exercise per week- even starting 5-10 minutes a way (have to find a way when its cold!)  Recommended follow up: Return in about 6 months (around 06/26/2023) for followup or sooner if needed.Schedule b4 you leave.

## 2022-12-26 NOTE — Progress Notes (Signed)
Phone [phone]   Subjective:  Patient presents today for their annual physical. Chief complaint-noted.   See problem oriented charting- ROS- full  review of systems was completed and negative Per full ROS sheet completed by patient  The following were reviewed and entered/updated in epic: Past Medical History:  Diagnosis Date   ALLERGIC RHINITIS 04/08/2008   Allergy    Basal cell carcinoma    back   CAD (coronary artery disease) 2008   status post anterior myocardial infarction, treated w/ a dru-eluting stent to the LAD   CAD, NATIVE VESSEL 11/10/2009   GERD (gastroesophageal reflux disease)    Hiatal hernia    HYPERLIPIDEMIA 03/29/2008   HYPERTENSION 03/29/2008   Myocardial infarction ([place])    2008   Osteopenia    Patient Active Problem List   Diagnosis Date Noted   CAD (coronary artery disease) 11/10/2009    Priority: High   Hyperglycemia, unspecified 05/11/2020    Priority: Medium    CKD (chronic kidney disease), stage III ([place]) 05/11/2020    Priority: Medium    Osteoporosis     Priority: Medium    Hyperlipidemia 03/29/2008    Priority: Medium    Essential hypertension 03/29/2008    Priority: Medium    S/P laparoscopic assisted vaginal hysterectomy (LAVH) 07/20/2018    Priority: Low   History of basal cell carcinoma of skin     Priority: Low   GERD 05/05/2008    Priority: Low   Allergic rhinitis 04/08/2008    Priority: Low   Left hip pain 02/10/2019    Priority: 1.   Left shoulder pain 02/10/2019    Priority: 1.   Chronic pain of right knee 10/12/2017    Priority: 1.   Past Surgical History:  Procedure Laterality Date   ANTERIOR AND POSTERIOR REPAIR WITH SACROSPINOUS FIXATION N/A 07/20/2018   Procedure: ANTERIOR AND POSTERIOR REPAIR WITH SACROSPINOUS FIXATION;  Surgeon: Arvella Nigh, MD;  Location: [facility];  Service: Gynecology;  Laterality: N/A;   APPENDECTOMY     BASAL CELL CARCINOMA EXCISION     back   BLADDER SUSPENSION N/A 07/20/2018   Procedure:  TRANSOBTURATOR Margarita Grizzle;  Surgeon: Arvella Nigh, MD;  Location: [facility];  Service: Gynecology;  Laterality: N/A;   LAPAROSCOPIC ASSISTED VAGINAL HYSTERECTOMY Bilateral    LAPAROSCOPIC VAGINAL HYSTERECTOMY WITH SALPINGO OOPHORECTOMY Bilateral 07/20/2018   Procedure: LAPAROSCOPIC ASSISTED VAGINAL HYSTERECTOMY WITH SALPINGO OOPHORECTOMY;  Surgeon: Arvella Nigh, MD;  Location: [facility];  Service: Gynecology;  Laterality: Bilateral;   Percutaneous transluminal coronary angioplasty and drug-eluting  09/2007   Stent placement in the left anterior descending   WISDOM TOOTH EXTRACTION      Family History  Problem Relation Age of Onset   Coronary artery disease Mother    Lung cancer Father    Heart disease Brother        stent placed   Coronary artery disease Maternal Uncle    CAD Son        died 47 of MI   Colon cancer Neg Hx    Esophageal cancer Neg Hx    Rectal cancer Neg Hx    Stomach cancer Neg Hx     Medications- reviewed and updated Current Outpatient Medications  Medication Sig Dispense Refill   acetaminophen (TYLENOL) 500 MG tablet Take 1,000 mg by mouth 2 (two) times daily as needed for moderate pain or headache.     aspirin 81 MG tablet Take 81 mg by mouth every evening.  atorvastatin (LIPITOR) 80 MG tablet TAKE 1 TABLET BY MOUTH ONCE DAILY AS DIRECTED 90 tablet 0   diclofenac sodium (VOLTAREN) 1 % GEL Apply 2 g topically 3 (three) times daily as needed. 100 g 5   ezetimibe (ZETIA) 10 MG tablet Take 1 tablet by mouth once daily 90 tablet 0   nitroGLYCERIN (NITROSTAT) 0.4 MG SL tablet Place 1 tablet (0.4 mg total) under the tongue every 5 (five) minutes as needed for chest pain (call 911 if no relief after 2nd dose). 25 tablet 2   No current facility-administered medications for this visit.    Allergies-reviewed and updated Allergies  Allergen Reactions   Penicillins Rash    Has patient had a PCN reaction causing immediate rash, facial/tongue/throat swelling, SOB or  lightheadedness with hypotension: Yes Has patient had a PCN reaction causing severe rash involving mucus membranes or skin necrosis: No Has patient had a PCN reaction that required hospitalization: No Has patient had a PCN reaction occurring within the last 10 years: No If all of the above answers are "NO", then may proceed with Cephalosporin use.     Social History   Social History Narrative   Married 1970. 2 living children. 2 living children- had 4 (lost one at age 19 Johnathon drug related to heart attack and one at 10 Mark to heart attack not drug related), 6 grandkids. 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 Oropharynx normal Neck: no thyromegaly CV: RRR no murmurs rubs or gallops Lungs: CTAB no crackles, wheeze, rhonchi Abdomen: soft/nontender/nondistended/normal bowel sounds. No rebound or guarding.  Ext: no edema Skin: warm, dry Neuro: grossly normal, moves all extremities, PERRLA   Assessment and Plan   82 y.o. female presenting for annual physical.  Health Maintenance counseling: 1. Anticipatory guidance: Patient counseled regarding regular dental exams - advised q6 months, eye exams - yearly with dry eyes- may need cataract surgery,  avoiding smoking and second hand smoke , limiting alcohol to 1 beverage per day , no illicit drugs .   2. Risk factor reduction:  Advised patient of need for regular exercise and diet rich and fruits and vegetables to reduce risk of heart attack and stroke.  Exercise- not doing well- encouraged her to work on this.  Diet/weight management-weight within 2 pounds of last year- eats reasonably healthy.  Wt Readings from Last 3 Encounters:   12/26/22 142 lb (64.4 kg)  06/17/22 141 lb 3.2 oz (64 kg)  04/25/22 143 lb (64.9 kg)  3. Immunizations/screenings/ancillary studies-immunizations fully up-to-date  Immunization History  Administered Date(s) Administered   Fluad Quad(high Dose 65+) 08/25/2019, 11/16/2020, 09/18/2021, 09/27/2022   Influenza Split 09/27/2011, 10/13/2012   Influenza Whole 09/29/2008, 09/27/2009, 10/04/2010   Influenza, High Dose Seasonal PF 09/24/2013, 10/04/2014, 09/28/2015, 09/27/2016, 08/28/2017, 09/21/2018   PFIZER Comirnaty(Gray Top)Covid-19 Tri-Sucrose Vaccine 09/26/2022   PFIZER(Purple Top)SARS-COV-2 Vaccination 01/08/2020, 01/29/2020, 10/17/2020, 03/27/2021   Pfizer Covid-19 Vaccine Bivalent Booster 45yr & up 11/19/2021   Pneumococcal Conjugate-13 12/02/2014   Pneumococcal Polysaccharide-23 03/29/2008   Pneumococcal-Unspecified 03/05/2021   Respiratory Syncytial Virus Vaccine,Recomb Aduvanted(Arexvy) 09/26/2022  Td 03/29/2008   Tdap 12/02/2014   Zoster Recombinat (Shingrix) 05/26/2018, 08/25/2018   Zoster, Live 02/23/2008  4. Cervical cancer screening- still follows up with gynecology-never had abnormal Pap smear.  Follow Dr. Barnie Mort in the past  5. Breast cancer screening-  breast exam with gynecology and mammogram - continues to get mammograms but past age based screening recommendations   6. Colon cancer screening - never had abnormal colonoscopy.  We tried to do a final Cologuard 3 years ago but was not covered so we have deferred  7. Skin cancer screening- Follows with Dr. Ledell Peoples office every 6 months- has had to have Mohs on nose.  8. Birth control/STD check- monogamous and postmenopausal  9. Osteoporosis screening at 38- follows with gynecology physician for women's - see below 10. Smoking associated screening - never  smoker  Status of chronic or acute concerns   #CAD/hyperlipidemia- Sees Dr. Burt Knack S: Medication: atorvastatin '80mg'$ , Zetia '10mg'$ .  Aspirin 81 mg.   Nitroglycerin available as needed- not needing  In regards to CAD-no chest pain or sob  Lab Results  Component Value Date   CHOL 126 12/13/2021   HDL 58.60 12/13/2021   LDLCALC 55 12/13/2021   LDLDIRECT 57.0 11/11/2018   TRIG 64.0 12/13/2021   CHOLHDL 2 12/13/2021  A/P: cad asymptomatic- continue current medications, lipids at goal last check- update with labs  #hypertension-blood pressure tends to run higher in office S: medication: off losartan now -previously Losartan '25mg'$  down from '100mg'$  due to orthostatic symptoms -hctz stopped 2021 due to lightheadedness, later stopped coreg 25 mg BID (bradycardia)  BP Readings from Last 3 Encounters:  12/26/22 122/70  06/17/22 114/62  04/25/22 (!) 140/50   A/P: stable without meds- monitor  # GERD S: Medication: Pepcid '20mg'$  in the past-in 2022 not having to use A/P: not always needing- continue to monitor   # Hyperglycemia/insulin resistance/prediabetes-peak A1c 6.24 October 2019 S:  Medication: none Exercise and diet- not exercising   Lab Results  Component Value Date   HGBA1C 6.1 12/13/2021   HGBA1C 5.7 (H) 11/14/2020   HGBA1C 6.2 05/10/2020  A/P: hopefully stable or improved- update a1c today. Continue current meds for now  % Osteoporosis-[facility] by Dr. Radene Knee [facility].  Previously on Boniva but developed worsening reflux symptoms- on prolia.  She is compliant with calcium and vitamin D   Recommended follow up: Return in about 6 months (around 06/26/2023) for followup or sooner if needed.Schedule b4 you leave.  Lab/Order associations: fasting   ICD-10-CM   1. Preventative health care  Z00.00     2. Hyperlipidemia, unspecified hyperlipidemia type  E78.5 CBC with Differential/Platelet    Comprehensive metabolic panel    Lipid panel    3. Hyperglycemia, unspecified  R73.9 HgB A1c     No orders of the defined types were placed in this encounter.  Return precautions advised.  Garret Reddish, MD

## 2023-01-14 DIAGNOSIS — M810 Age-related osteoporosis without current pathological fracture: Secondary | ICD-10-CM | POA: Diagnosis not present

## 2023-01-16 DIAGNOSIS — L570 Actinic keratosis: Secondary | ICD-10-CM | POA: Diagnosis not present

## 2023-01-16 DIAGNOSIS — D0471 Carcinoma in situ of skin of right lower limb, including hip: Secondary | ICD-10-CM | POA: Diagnosis not present

## 2023-01-16 DIAGNOSIS — L578 Other skin changes due to chronic exposure to nonionizing radiation: Secondary | ICD-10-CM | POA: Diagnosis not present

## 2023-01-27 DIAGNOSIS — M810 Age-related osteoporosis without current pathological fracture: Secondary | ICD-10-CM | POA: Diagnosis not present

## 2023-02-27 DIAGNOSIS — L905 Scar conditions and fibrosis of skin: Secondary | ICD-10-CM | POA: Diagnosis not present

## 2023-02-27 DIAGNOSIS — L578 Other skin changes due to chronic exposure to nonionizing radiation: Secondary | ICD-10-CM | POA: Diagnosis not present

## 2023-02-27 DIAGNOSIS — Z86007 Personal history of in-situ neoplasm of skin: Secondary | ICD-10-CM | POA: Diagnosis not present

## 2023-02-27 DIAGNOSIS — L570 Actinic keratosis: Secondary | ICD-10-CM | POA: Diagnosis not present

## 2023-03-06 ENCOUNTER — Other Ambulatory Visit: Payer: Self-pay | Admitting: Family Medicine

## 2023-03-31 ENCOUNTER — Other Ambulatory Visit: Payer: Self-pay | Admitting: Family Medicine

## 2023-04-08 NOTE — Progress Notes (Signed)
Cardiology Office Note:    Date:  04/09/2023   ID:  Jennifer Gross, DOB [birth year], MRN [id number]  PCP:  Shelva Majestic, MD   [facility] Providers Cardiologist:  Tonny Bollman, MD    Referring MD: Shelva Majestic, MD   Chief Complaint  Patient presents with   Follow-up    CAD     History of Present Illness:    Jennifer Gross is a 82 y.o. female with a hx of coronary artery disease, HTN, GERD, CKD. Patient is followed by Dr. Excell Seltzer and presents today for their annual follow up.   Per chart review, patient initially presented in 2008 for an anterior MI.  This was treated with overlapping drug-eluting stents in the LAD as well as balloon angioplasty of the first diagonal branch.  Since then, she has been followed by Dr. Excell Seltzer and has done very well from a cardiovascular perspective.   Patient was last seen by Dr. Excell Seltzer on 03/22/2022.  At that time, patient complained of some atypical chest discomfort.  With known history of remote LAD stenting, it was recommended that she have a nuclear stress test for further evaluation.  Nuclear stress test on 04/15/2022 was a normal, low risk study.  Echocardiogram on 04/15/2022 showed EF of 65-70%, no regional wall motion abnormalities, normal RV systolic function, mild mitral valve regurgitation.  Today, patient reports that she has been doing very well from a cardiac perspective.  Last year when she saw Dr. Excell Seltzer, she did complain of some atypical chest discomfort that felt like acid reflux.  She explains that at that time, she had been taking alendronate for her bone density.  Dr. Excell Seltzer did send her for stress test which was normal.  She did stop taking the alendronate and has not had issues with chest pain since.  Denies shortness of breath on exertion, dizziness, syncope, near syncope, palpitations, ankle edema.  She checks her blood pressure regularly at home and reports that her systolic blood pressure is often in the  120s-130s   Past Medical History:  Diagnosis Date   ALLERGIC RHINITIS 04/08/2008   Allergy    Basal cell carcinoma    back   CAD (coronary artery disease) 2008   status post anterior myocardial infarction, treated w/ a dru-eluting stent to the LAD   CAD, NATIVE VESSEL 11/10/2009   GERD (gastroesophageal reflux disease)    Hiatal hernia    HYPERLIPIDEMIA 03/29/2008   HYPERTENSION 03/29/2008   Myocardial infarction    2008   Osteopenia     Past Surgical History:  Procedure Laterality Date   ANTERIOR AND POSTERIOR REPAIR WITH SACROSPINOUS FIXATION N/A 07/20/2018   Procedure: ANTERIOR AND POSTERIOR REPAIR WITH SACROSPINOUS FIXATION;  Surgeon: Richardean Chimera, MD;  Location: [facility];  Service: Gynecology;  Laterality: N/A;   APPENDECTOMY     BASAL CELL CARCINOMA EXCISION     back   BLADDER SUSPENSION N/A 07/20/2018   Procedure: TRANSOBTURATOR Marella Chimes;  Surgeon: Richardean Chimera, MD;  Location: [facility];  Service: Gynecology;  Laterality: N/A;   LAPAROSCOPIC ASSISTED VAGINAL HYSTERECTOMY Bilateral    LAPAROSCOPIC VAGINAL HYSTERECTOMY WITH SALPINGO OOPHORECTOMY Bilateral 07/20/2018   Procedure: LAPAROSCOPIC ASSISTED VAGINAL HYSTERECTOMY WITH SALPINGO OOPHORECTOMY;  Surgeon: Richardean Chimera, MD;  Location: [facility];  Service: Gynecology;  Laterality: Bilateral;   Percutaneous transluminal coronary angioplasty and drug-eluting  09/2007   Stent placement in the left anterior descending   WISDOM TOOTH EXTRACTION      Current Medications:  Current Meds  Medication Sig   acetaminophen (TYLENOL) 500 MG tablet Take 1,000 mg by mouth 2 (two) times daily as needed for moderate pain or headache.   aspirin 81 MG tablet Take 81 mg by mouth every evening.   atorvastatin (LIPITOR) 80 MG tablet TAKE 1 TABLET BY MOUTH ONCE DAILY AS DIRECTED   diclofenac sodium (VOLTAREN) 1 % GEL Apply 2 g topically 3 (three) times daily as needed.   ezetimibe (ZETIA) 10 MG tablet Take 1 tablet by mouth once daily   nitroGLYCERIN  (NITROSTAT) 0.4 MG SL tablet Place 1 tablet (0.4 mg total) under the tongue every 5 (five) minutes as needed for chest pain (call 911 if no relief after 2nd dose).     Allergies:   Penicillins   Social History   Socioeconomic History   Marital status: Married    Spouse name: Not on file   Number of children: 4   Years of education: Not on file   Highest education level: Not on file  Occupational History   Occupation: retired  Tobacco Use   Smoking status: Never   Smokeless tobacco: Never  Vaping Use   Vaping Use: Never used  Substance and Sexual Activity   Alcohol use: No   Drug use: No   Sexual activity: Not on file  Other Topics Concern   Not on file  Social History Narrative   Married 1970. 2 living children. 2 living children- had 4 (lost one at age 50 [place] drug related to heart attack and one at 89 [place] to heart attack not drug related), 6 grandkids. 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, Microbiologist, Doctor, hospital      Hobbies: cooking   Social Determinants of Health   Financial Resource Strain: Low Risk  (07/13/2021)   Overall Financial Resource Strain (CARDIA)    Difficulty of Paying Living Expenses: Not hard at all  Food Insecurity: No Food Insecurity (07/13/2021)   Hunger Vital Sign    Worried About Running Out of Food in the Last Year: Never true    Ran Out of Food in the Last Year: Never true  Transportation Needs: No Transportation Needs (07/13/2021)   PRAPARE - Administrator, Civil Service (Medical): No    Lack of Transportation (Non-Medical): No  Physical Activity: Insufficiently Active (07/13/2021)   Exercise Vital Sign    Days of Exercise per Week: 3 days    Minutes of Exercise per Session: 30 min  Stress: No Stress Concern Present (07/13/2021)   [employer] of Occupational Health - Occupational Stress Questionnaire    Feeling of Stress : Not at all  Social Connections: Moderately  Integrated (07/13/2021)   Social Connection and Isolation Panel [NHANES]    Frequency of Communication with Friends and Family: More than three times a week    Frequency of Social Gatherings with Friends and Family: More than three times a week    Attends Religious Services: More than 4 times per year    Active Member of [employer] or Organizations: No    Attends Engineer, structural: Never    Marital Status: Married     Family History: The patient's family history includes CAD in her son; Coronary artery disease in her maternal uncle and mother; Heart disease in her brother; Lung cancer in her father. There is no history of Colon cancer, Esophageal cancer, Rectal cancer, or Stomach cancer.  ROS:   Please see the history of present illness.     All other systems reviewed and are negative.  EKGs/Labs/Other Studies Reviewed:    The following studies were reviewed today: Cardiac Studies & Procedures     STRESS TESTS  MYOCARDIAL PERFUSION IMAGING 04/15/2022  Narrative   The study is normal. The study is low risk.   No ST deviation was noted.   LV perfusion is normal.   Left ventricular function is normal. Nuclear stress EF: 79 %. The left ventricular ejection fraction is hyperdynamic (>65%). End diastolic cavity size is normal.   Prior study not available for comparison.   ECHOCARDIOGRAM  ECHOCARDIOGRAM COMPLETE 04/15/2022  Narrative ECHOCARDIOGRAM REPORT    Patient Name:   SHENELL LEITH Date of Exam: 04/15/2022 Medical Rec #:  [id number]       Height:       65.0 in Accession #:    [id number]      Weight:       143.2 lb Date of Birth:  1941-05-18        BSA:          1.716 m Patient Age:    80 years        BP:           203/93 mmHg Patient Gender: F               HR:           66 bpm. Exam Location:  [address]  Procedure: 2D Echo, 3D Echo, Cardiac Doppler and Color Doppler  Indications:    R55 Syncope and collapse  History:        Patient has prior history of  Echocardiogram examinations, most recent 12/14/2007. Previous Myocardial Infarction and CAD; Risk Factors:Hypertension and HLD.  Sonographer:    Clearence Ped RCS Referring Phys: [phone] MICHAEL COOPER  IMPRESSIONS   1. Left ventricular ejection fraction, by estimation, is 65 to 70%. The left ventricle has normal function. The left ventricle has no regional wall motion abnormalities. Left ventricular diastolic parameters were normal. 2. Right ventricular systolic function is normal. The right ventricular size is normal. There is normal pulmonary artery systolic pressure. 3. Mild mitral valve regurgitation. 4. The aortic valve is tricuspid. Aortic valve regurgitation is not visualized. 5. The inferior vena cava is normal in size with greater than 50% respiratory variability, suggesting right atrial pressure of 3 mmHg.  FINDINGS Left Ventricle: Left ventricular ejection fraction, by estimation, is 65 to 70%. The left ventricle has normal function. The left ventricle has no regional wall motion abnormalities. The left ventricular internal cavity size was normal in size. There is no left ventricular hypertrophy. Left ventricular diastolic parameters were normal.  Right Ventricle: The right ventricular size is normal. Right vetricular wall thickness was not assessed. Right ventricular systolic function is normal. There is normal pulmonary artery systolic pressure. The tricuspid regurgitant velocity is 1.43 m/s, and with an assumed right atrial pressure of 3 mmHg, the estimated right ventricular systolic pressure is 11.2 mmHg.  Left Atrium: Left atrial size was normal in size.  Right Atrium: Right atrial size was normal in size.  Pericardium: There is no evidence of pericardial effusion.  Mitral Valve: There is mild thickening of the mitral valve leaflet(s). Mild mitral valve regurgitation.  Tricuspid Valve: The tricuspid valve is normal in structure. Tricuspid valve regurgitation is  mild.  Aortic Valve: The aortic valve is tricuspid. Aortic valve regurgitation is not visualized.  Pulmonic  Valve: The pulmonic valve was normal in structure. Pulmonic valve regurgitation is not visualized.  Aorta: The aortic root is normal in size and structure.  Venous: The inferior vena cava is normal in size with greater than 50% respiratory variability, suggesting right atrial pressure of 3 mmHg.  IAS/Shunts: No atrial level shunt detected by color flow Doppler.   LEFT VENTRICLE PLAX 2D LVIDd:         4.00 cm   Diastology LVIDs:         2.70 cm   LV e' medial:    10.00 cm/s LV PW:         0.70 cm   LV E/e' medial:  11.4 LV IVS:        0.80 cm   LV e' lateral:   8.27 cm/s LVOT diam:     1.90 cm   LV E/e' lateral: 13.8 LV SV:         90 LV SV Index:   53 LVOT Area:     2.84 cm  3D Volume EF: 3D EF:        71 % LV EDV:       95 ml LV ESV:       27 ml LV SV:        67 ml  RIGHT VENTRICLE RV Basal diam:  3.50 cm RV S prime:     16.30 cm/s TAPSE (M-mode): 2.7 cm RVSP:           11.2 mmHg  LEFT ATRIUM             Index        RIGHT ATRIUM           Index LA diam:        2.80 cm 1.63 cm/m   RA Pressure: 3.00 mmHg LA Vol (A2C):   37.3 ml 21.73 ml/m  RA Area:     14.40 cm LA Vol (A4C):   27.5 ml 16.02 ml/m  RA Volume:   34.90 ml  20.33 ml/m LA Biplane Vol: 34.0 ml 19.81 ml/m AORTIC VALVE LVOT Vmax:   133.00 cm/s LVOT Vmean:  92.300 cm/s LVOT VTI:    0.318 m  AORTA Ao Root diam: 3.30 cm Ao Asc diam:  3.10 cm  MITRAL VALVE                TRICUSPID VALVE MV Area (PHT):              TR Peak grad:   8.2 mmHg MV Decel Time:              TR Vmax:        143.00 cm/s MV E velocity: 114.00 cm/s  Estimated RAP:  3.00 mmHg MV A velocity: 114.00 cm/s  RVSP:           11.2 mmHg MV E/A ratio:  1.00 SHUNTS Systemic VTI:  0.32 m Systemic Diam: 1.90 cm  Dietrich Pates MD Electronically signed by Dietrich Pates MD Signature Date/Time: 04/15/2022/2:10:49 PM    Final               EKG:  EKG is ordered today.  The ekg ordered today demonstrates normal sinus rhythm with HR 75 BPM. No changes compared to previous EKG from 03/2022   Recent Labs: 12/26/2022: ALT 16; BUN 19; Creatinine, Ser 0.87; Hemoglobin 13.6; Platelets 276.0; Potassium 4.1; Sodium 141  Recent Lipid Panel    Component Value Date/Time   CHOL 157 12/26/2022  1404   TRIG 86.0 12/26/2022 1404   HDL 70.10 12/26/2022 1404   CHOLHDL 2 12/26/2022 1404   VLDL 17.2 12/26/2022 1404   LDLCALC 70 12/26/2022 1404   LDLCALC 53 11/14/2020 1543   LDLDIRECT 57.0 11/11/2018 1051     Risk Assessment/Calculations:      HYPERTENSION CONTROL Vitals:   04/09/23 1020 04/09/23 1052  BP: (!) 161/5 (!) 142/78    The patient's blood pressure is elevated above target today.  In order to address the patient's elevated BP: The blood pressure is usually elevated in clinic.  Blood pressures monitored at home have been optimal.            Physical Exam:    VS:  BP (!) 142/78   Pulse 75   Ht 5\' 5"  (1.651 m)   Wt 139 lb 9.6 oz (63.3 kg)   SpO2 95%   BMI 23.23 kg/m     Wt Readings from Last 3 Encounters:  04/09/23 139 lb 9.6 oz (63.3 kg)  12/26/22 142 lb (64.4 kg)  06/17/22 141 lb 3.2 oz (64 kg)     GEN: Elderly female sitting comfortably on the exam table. Well nourished, well developed in no acute distress HEENT: Normal NECK: No JVD; No carotid bruits CARDIAC: RRR, no murmurs, rubs, gallops. Radial pulses 2+ bilaterally  RESPIRATORY:  Clear to auscultation without rales, wheezing or rhonchi. Normal work of breathing on room air   ABDOMEN: Soft, non-tender, non-distended MUSCULOSKELETAL:  No edema in BLE; No deformity  SKIN: Warm and dry NEUROLOGIC:  Alert and oriented x 3 PSYCHIATRIC:  Normal affect   ASSESSMENT:    1. Coronary artery disease involving native coronary artery of native heart without angina pectoris   2. Essential hypertension   3. Mixed hyperlipidemia   4. Syncope and collapse    5. Mild mitral valve regurgitation    PLAN:    In order of problems listed above:  Coronary Artery Disease  - In 2008, patient had an anterior MI and was treated with overlapping DES in the LAD and balloon angioplasty of the first diagonal branch - Most recent ischemic evaluation from 03/2022 was a nuclear stress test that was a normal study without evidence of ischemia  - Today, patient denies chest pain or dyspnea on exertion - Continue ASA, lipitor, zetia  - Patient is not on a beta-blocker given history of orthostatic hypotension   HTN  - Patient is not on any blood pressure medications - She monitors her blood pressure at home and reports that her systolic blood pressure is in the 120s-130s.  BP is a bit elevated in office today, but patient reports a history of While Coat HTN  - OK to remain off blood pressure medications since BP at home is optimal   HLD  - Lipid panel from 12/2022 showed LDL 70, HDL 70, triglycerides 86, total cholesterol 157  - Continue lipitor 80 mg daily and zetia 10 mg daily   History of syncope  - Patient previously had an episode of syncope that was likely due to orthostatic hypotension  - She was taken off antihypertensive medications, and her symptoms have resolved. She denies recurrence of dizziness, lightheadedness, syncope, or near syncope since stopping BP medications  - Echocardiogram from 03/2022 showed EF 65-70%, no regional wall motion abnormalities, normal RV systolic function, mild mitral valve regurgitation   Mild Mitral Valve Regurgitation  - Noted on echocardiogram from 03/2022 - Will need repeat study in 3-5 years  Medication Adjustments/Labs and Tests Ordered: Current medicines are reviewed at length with the patient today.  Concerns regarding medicines are outlined above.  Orders Placed This Encounter  Procedures   EKG 12-Lead   No orders of the defined types were placed in this encounter.   Patient Instructions  Medication  Instructions:  Your physician recommends that you continue on your current medications as directed. Please refer to the Current Medication list given to you today.  *If you need a refill on your cardiac medications before your next appointment, please call your pharmacy*   Lab Work: None ordered  If you have labs (blood work) drawn today and your tests are completely normal, you will receive your results only by: MyChart Message (if you have MyChart) OR A paper copy in the mail If you have any lab test that is abnormal or we need to change your treatment, we will call you to review the results.   Testing/Procedures: None ordered   Follow-Up: At [facility], you and your health needs are our priority.  As part of our continuing mission to provide you with exceptional heart care, we have created designated Provider Care Teams.  These Care Teams include your primary Cardiologist (physician) and Advanced Practice Providers (APPs -  Physician Assistants and Nurse Practitioners) who all work together to provide you with the care you need, when you need it.  We recommend signing up for the patient portal called "MyChart".  Sign up information is provided on this After Visit Summary.  MyChart is used to connect with patients for Virtual Visits (Telemedicine).  Patients are able to view lab/test results, encounter notes, upcoming appointments, etc.  Non-urgent messages can be sent to your provider as well.   To learn more about what you can do with MyChart, go to [email or web address].    Your next appointment:   1 year(s)  You will receive a reminder letter in the mail two months in advance. If you don't receive a letter, please call our office to schedule the follow-up appointment.   Provider:   Tonny Bollman, MD     Other Instructions     Signed, Jonita Albee, PA-C  04/09/2023 11:07 AM    [facility]

## 2023-04-09 ENCOUNTER — Encounter: Payer: Self-pay | Admitting: Cardiology

## 2023-04-09 ENCOUNTER — Ambulatory Visit: Payer: Medicare HMO | Attending: Physician Assistant | Admitting: Cardiology

## 2023-04-09 VITALS — BP 142/78 | HR 75 | Ht 65.0 in | Wt 139.6 lb

## 2023-04-09 DIAGNOSIS — R55 Syncope and collapse: Secondary | ICD-10-CM | POA: Diagnosis not present

## 2023-04-09 DIAGNOSIS — E782 Mixed hyperlipidemia: Secondary | ICD-10-CM | POA: Diagnosis not present

## 2023-04-09 DIAGNOSIS — I10 Essential (primary) hypertension: Secondary | ICD-10-CM

## 2023-04-09 DIAGNOSIS — I2510 Atherosclerotic heart disease of native coronary artery without angina pectoris: Secondary | ICD-10-CM | POA: Diagnosis not present

## 2023-04-09 DIAGNOSIS — I340 Nonrheumatic mitral (valve) insufficiency: Secondary | ICD-10-CM

## 2023-04-09 NOTE — Patient Instructions (Signed)
Medication Instructions:  Your physician recommends that you continue on your current medications as directed. Please refer to the Current Medication list given to you today.  *If you need a refill on your cardiac medications before your next appointment, please call your pharmacy*   Lab Work: None ordered  If you have labs (blood work) drawn today and your tests are completely normal, you will receive your results only by: MyChart Message (if you have MyChart) OR A paper copy in the mail If you have any lab test that is abnormal or we need to change your treatment, we will call you to review the results.   Testing/Procedures: None ordered   Follow-Up: At [facility], you and your health needs are our priority.  As part of our continuing mission to provide you with exceptional heart care, we have created designated Provider Care Teams.  These Care Teams include your primary Cardiologist (physician) and Advanced Practice Providers (APPs -  Physician Assistants and Nurse Practitioners) who all work together to provide you with the care you need, when you need it.  We recommend signing up for the patient portal called "MyChart".  Sign up information is provided on this After Visit Summary.  MyChart is used to connect with patients for Virtual Visits (Telemedicine).  Patients are able to view lab/test results, encounter notes, upcoming appointments, etc.  Non-urgent messages can be sent to your provider as well.   To learn more about what you can do with MyChart, go to [email or web address].    Your next appointment:   1 year(s)  You will receive a reminder letter in the mail two months in advance. If you don't receive a letter, please call our office to schedule the follow-up appointment.   Provider:   Tonny Bollman, MD     Other Instructions

## 2023-05-05 DIAGNOSIS — H5203 Hypermetropia, bilateral: Secondary | ICD-10-CM | POA: Diagnosis not present

## 2023-05-05 DIAGNOSIS — H524 Presbyopia: Secondary | ICD-10-CM | POA: Diagnosis not present

## 2023-05-05 DIAGNOSIS — H25013 Cortical age-related cataract, bilateral: Secondary | ICD-10-CM | POA: Diagnosis not present

## 2023-05-05 DIAGNOSIS — H18413 Arcus senilis, bilateral: Secondary | ICD-10-CM | POA: Diagnosis not present

## 2023-05-05 DIAGNOSIS — H16143 Punctate keratitis, bilateral: Secondary | ICD-10-CM | POA: Diagnosis not present

## 2023-05-05 DIAGNOSIS — H2513 Age-related nuclear cataract, bilateral: Secondary | ICD-10-CM | POA: Diagnosis not present

## 2023-05-27 ENCOUNTER — Ambulatory Visit (INDEPENDENT_AMBULATORY_CARE_PROVIDER_SITE_OTHER): Payer: Medicare HMO

## 2023-05-27 VITALS — Wt 139.0 lb

## 2023-05-27 DIAGNOSIS — Z0000 Encounter for general adult medical examination without abnormal findings: Secondary | ICD-10-CM

## 2023-05-27 NOTE — Patient Instructions (Signed)
[name] , Thank you for taking time to come for your Medicare Wellness Visit. I appreciate your ongoing commitment to your health goals. Please review the following plan we discussed and let me know if I can assist you in the future.   These are the goals we discussed:  Goals      Patient Stated     None at this time      Patient Stated     Walk more         This is a list of the screening recommended for you and due dates:  Health Maintenance  Topic Date Due   COVID-19 Vaccine (7 - 2023-24 season) 11/21/2022   Flu Shot  07/24/2023   Medicare Annual Wellness Visit  05/26/2024   DTaP/Tdap/Td vaccine (3 - Td or Tdap) 12/02/2024   Pneumonia Vaccine  Completed   DEXA scan (bone density measurement)  Completed   Zoster (Shingles) Vaccine  Completed   HPV Vaccine  Aged Out    Advanced directives: Advance directive discussed with you today. Even though you declined this today please call our office should you change your mind and we can give you the proper paperwork for you to fill out.  Conditions/risks identified: walk more   Next appointment: Follow up in one year for your annual wellness visit    Preventive Care 65 Years and Older, Female Preventive care refers to lifestyle choices and visits with your health care provider that can promote health and wellness. What does preventive care include? A yearly physical exam. This is also called an annual well check. Dental exams once or twice a year. Routine eye exams. Ask your health care provider how often you should have your eyes checked. Personal lifestyle choices, including: Daily care of your teeth and gums. Regular physical activity. Eating a healthy diet. Avoiding tobacco and drug use. Limiting alcohol use. Practicing safe sex. Taking low-dose aspirin every day. Taking vitamin and mineral supplements as recommended by your health care provider. What happens during an annual well check? The services and screenings  done by your health care provider during your annual well check will depend on your age, overall health, lifestyle risk factors, and family history of disease. Counseling  Your health care provider may ask you questions about your: Alcohol use. Tobacco use. Drug use. Emotional well-being. Home and relationship well-being. Sexual activity. Eating habits. History of falls. Memory and ability to understand (cognition). Work and work Astronomer. Reproductive health. Screening  You may have the following tests or measurements: Height, weight, and BMI. Blood pressure. Lipid and cholesterol levels. These may be checked every 5 years, or more frequently if you are over 46 years old. Skin check. Lung cancer screening. You may have this screening every year starting at age 7 if you have a 30-pack-year history of smoking and currently smoke or have quit within the past 15 years. Fecal occult blood test (FOBT) of the stool. You may have this test every year starting at age 48. Flexible sigmoidoscopy or colonoscopy. You may have a sigmoidoscopy every 5 years or a colonoscopy every 10 years starting at age 72. Hepatitis C blood test. Hepatitis B blood test. Sexually transmitted disease (STD) testing. Diabetes screening. This is done by checking your blood sugar (glucose) after you have not eaten for a while (fasting). You may have this done every 1-3 years. Bone density scan. This is done to screen for osteoporosis. You may have this done starting at age 77. Mammogram. This  may be done every 1-2 years. Talk to your health care provider about how often you should have regular mammograms. Talk with your health care provider about your test results, treatment options, and if necessary, the need for more tests. Vaccines  Your health care provider may recommend certain vaccines, such as: Influenza vaccine. This is recommended every year. Tetanus, diphtheria, and acellular pertussis (Tdap, Td)  vaccine. You may need a Td booster every 10 years. Zoster vaccine. You may need this after age 38. Pneumococcal 13-valent conjugate (PCV13) vaccine. One dose is recommended after age 66. Pneumococcal polysaccharide (PPSV23) vaccine. One dose is recommended after age 63. Talk to your health care provider about which screenings and vaccines you need and how often you need them. This information is not intended to replace advice given to you by your health care provider. Make sure you discuss any questions you have with your health care provider. Document Released: 01/05/2016 Document Revised: 08/28/2016 Document Reviewed: 10/10/2015 Elsevier Interactive Patient Education  2017 [employer].  Fall Prevention in the Home Falls can cause injuries. They can happen to people of all ages. There are many things you can do to make your home safe and to help prevent falls. What can I do on the outside of my home? Regularly fix the edges of walkways and driveways and fix any cracks. Remove anything that might make you trip as you walk through a door, such as a raised step or threshold. Trim any bushes or trees on the path to your home. Use bright outdoor lighting. Clear any walking paths of anything that might make someone trip, such as rocks or tools. Regularly check to see if handrails are loose or broken. Make sure that both sides of any steps have handrails. Any raised decks and porches should have guardrails on the edges. Have any leaves, snow, or ice cleared regularly. Use sand or salt on walking paths during winter. Clean up any spills in your garage right away. This includes oil or grease spills. What can I do in the bathroom? Use night lights. Install grab bars by the toilet and in the tub and shower. Do not use towel bars as grab bars. Use non-skid mats or decals in the tub or shower. If you need to sit down in the shower, use a plastic, non-slip stool. Keep the floor dry. Clean up any  water that spills on the floor as soon as it happens. Remove soap buildup in the tub or shower regularly. Attach bath mats securely with double-sided non-slip rug tape. Do not have throw rugs and other things on the floor that can make you trip. What can I do in the bedroom? Use night lights. Make sure that you have a light by your bed that is easy to reach. Do not use any sheets or blankets that are too big for your bed. They should not hang down onto the floor. Have a firm chair that has side arms. You can use this for support while you get dressed. Do not have throw rugs and other things on the floor that can make you trip. What can I do in the kitchen? Clean up any spills right away. Avoid walking on wet floors. Keep items that you use a lot in easy-to-reach places. If you need to reach something above you, use a strong step stool that has a grab bar. Keep electrical cords out of the way. Do not use floor polish or wax that makes floors slippery. If you must  use wax, use non-skid floor wax. Do not have throw rugs and other things on the floor that can make you trip. What can I do with my stairs? Do not leave any items on the stairs. Make sure that there are handrails on both sides of the stairs and use them. Fix handrails that are broken or loose. Make sure that handrails are as long as the stairways. Check any carpeting to make sure that it is firmly attached to the stairs. Fix any carpet that is loose or worn. Avoid having throw rugs at the top or bottom of the stairs. If you do have throw rugs, attach them to the floor with carpet tape. Make sure that you have a light switch at the top of the stairs and the bottom of the stairs. If you do not have them, ask someone to add them for you. What else can I do to help prevent falls? Wear shoes that: Do not have high heels. Have rubber bottoms. Are comfortable and fit you well. Are closed at the toe. Do not wear sandals. If you use a  stepladder: Make sure that it is fully opened. Do not climb a closed stepladder. Make sure that both sides of the stepladder are locked into place. Ask someone to hold it for you, if possible. Clearly mark and make sure that you can see: Any grab bars or handrails. First and last steps. Where the edge of each step is. Use tools that help you move around (mobility aids) if they are needed. These include: Canes. Walkers. Scooters. Crutches. Turn on the lights when you go into a dark area. Replace any light bulbs as soon as they burn out. Set up your furniture so you have a clear path. Avoid moving your furniture around. If any of your floors are uneven, fix them. If there are any pets around you, be aware of where they are. Review your medicines with your doctor. Some medicines can make you feel dizzy. This can increase your chance of falling. Ask your doctor what other things that you can do to help prevent falls. This information is not intended to replace advice given to you by your health care provider. Make sure you discuss any questions you have with your health care provider. Document Released: 10/05/2009 Document Revised: 05/16/2016 Document Reviewed: 01/13/2015 Elsevier Interactive Patient Education  2017 [employer].

## 2023-05-27 NOTE — Progress Notes (Addendum)
I connected with  [name] on 05/27/23 by a audio enabled telemedicine application and verified that I am speaking with the correct person using two identifiers.  Patient Location: Home  Provider Location: Office/Clinic  I discussed the limitations of evaluation and management by telemedicine. The patient expressed understanding and agreed to proceed.   Subjective:   Jennifer Gross is a 82 y.o. female who presents for Medicare Annual (Subsequent) preventive examination.  Review of Systems     Cardiac Risk Factors include: advanced age (>17men, >27 women);dyslipidemia;hypertension     Objective:    Today's Vitals   05/27/23 0802  Weight: 139 lb (63 kg)   Body mass index is 23.13 kg/m.     05/27/2023    8:06 AM 07/13/2021    8:50 AM 07/20/2018   11:38 AM 07/08/2018   10:52 AM 06/17/2014    3:11 PM  Advanced Directives  Does Patient Have a Medical Advance Directive? No No No No Patient does not have advance directive  Does patient want to make changes to medical advance directive?   No - Patient declined    Would patient like information on creating a medical advance directive? No - Patient declined No - Patient declined No - Patient declined No - Patient declined     Current Medications (verified) Outpatient Encounter Medications as of 05/27/2023  Medication Sig   acetaminophen (TYLENOL) 500 MG tablet Take 1,000 mg by mouth 2 (two) times daily as needed for moderate pain or headache.   aspirin 81 MG tablet Take 81 mg by mouth every evening.   atorvastatin (LIPITOR) 80 MG tablet TAKE 1 TABLET BY MOUTH ONCE DAILY AS DIRECTED   denosumab (PROLIA) 60 MG/ML SOSY injection inject 1 subcutaneous syringe every 6 months to be given in physicians office   diclofenac sodium (VOLTAREN) 1 % GEL Apply 2 g topically 3 (three) times daily as needed.   ezetimibe (ZETIA) 10 MG tablet Take 1 tablet by mouth once daily   nitroGLYCERIN (NITROSTAT) 0.4 MG SL tablet Place 1 tablet (0.4 mg  total) under the tongue every 5 (five) minutes as needed for chest pain (call 911 if no relief after 2nd dose).   No facility-administered encounter medications on file as of 05/27/2023.    Allergies (verified) Penicillins   History: Past Medical History:  Diagnosis Date   ALLERGIC RHINITIS 04/08/2008   Allergy    Basal cell carcinoma    back   CAD (coronary artery disease) 2008   status post anterior myocardial infarction, treated w/ a dru-eluting stent to the LAD   CAD, NATIVE VESSEL 11/10/2009   GERD (gastroesophageal reflux disease)    Hiatal hernia    HYPERLIPIDEMIA 03/29/2008   HYPERTENSION 03/29/2008   Myocardial infarction [facility])    2008   Osteopenia    Past Surgical History:  Procedure Laterality Date   ANTERIOR AND POSTERIOR REPAIR WITH SACROSPINOUS FIXATION N/A 07/20/2018   Procedure: ANTERIOR AND POSTERIOR REPAIR WITH SACROSPINOUS FIXATION;  Surgeon: Richardean Chimera, MD;  Location: [facility];  Service: Gynecology;  Laterality: N/A;   APPENDECTOMY     BASAL CELL CARCINOMA EXCISION     back   BLADDER SUSPENSION N/A 07/20/2018   Procedure: TRANSOBTURATOR Marella Chimes;  Surgeon: Richardean Chimera, MD;  Location: [facility];  Service: Gynecology;  Laterality: N/A;   LAPAROSCOPIC ASSISTED VAGINAL HYSTERECTOMY Bilateral    LAPAROSCOPIC VAGINAL HYSTERECTOMY WITH SALPINGO OOPHORECTOMY Bilateral 07/20/2018   Procedure: LAPAROSCOPIC ASSISTED VAGINAL HYSTERECTOMY WITH SALPINGO OOPHORECTOMY;  Surgeon: Arelia Sneddon,  Jonny Ruiz, MD;  Location: [facility];  Service: Gynecology;  Laterality: Bilateral;   Percutaneous transluminal coronary angioplasty and drug-eluting  09/2007   Stent placement in the left anterior descending   WISDOM TOOTH EXTRACTION     Family History  Problem Relation Age of Onset   Coronary artery disease Mother    Lung cancer Father    Heart disease Brother        stent placed   Coronary artery disease Maternal Uncle    CAD Son        died 70 of MI   Colon cancer Neg Hx    Esophageal cancer  Neg Hx    Rectal cancer Neg Hx    Stomach cancer Neg Hx    Social History   Socioeconomic History   Marital status: Married    Spouse name: Not on file   Number of children: 4   Years of education: Not on file   Highest education level: Not on file  Occupational History   Occupation: retired  Tobacco Use   Smoking status: Never   Smokeless tobacco: Never  Vaping Use   Vaping Use: Never used  Substance and Sexual Activity   Alcohol use: No   Drug use: No   Sexual activity: Not on file  Other Topics Concern   Not on file  Social History Narrative   Married 1970. 2 living children. 2 living children- had 4 (lost one at age 73 [place]athon drug related to heart attack and one at 31 [place] to heart attack not drug related), 6 grandkids. 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, Microbiologist, Doctor, hospital      Hobbies: cooking   Social Determinants of Health   Financial Resource Strain: Low Risk  (05/27/2023)   Overall Financial Resource Strain (CARDIA)    Difficulty of Paying Living Expenses: Not hard at all  Food Insecurity: No Food Insecurity (05/27/2023)   Hunger Vital Sign    Worried About Running Out of Food in the Last Year: Never true    Ran Out of Food in the Last Year: Never true  Transportation Needs: No Transportation Needs (05/27/2023)   PRAPARE - Administrator, Civil Service (Medical): No    Lack of Transportation (Non-Medical): No  Physical Activity: Inactive (05/27/2023)   Exercise Vital Sign    Days of Exercise per Week: 0 days    Minutes of Exercise per Session: 0 min  Stress: No Stress Concern Present (05/27/2023)   [employer] of Occupational Health - Occupational Stress Questionnaire    Feeling of Stress : Not at all  Social Connections: Moderately Integrated (05/27/2023)   Social Connection and Isolation Panel [NHANES]    Frequency of Communication with Friends and Family: More than three times a week     Frequency of Social Gatherings with Friends and Family: More than three times a week    Attends Religious Services: More than 4 times per year    Active Member of [employer] or Organizations: No    Attends Engineer, structural: Never    Marital Status: Married    Tobacco Counseling Counseling given: Not Answered   Clinical Intake:  Pre-visit preparation completed: Yes  Pain : No/denies pain     BMI - recorded: 23.13 Nutritional Status: BMI of 19-24  Normal Nutritional Risks: None Diabetes: No  How often do you need to have someone help you when  you read instructions, pamphlets, or other written materials from your doctor or pharmacy?: 1 - Never  Diabetic?no  Interpreter Needed?: No  Information entered by :: Lanier Ensign, LPN   Activities of Daily Living    05/27/2023    8:07 AM  In your present state of health, do you have any difficulty performing the following activities:  Hearing? 1  Comment slight loss  Vision? 0  Difficulty concentrating or making decisions? 0  Walking or climbing stairs? 0  Dressing or bathing? 0  Doing errands, shopping? 0  Preparing Food and eating ? N  Using the Toilet? N  In the past six months, have you accidently leaked urine? N  Do you have problems with loss of bowel control? N  Managing your Medications? N  Managing your Finances? N  Housekeeping or managing your Housekeeping? N    Patient Care Team: Shelva Majestic, MD as PCP - General (Family Medicine) Tonny Bollman, MD as PCP - Cardiology (Cardiology) Richardean Chimera, MD as Consulting Physician (Obstetrics and Gynecology)  Indicate any recent Medical Services you may have received from other than [facility] providers in the past year (date may be approximate).     Assessment:   This is a routine wellness examination for [place].  Hearing/Vision screen Hearing Screening - Comments:: Pt slight loss  Vision Screening - Comments:: Pt follows up with Dr Precious Bard for annual  eye exams   Dietary issues and exercise activities discussed: Current Exercise Habits: The patient does not participate in regular exercise at present   Goals Addressed             This Visit's Progress    Patient Stated       Walk more        Depression Screen    05/27/2023    8:05 AM 12/26/2022    1:18 PM 12/13/2021    1:45 PM 07/13/2021    8:49 AM 05/15/2021    1:21 PM 02/13/2021    9:44 AM 11/14/2020    2:35 PM  PHQ 2/9 Scores  PHQ - 2 Score 0 0 0 0 0 0 0  PHQ- 9 Score  0   0      Fall Risk    05/27/2023    8:07 AM 12/26/2022    1:18 PM 12/13/2021    1:45 PM 07/13/2021    8:51 AM 05/15/2021    1:20 PM  Fall Risk   Falls in the past year? 0 0 0 0 0  Number falls in past yr: 0 0 0 0 0  Injury with Fall? 0 0 0 0 0  Risk for fall due to : Impaired vision No Fall Risks  Impaired vision   Follow up Falls prevention discussed Falls evaluation completed  Falls prevention discussed     FALL RISK PREVENTION PERTAINING TO THE HOME:  Any stairs in or around the home? Yes  If so, are there any without handrails? No  Home free of loose throw rugs in walkways, pet beds, electrical cords, etc? Yes  Adequate lighting in your home to reduce risk of falls? Yes   ASSISTIVE DEVICES UTILIZED TO PREVENT FALLS:  Life alert? No  Use of a cane, walker or w/c? No  Grab bars in the bathroom? No  Shower chair or bench in shower? No  Elevated toilet seat or a handicapped toilet? No   TIMED UP AND GO:  Was the test performed? No .   Cognitive Function:  05/27/2023    8:08 AM 07/13/2021    8:53 AM  6CIT Screen  What Year? 0 points 0 points  What month? 0 points 0 points  What time? 0 points 0 points  Count back from 20 0 points 0 points  Months in reverse 0 points 0 points  Repeat phrase 0 points 0 points  Total Score 0 points 0 points    Immunizations Immunization History  Administered Date(s) Administered   Fluad Quad(high Dose 65+) 08/25/2019, 11/16/2020,  09/18/2021, 09/27/2022   Influenza Split 09/27/2011, 10/13/2012   Influenza Whole 09/29/2008, 09/27/2009, 10/04/2010   Influenza, High Dose Seasonal PF 09/24/2013, 10/04/2014, 09/28/2015, 09/27/2016, 08/28/2017, 09/21/2018   PFIZER Comirnaty(Gray Top)Covid-19 Tri-Sucrose Vaccine 09/26/2022   PFIZER(Purple Top)SARS-COV-2 Vaccination 01/08/2020, 01/29/2020, 10/17/2020, 03/27/2021   Pfizer Covid-19 Vaccine Bivalent Booster 86yrs & up 11/19/2021   Pneumococcal Conjugate-13 12/02/2014   Pneumococcal Polysaccharide-23 03/29/2008   Pneumococcal-Unspecified 03/05/2021   Respiratory Syncytial Virus Vaccine,Recomb Aduvanted(Arexvy) 09/26/2022   Td 03/29/2008   Tdap 12/02/2014   Zoster Recombinat (Shingrix) 05/26/2018, 08/25/2018   Zoster, Live 02/23/2008    TDAP status: Up to date  Flu Vaccine status: Up to date  Pneumococcal vaccine status: Up to date  Covid-19 vaccine status: Completed vaccines  Qualifies for Shingles Vaccine? Yes   Zostavax completed Yes   Shingrix Completed?: Yes  Screening Tests Health Maintenance  Topic Date Due   COVID-19 Vaccine (7 - 2023-24 season) 11/21/2022   INFLUENZA VACCINE  07/24/2023   Medicare Annual Wellness (AWV)  05/26/2024   DTaP/Tdap/Td (3 - Td or Tdap) 12/02/2024   Pneumonia Vaccine 58+ Years old  Completed   DEXA SCAN  Completed   Zoster Vaccines- Shingrix  Completed   HPV VACCINES  Aged Out    Health Maintenance  Health Maintenance Due  Topic Date Due   COVID-19 Vaccine (7 - 2023-24 season) 11/21/2022    Colorectal cancer screening: No longer required.   Mammogram status: Completed pt stated 06/2022. Repeat every year  Bone Density status: Completed 05/08/23. Results reflect: Bone density results: OSTEOPOROSIS. Repeat every 2 years.  Additional Screening:   Vision Screening: Recommended annual ophthalmology exams for early detection of glaucoma and other disorders of the eye. Is the patient up to date with their annual eye exam?   Yes  Who is the provider or what is the name of the office in which the patient attends annual eye exams? Dr Precious Bard  If pt is not established with a provider, would they like to be referred to a provider to establish care? No .   Dental Screening: Recommended annual dental exams for proper oral hygiene  Community Resource Referral / Chronic Care Management: CRR required this visit?  No   CCM required this visit?  No      Plan:     I have personally reviewed and noted the following in the patient's chart:   Medical and social history Use of alcohol, tobacco or illicit drugs  Current medications and supplements including opioid prescriptions. Patient is not currently taking opioid prescriptions. Functional ability and status Nutritional status Physical activity Advanced directives List of other physicians Hospitalizations, surgeries, and ER visits in previous 12 months Vitals Screenings to include cognitive, depression, and falls Referrals and appointments  In addition, I have reviewed and discussed with patient certain preventive protocols, quality metrics, and best practice recommendations. A written personalized care plan for preventive services as well as general preventive health recommendations were provided to patient.     Verdon Cummins  Darnell Jeschke, LPN   [id number]   Nurse Notes: none

## 2023-06-10 ENCOUNTER — Other Ambulatory Visit: Payer: Self-pay | Admitting: Family Medicine

## 2023-06-22 ENCOUNTER — Other Ambulatory Visit: Payer: Self-pay | Admitting: Family Medicine

## 2023-06-30 DIAGNOSIS — L578 Other skin changes due to chronic exposure to nonionizing radiation: Secondary | ICD-10-CM | POA: Diagnosis not present

## 2023-07-07 ENCOUNTER — Encounter: Payer: Self-pay | Admitting: Family Medicine

## 2023-07-07 ENCOUNTER — Ambulatory Visit: Payer: Medicare HMO | Admitting: Family Medicine

## 2023-07-07 VITALS — BP 128/70 | HR 76 | Temp 97.6°F | Ht 65.0 in | Wt 141.6 lb

## 2023-07-07 DIAGNOSIS — E785 Hyperlipidemia, unspecified: Secondary | ICD-10-CM

## 2023-07-07 DIAGNOSIS — Z131 Encounter for screening for diabetes mellitus: Secondary | ICD-10-CM | POA: Diagnosis not present

## 2023-07-07 DIAGNOSIS — R739 Hyperglycemia, unspecified: Secondary | ICD-10-CM | POA: Diagnosis not present

## 2023-07-07 DIAGNOSIS — I10 Essential (primary) hypertension: Secondary | ICD-10-CM | POA: Diagnosis not present

## 2023-07-07 DIAGNOSIS — I2510 Atherosclerotic heart disease of native coronary artery without angina pectoris: Secondary | ICD-10-CM

## 2023-07-07 LAB — COMPREHENSIVE METABOLIC PANEL
ALT: 19 U/L (ref 0–35)
AST: 23 U/L (ref 0–37)
Albumin: 4.2 g/dL (ref 3.5–5.2)
Alkaline Phosphatase: 73 U/L (ref 39–117)
BUN: 30 mg/dL — ABNORMAL HIGH (ref 6–23)
CO2: 26 mEq/L (ref 19–32)
Calcium: 9.6 mg/dL (ref 8.4–10.5)
Chloride: 104 mEq/L (ref 96–112)
Creatinine, Ser: 0.83 mg/dL (ref 0.40–1.20)
GFR: 65.45 mL/min (ref >60.00)
Glucose, Bld: 93 mg/dL (ref 70–99)
Potassium: 4 mEq/L (ref 3.5–5.1)
Sodium: 138 mEq/L (ref 135–145)
Total Bilirubin: 0.6 mg/dL (ref 0.2–1.2)
Total Protein: 6.9 g/dL (ref 6.0–8.3)

## 2023-07-07 LAB — HEMOGLOBIN A1C: Hgb A1c MFr Bld: 6.2 % (ref 4.6–6.5)

## 2023-07-07 NOTE — Patient Instructions (Addendum)
looks more like trochanteric bursitis- can refer to Dr. Jean Rosenthal of sports medicine when you are ready   Please stop by lab before you go If you have mychart- we will send your results within 3 business days of [place] receiving them.  If you do not have mychart- we will call you about results within 5 business days of [place] receiving them.  *please also note that you will see labs on mychart as soon as they post. I will later go in and write notes on them- will say "notes from Dr. Durene Cal"   Recommended follow up: Return in about 6 months (around 01/07/2024) for physical or sooner if needed.Schedule b4 you leave.

## 2023-07-07 NOTE — Progress Notes (Signed)
Phone [phone] In person visit   Subjective:   Jennifer Gross is a 82 y.o. year old very pleasant female patient who presents for/with See problem oriented charting Chief Complaint  Patient presents with   Medical Management of Chronic Issues   Hypertension   left hip pain    Pt c/o left hip pain thinks it may be arthritis.   Past Medical History-  Patient Active Problem List   Diagnosis Date Noted   CAD (coronary artery disease) 11/10/2009    Priority: High   Hyperglycemia, unspecified 05/11/2020    Priority: Medium    Osteoporosis     Priority: Medium    Hyperlipidemia 03/29/2008    Priority: Medium    Essential hypertension 03/29/2008    Priority: Medium    S/P laparoscopic assisted vaginal hysterectomy (LAVH) 07/20/2018    Priority: Low   History of basal cell carcinoma of skin     Priority: Low   GERD 05/05/2008    Priority: Low   Allergic rhinitis 04/08/2008    Priority: Low   Left hip pain 02/10/2019    Priority: 1.   Left shoulder pain 02/10/2019    Priority: 1.   Chronic pain of right knee 10/12/2017    Priority: 1.    Medications- reviewed and updated Current Outpatient Medications  Medication Sig Dispense Refill   acetaminophen (TYLENOL) 500 MG tablet Take 1,000 mg by mouth 2 (two) times daily as needed for moderate pain or headache.     aspirin 81 MG tablet Take 81 mg by mouth every evening.     atorvastatin (LIPITOR) 80 MG tablet TAKE 1 TABLET BY MOUTH ONCE DAILY AS DIRECTED 90 tablet 0   denosumab (PROLIA) 60 MG/ML SOSY injection inject 1 subcutaneous syringe every 6 months to be given in physicians office     diclofenac sodium (VOLTAREN) 1 % GEL Apply 2 g topically 3 (three) times daily as needed. 100 g 5   ezetimibe (ZETIA) 10 MG tablet Take 1 tablet by mouth once daily 90 tablet 0   nitroGLYCERIN (NITROSTAT) 0.4 MG SL tablet Place 1 tablet (0.4 mg total) under the tongue every 5 (five) minutes as needed for chest pain (call 911 if no relief  after 2nd dose). 25 tablet 2   No current facility-administered medications for this visit.     Objective:  BP 128/70 Comment: this mornings bp at home  Pulse 76   Temp 97.6 F (36.4 C)   Ht 5\' 5"  (1.651 m)   Wt 141 lb 9.6 oz (64.2 kg)   SpO2 96%   BMI 23.56 kg/m  Gen: NAD, resting comfortably CV: RRR no murmurs rubs or gallops Lungs: CTAB no crackles, wheeze, rhonchi Ext: no edema Skin: warm, dry MUSCULOSKELETAL: pain over left trochanteric bursa    Assessment and Plan   # Left hip pain S:lateral hip pain- no groin pain   A/P: looks more like trochanteric bursitis- can refer to Dr. Jean Rosenthal of sports medicine when you are ready - she has some upcoming events and wants to hold off on referral for now  #CAD/hyperlipidemia- Sees Dr. Excell Seltzer S: Medication: atorvastatin 80mg , Zetia 10mg .  Aspirin 81 mg.  Nitroglycerin available as needed In regards to CAD-no chest pain or shortness of breath   Lab Results  Component Value Date   CHOL 157 12/26/2022   HDL 70.10 12/26/2022   LDLCALC 70 12/26/2022   LDLDIRECT 57.0 11/11/2018   TRIG 86.0 12/26/2022   CHOLHDL 2 12/26/2022  A/P: coronary artery disease asymptomatic continue current medications Lipid- at goal- continue current medications with LDL right at 70- she aks about reducing atorvastatin but with CAD would not adjust this- conitnue zetia as well as aspirin   #hypertension-blood pressure tends to run higher in office S: medication: off losartan now -previously Losartan 25mg  down from 100mg  due to orthostatic symptoms -hctz stopped 2021 due to lightheadedness, later stopped coreg 25 mg BID (bradycardia)  Home readings #s: monitors at home   BP Readings from Last 3 Encounters:  07/07/23 128/70  04/09/23 (!) 142/78  12/26/22 122/70  A/P: stable- continue current medicines   # Hyperglycemia/insulin resistance/prediabetes-peak A1c 6.24 October 2019 S:  Medication: none. Statins do increase diabetes risk  Lab Results   Component Value Date   HGBA1C 6.0 12/26/2022   HGBA1C 6.1 12/13/2021   HGBA1C 5.7 (H) 11/14/2020   A/P: hopefully stable- update a1c today. Continue current meds for now   % Osteoporosis-managed by Dr[facility]ned[facility].   -on prolia q6 months through GYN -boniva= reflux/chest pain -She is compliant with calcium and vitamin D   Recommended follow up: Return in about 6 months (around 01/07/2024) for physical or sooner if needed.Schedule b4 you leave. Future Appointments  Date Time Provider Department Center  06/01/2024  7:45 AM [facility] ANNUAL WELLNESS VISIT 1 [facility]   Lab/Order associations:   ICD-10-CM   1. Coronary artery disease involving native coronary artery of native heart without angina pectoris  I25.10     2. Hyperlipidemia, unspecified hyperlipidemia type  E78.5 Comprehensive metabolic panel    3. Essential hypertension  I10     4. Hyperglycemia, unspecified  R73.9 Hemoglobin A1c    5. Screening for diabetes mellitus  Z13.1 Hemoglobin A1c     No orders of the defined types were placed in this encounter.  Return precautions advised.  Tana Conch, MD

## 2023-07-09 DIAGNOSIS — Z01419 Encounter for gynecological examination (general) (routine) without abnormal findings: Secondary | ICD-10-CM | POA: Diagnosis not present

## 2023-07-09 DIAGNOSIS — Z1231 Encounter for screening mammogram for malignant neoplasm of breast: Secondary | ICD-10-CM | POA: Diagnosis not present

## 2023-07-09 DIAGNOSIS — Z6824 Body mass index (BMI) 24.0-24.9, adult: Secondary | ICD-10-CM | POA: Diagnosis not present

## 2023-07-09 DIAGNOSIS — M810 Age-related osteoporosis without current pathological fracture: Secondary | ICD-10-CM | POA: Diagnosis not present

## 2023-07-24 DIAGNOSIS — M810 Age-related osteoporosis without current pathological fracture: Secondary | ICD-10-CM | POA: Diagnosis not present

## 2023-07-29 DIAGNOSIS — H2513 Age-related nuclear cataract, bilateral: Secondary | ICD-10-CM | POA: Diagnosis not present

## 2023-07-29 DIAGNOSIS — H18413 Arcus senilis, bilateral: Secondary | ICD-10-CM | POA: Diagnosis not present

## 2023-07-29 DIAGNOSIS — H25043 Posterior subcapsular polar age-related cataract, bilateral: Secondary | ICD-10-CM | POA: Diagnosis not present

## 2023-07-29 DIAGNOSIS — H25013 Cortical age-related cataract, bilateral: Secondary | ICD-10-CM | POA: Diagnosis not present

## 2023-07-29 DIAGNOSIS — H2511 Age-related nuclear cataract, right eye: Secondary | ICD-10-CM | POA: Diagnosis not present

## 2023-09-26 ENCOUNTER — Other Ambulatory Visit: Payer: Self-pay | Admitting: Family Medicine

## 2023-09-30 ENCOUNTER — Other Ambulatory Visit: Payer: Self-pay | Admitting: Family Medicine

## 2023-10-01 DIAGNOSIS — H2511 Age-related nuclear cataract, right eye: Secondary | ICD-10-CM | POA: Diagnosis not present

## 2023-10-01 DIAGNOSIS — H2513 Age-related nuclear cataract, bilateral: Secondary | ICD-10-CM | POA: Diagnosis not present

## 2023-10-01 DIAGNOSIS — H18413 Arcus senilis, bilateral: Secondary | ICD-10-CM | POA: Diagnosis not present

## 2023-10-01 DIAGNOSIS — H25013 Cortical age-related cataract, bilateral: Secondary | ICD-10-CM | POA: Diagnosis not present

## 2023-10-01 DIAGNOSIS — H16143 Punctate keratitis, bilateral: Secondary | ICD-10-CM | POA: Diagnosis not present

## 2023-10-02 DIAGNOSIS — H25042 Posterior subcapsular polar age-related cataract, left eye: Secondary | ICD-10-CM | POA: Diagnosis not present

## 2023-10-02 DIAGNOSIS — H25012 Cortical age-related cataract, left eye: Secondary | ICD-10-CM | POA: Diagnosis not present

## 2023-10-02 DIAGNOSIS — H2512 Age-related nuclear cataract, left eye: Secondary | ICD-10-CM | POA: Diagnosis not present

## 2023-10-15 DIAGNOSIS — H18413 Arcus senilis, bilateral: Secondary | ICD-10-CM | POA: Diagnosis not present

## 2023-10-15 DIAGNOSIS — H2512 Age-related nuclear cataract, left eye: Secondary | ICD-10-CM | POA: Diagnosis not present

## 2023-10-15 DIAGNOSIS — H2513 Age-related nuclear cataract, bilateral: Secondary | ICD-10-CM | POA: Diagnosis not present

## 2023-10-15 DIAGNOSIS — H25013 Cortical age-related cataract, bilateral: Secondary | ICD-10-CM | POA: Diagnosis not present

## 2023-10-15 DIAGNOSIS — H2511 Age-related nuclear cataract, right eye: Secondary | ICD-10-CM | POA: Diagnosis not present

## 2023-10-22 ENCOUNTER — Other Ambulatory Visit: Payer: Self-pay | Admitting: Family Medicine

## 2023-11-11 DIAGNOSIS — H524 Presbyopia: Secondary | ICD-10-CM | POA: Diagnosis not present

## 2023-12-01 DIAGNOSIS — L570 Actinic keratosis: Secondary | ICD-10-CM | POA: Diagnosis not present

## 2023-12-01 DIAGNOSIS — D225 Melanocytic nevi of trunk: Secondary | ICD-10-CM | POA: Diagnosis not present

## 2023-12-01 DIAGNOSIS — Z85828 Personal history of other malignant neoplasm of skin: Secondary | ICD-10-CM | POA: Diagnosis not present

## 2023-12-01 DIAGNOSIS — Z08 Encounter for follow-up examination after completed treatment for malignant neoplasm: Secondary | ICD-10-CM | POA: Diagnosis not present

## 2023-12-01 DIAGNOSIS — L821 Other seborrheic keratosis: Secondary | ICD-10-CM | POA: Diagnosis not present

## 2023-12-01 DIAGNOSIS — L814 Other melanin hyperpigmentation: Secondary | ICD-10-CM | POA: Diagnosis not present

## 2024-01-09 ENCOUNTER — Encounter: Payer: Self-pay | Admitting: Family Medicine

## 2024-01-09 ENCOUNTER — Ambulatory Visit (INDEPENDENT_AMBULATORY_CARE_PROVIDER_SITE_OTHER): Payer: Medicare HMO | Admitting: Family Medicine

## 2024-01-09 VITALS — BP 140/70 | HR 71 | Temp 98.1°F | Ht 65.0 in | Wt 145.8 lb

## 2024-01-09 DIAGNOSIS — I10 Essential (primary) hypertension: Secondary | ICD-10-CM | POA: Diagnosis not present

## 2024-01-09 DIAGNOSIS — M810 Age-related osteoporosis without current pathological fracture: Secondary | ICD-10-CM | POA: Diagnosis not present

## 2024-01-09 DIAGNOSIS — Z0000 Encounter for general adult medical examination without abnormal findings: Secondary | ICD-10-CM

## 2024-01-09 DIAGNOSIS — R739 Hyperglycemia, unspecified: Secondary | ICD-10-CM

## 2024-01-09 DIAGNOSIS — I2510 Atherosclerotic heart disease of native coronary artery without angina pectoris: Secondary | ICD-10-CM | POA: Diagnosis not present

## 2024-01-09 DIAGNOSIS — E785 Hyperlipidemia, unspecified: Secondary | ICD-10-CM | POA: Diagnosis not present

## 2024-01-09 DIAGNOSIS — Z131 Encounter for screening for diabetes mellitus: Secondary | ICD-10-CM

## 2024-01-09 MED ORDER — NITROGLYCERIN 0.4 MG SL SUBL: 0.4000 mg | SUBLINGUAL_TABLET | SUBLINGUAL | 2 refills | Status: AC | PRN

## 2024-01-09 NOTE — Patient Instructions (Addendum)
Please stop by lab before you go If you have mychart- we will send your results within 3 business days of [place] receiving them.  If you do not have mychart- we will call you about results within 5 business days of [place] receiving them.  *please also note that you will see labs on mychart as soon as they post. I will later go in and write notes on them- will say "notes from Dr. Durene Cal"   No changes today unless labs lead [place] to make changes  Update me with blood pressure in a week or so on mychart  Recommended follow up: Return in about 6 months (around 07/08/2024) for followup or sooner if needed.Schedule b4 you leave.

## 2024-01-09 NOTE — Progress Notes (Signed)
Phone [phone]   Subjective:  Patient presents today for their annual physical. Chief complaint-noted.   See problem oriented charting- ROS- full  review of systems was completed and negative Per full ROS sheet completed by patient  The following were reviewed and entered/updated in epic: Past Medical History:  Diagnosis Date   ALLERGIC RHINITIS 04/08/2008   Allergy    Basal cell carcinoma    back   CAD (coronary artery disease) 2008   status post anterior myocardial infarction, treated w/ a dru-eluting stent to the LAD   CAD, NATIVE VESSEL 11/10/2009   GERD (gastroesophageal reflux disease)    Hiatal hernia    HYPERLIPIDEMIA 03/29/2008   HYPERTENSION 03/29/2008   Myocardial infarction (HCC)    2008   Osteopenia    Patient Active Problem List   Diagnosis Date Noted   CAD (coronary artery disease) 11/10/2009    Priority: High   Hyperglycemia, unspecified 05/11/2020    Priority: Medium    Osteoporosis     Priority: Medium    Hyperlipidemia 03/29/2008    Priority: Medium    Essential hypertension 03/29/2008    Priority: Medium    S/P laparoscopic assisted vaginal hysterectomy (LAVH) 07/20/2018    Priority: Low   History of basal cell carcinoma of skin     Priority: Low   GERD 05/05/2008    Priority: Low   Allergic rhinitis 04/08/2008    Priority: Low   Left hip pain 02/10/2019    Priority: 1.   Left shoulder pain 02/10/2019    Priority: 1.   Chronic pain of right knee 10/12/2017    Priority: 1.   Past Surgical History:  Procedure Laterality Date   ANTERIOR AND POSTERIOR REPAIR WITH SACROSPINOUS FIXATION N/A 07/20/2018   Procedure: ANTERIOR AND POSTERIOR REPAIR WITH SACROSPINOUS FIXATION;  Surgeon: Richardean Chimera, MD;  Location: [facility];  Service: Gynecology;  Laterality: N/A;   APPENDECTOMY     BASAL CELL CARCINOMA EXCISION     back   BLADDER SUSPENSION N/A 07/20/2018   Procedure: TRANSOBTURATOR Marella Chimes;  Surgeon: Richardean Chimera, MD;  Location: [facility];   Service: Gynecology;  Laterality: N/A;   CATARACT EXTRACTION Bilateral    LAPAROSCOPIC ASSISTED VAGINAL HYSTERECTOMY Bilateral    LAPAROSCOPIC VAGINAL HYSTERECTOMY WITH SALPINGO OOPHORECTOMY Bilateral 07/20/2018   Procedure: LAPAROSCOPIC ASSISTED VAGINAL HYSTERECTOMY WITH SALPINGO OOPHORECTOMY;  Surgeon: Richardean Chimera, MD;  Location: [facility];  Service: Gynecology;  Laterality: Bilateral;   Percutaneous transluminal coronary angioplasty and drug-eluting  09/2007   Stent placement in the left anterior descending   WISDOM TOOTH EXTRACTION      Family History  Problem Relation Age of Onset   Coronary artery disease Mother    Lung cancer Father    Heart disease Brother        stent placed   Coronary artery disease Maternal Uncle    CAD Son        died 58 of MI   Colon cancer Neg Hx    Esophageal cancer Neg Hx    Rectal cancer Neg Hx    Stomach cancer Neg Hx     Medications- reviewed and updated Current Outpatient Medications  Medication Sig Dispense Refill   acetaminophen (TYLENOL) 500 MG tablet Take 1,000 mg by mouth 2 (two) times daily as needed for moderate pain or headache.     aspirin 81 MG tablet Take 81 mg by mouth every evening.     atorvastatin (LIPITOR) 80 MG tablet TAKE 1 TABLET BY MOUTH  ONCE DAILY AS DIRECTED 90 tablet 0   denosumab (PROLIA) 60 MG/ML SOSY injection inject 1 subcutaneous syringe every 6 months to be given in physicians office     diclofenac sodium (VOLTAREN) 1 % GEL Apply 2 g topically 3 (three) times daily as needed. 100 g 5   ezetimibe (ZETIA) 10 MG tablet Take 1 tablet by mouth once daily 90 tablet 0   nitroGLYCERIN (NITROSTAT) 0.4 MG SL tablet Place 1 tablet (0.4 mg total) under the tongue every 5 (five) minutes as needed for chest pain (call 911 if no relief after 2nd dose). 25 tablet 2   No current facility-administered medications for this visit.    Allergies-reviewed and updated Allergies  Allergen Reactions   Penicillins Rash    Has patient had  a PCN reaction causing immediate rash, facial/tongue/throat swelling, SOB or lightheadedness with hypotension: Yes  Has patient had a PCN reaction causing severe rash involving mucus membranes or skin necrosis: No  Has patient had a PCN reaction that required hospitalization: No  Has patient had a PCN reaction occurring within the last 10 years: No  If all of the above answers are "NO", then may proceed with Cephalosporin use.  Other Reaction(s): Not available    Social History   Social History Narrative   Married 1970. 2 living children. 2 living children- had 4 (lost one at age 44 Johnathon drug related to heart attack and one at 30 Mark to heart attack not drug related), 6 grandkids. 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 Oropharynx normal Neck: no thyromegaly CV: RRR no murmurs rubs or gallops Lungs: CTAB no crackles, wheeze, rhonchi Abdomen: soft/nontender/nondistended/normal bowel sounds. No rebound or guarding.  Ext: no edema Skin: warm, dry Neuro: grossly normal, moves all extremities, PERRLA   Assessment and Plan   83 y.o. female presenting for annual physical.  Health Maintenance counseling: 1. Anticipatory guidance: Patient counseled regarding regular dental exams -q6 months, eye exams - did well overall with cataract surgery,  avoiding smoking and second hand smoke , limiting alcohol to 1 beverage per day-doesn't drink , no illicit drugs .   2. Risk factor reduction:  Advised patient of need for regular exercise and diet rich and fruits and vegetables to reduce risk of  heart attack and stroke.  Exercise- planning to try to do some leg exercises and walk when better temperatures.  Diet/weight management-weight up 3 pounds in the last year but still in healthy weight range- plus winter clothes- stable at home around 140.  Wt Readings from Last 3 Encounters:  01/09/24 145 lb 12.8 oz (66.1 kg)  07/07/23 141 lb 9.6 oz (64.2 kg)  05/27/23 139 lb (63 kg)  3. Immunizations/screenings/ancillary studies-fully up-to-date  Immunization History  Administered Date(s) Administered   Fluad Quad(high Dose 65+) 08/25/2019, 11/16/2020, 09/18/2021, 09/27/2022   Fluad Trivalent(High Dose 65+) 10/02/2023   Influenza Split 09/27/2011, 10/13/2012   Influenza Whole 09/29/2008, 09/27/2009, 10/04/2010   Influenza, High Dose  Seasonal PF 09/24/2013, 10/04/2014, 09/28/2015, 09/27/2016, 08/28/2017, 09/21/2018   PFIZER Comirnaty(Gray Top)Covid-19 Tri-Sucrose Vaccine 09/26/2022   PFIZER(Purple Top)SARS-COV-2 Vaccination 01/08/2020, 01/29/2020, 10/17/2020, 03/27/2021   Pfizer Covid-19 Vaccine Bivalent Booster 39yrs & up 11/19/2021   Pfizer(Comirnaty)Fall Seasonal Vaccine 12 years and older 10/02/2023   Pneumococcal Conjugate-13 12/02/2014   Pneumococcal Polysaccharide-23 03/29/2008   Pneumococcal-Unspecified 03/05/2021   Respiratory Syncytial Virus Vaccine,Recomb Aduvanted(Arexvy) 09/26/2022, 10/02/2023   Td 03/29/2008   Tdap 12/02/2014   Zoster Recombinant(Shingrix) 05/26/2018, 08/25/2018   Zoster, Live 02/23/2008  4. Cervical cancer screening- still follows up with gynecology-never had abnormal Pap smear.  Follow Dr. Shelly Bombard in the past   5. Breast cancer screening-  breast exam with gynecology and mammogram - continues to get mammograms but past age based screening recommendations  with gynecology   6. Colon cancer screening - never had abnormal colonoscopy.  We tried to do a final Cologuard 4 years ago but was not covered so we have deferred . No blood in stool or  melena  7. Skin cancer screening- Follows with Dr. Dorita Sciara office every 6 months- has had to have Mohs on nose. Just had visit  8. Birth control/STD check- monogamous and postmenopausal   9. Osteoporosis screening at 63- follows with gynecology physician for women's - see below 10. Smoking associated screening - never  smoker  Status of chronic or acute concerns   #CAD/hyperlipidemia- Sees Dr. Excell Seltzer S: Medication: atorvastatin 80mg , Zetia 10mg .  Aspirin 81 mg.  Nitroglycerin available as needed In regards to CAD-no chest pain or shortness of breath   Lab Results  Component Value Date   CHOL 157 12/26/2022   HDL 70.10 12/26/2022   LDLCALC 70 12/26/2022   LDLDIRECT 57.0 11/11/2018   TRIG 86.0 12/26/2022   CHOLHDL 2 12/26/2022   A/P: coronary artery disease asymptomatic continue current medications Lipids at goal last year- update today  #hypertension-blood pressure tends to run higher in office S: medication: off losartan now- no prescription  -previously Losartan 25mg  down from 100mg  due to orthostatic symptoms -hctz stopped 2021 due to lightheadedness, later stopped coreg 25 mg BID (bradycardia)  Home readings #s: 120s or 130s (most of the time 130s)  A/P: slightly high in office but generally controlled at home- asked her of the update me with readings from home in the next week  # GERD S: Medication: Pepcid 20mg  in the past-in 2022 not having to use other than if eats late A/P: reasonable control with lifestyle change   # Hyperglycemia/insulin resistance/prediabetes-peak A1c 6.24 October 2019 S:  Medication: none Lab Results  Component Value Date   HGBA1C 6.2 07/07/2023   HGBA1C 6.0 12/26/2022   HGBA1C 6.1 12/13/2021   A/P: hopefully stable- update a1c today. Continue without meds for now   % Osteoporosis-managed by Dr[facility].   -on prolia q6 months through GYN -boniva= reflux/chest pain -She is compliant with calcium and vitamin D Last  vitamin D- in 2021- we will update today -continue current medications otherwise   Recommended follow up: Return in about 6 months (around 07/08/2024) for followup or sooner if needed.Schedule b4 you leave. Future Appointments  Date Time Provider Department Center  06/01/2024  7:45 AM [facility] ANNUAL WELLNESS VISIT 1 [facility]   Lab/Order associations:NOT fasting   ICD-10-CM   1. Preventative health care  Z00.00     2. Coronary artery disease involving native coronary artery of native heart without angina pectoris  I25.10     3. Essential hypertension  I10     4. Hyperglycemia, unspecified  R73.9 Hemoglobin A1c    5. Hyperlipidemia, unspecified hyperlipidemia type  E78.5 Comprehensive metabolic panel    CBC with Differential/Platelet    Lipid panel    6. Age-related osteoporosis without current pathological fracture  M81.0 VITAMIN D 25 Hydroxy (Vit-D Deficiency, Fractures)    7. Screening for diabetes mellitus  Z13.1 Hemoglobin A1c      Meds ordered this encounter  Medications   nitroGLYCERIN (NITROSTAT) 0.4 MG SL tablet    Sig: Place 1 tablet (0.4 mg total) under the tongue every 5 (five) minutes as needed for chest pain (call 911 if no relief after 2nd dose).    Dispense:  25 tablet    Refill:  2    Return precautions advised.  Tana Conch, MD

## 2024-01-09 NOTE — Addendum Note (Signed)
Addended by: Shelva Majestic on: 01/09/2024 02:52 PM   Modules accepted: Orders

## 2024-01-10 LAB — LIPID PANEL
Cholesterol: 131 mg/dL (ref <200)
HDL: 64 mg/dL (ref >=50)
LDL Cholesterol (Calc): 54 mg/dL
Non-HDL Cholesterol (Calc): 67 mg/dL (ref <130)
Total CHOL/HDL Ratio: 2 (calc) (ref <5.0)
Triglycerides: 57 mg/dL (ref <150)

## 2024-01-10 LAB — COMPREHENSIVE METABOLIC PANEL
AG Ratio: 1.8 (calc) (ref 1.0–2.5)
ALT: 17 U/L (ref 6–29)
AST: 22 U/L (ref 10–35)
Albumin: 4.1 g/dL (ref 3.6–5.1)
Alkaline phosphatase (APISO): 82 U/L (ref 37–153)
BUN: 23 mg/dL (ref 7–25)
CO2: 26 mmol/L (ref 20–32)
Calcium: 9.2 mg/dL (ref 8.6–10.4)
Chloride: 109 mmol/L (ref 98–110)
Creat: 0.8 mg/dL (ref 0.60–0.95)
Globulin: 2.3 g/dL (ref 1.9–3.7)
Glucose, Bld: 109 mg/dL — ABNORMAL HIGH (ref 65–99)
Potassium: 3.6 mmol/L (ref 3.5–5.3)
Sodium: 142 mmol/L (ref 135–146)
Total Bilirubin: 0.4 mg/dL (ref 0.2–1.2)
Total Protein: 6.4 g/dL (ref 6.1–8.1)

## 2024-01-10 LAB — VITAMIN D 25 HYDROXY (VIT D DEFICIENCY, FRACTURES): Vit D, 25-Hydroxy: 51 ng/mL (ref 30–100)

## 2024-01-10 LAB — CBC WITH DIFFERENTIAL/PLATELET
Absolute Lymphocytes: 2540 {cells}/uL (ref 850–3900)
Absolute Monocytes: 626 {cells}/uL (ref 200–950)
Basophils Absolute: 52 {cells}/uL (ref 0–200)
Basophils Relative: 0.6 %
Eosinophils Absolute: 70 {cells}/uL (ref 15–500)
Eosinophils Relative: 0.8 %
HCT: 38.1 % (ref 35.0–45.0)
Hemoglobin: 12.4 g/dL (ref 11.7–15.5)
MCH: 30.3 pg (ref 27.0–33.0)
MCHC: 32.5 g/dL (ref 32.0–36.0)
MCV: 93.2 fL (ref 80.0–100.0)
MPV: 11 fL (ref 7.5–12.5)
Monocytes Relative: 7.2 %
Neutro Abs: 5411 {cells}/uL (ref 1500–7800)
Neutrophils Relative %: 62.2 %
Platelets: 242 10*3/uL (ref 140–400)
RBC: 4.09 10*6/uL (ref 3.80–5.10)
RDW: 12.7 % (ref 11.0–15.0)
Total Lymphocyte: 29.2 %
WBC: 8.7 10*3/uL (ref 3.8–10.8)

## 2024-01-10 LAB — HEMOGLOBIN A1C
Hgb A1c MFr Bld: 5.8 %{Hb} — ABNORMAL HIGH (ref <5.7)
Mean Plasma Glucose: 120 mg/dL
eAG (mmol/L): 6.6 mmol/L

## 2024-01-12 ENCOUNTER — Encounter: Payer: Self-pay | Admitting: Family Medicine

## 2024-01-28 DIAGNOSIS — M810 Age-related osteoporosis without current pathological fracture: Secondary | ICD-10-CM | POA: Diagnosis not present

## 2024-02-06 DIAGNOSIS — M810 Age-related osteoporosis without current pathological fracture: Secondary | ICD-10-CM | POA: Diagnosis not present

## 2024-02-21 ENCOUNTER — Other Ambulatory Visit: Payer: Self-pay | Admitting: Family Medicine

## 2024-04-20 ENCOUNTER — Encounter: Payer: Self-pay | Admitting: Family Medicine

## 2024-04-20 ENCOUNTER — Ambulatory Visit (INDEPENDENT_AMBULATORY_CARE_PROVIDER_SITE_OTHER)

## 2024-04-20 ENCOUNTER — Other Ambulatory Visit: Payer: Self-pay

## 2024-04-20 ENCOUNTER — Ambulatory Visit: Admitting: Family Medicine

## 2024-04-20 VITALS — BP 138/82 | HR 57 | Ht 65.0 in | Wt 147.0 lb

## 2024-04-20 DIAGNOSIS — M25552 Pain in left hip: Secondary | ICD-10-CM

## 2024-04-20 DIAGNOSIS — I878 Other specified disorders of veins: Secondary | ICD-10-CM | POA: Diagnosis not present

## 2024-04-20 DIAGNOSIS — M1612 Unilateral primary osteoarthritis, left hip: Secondary | ICD-10-CM | POA: Diagnosis not present

## 2024-04-20 NOTE — Patient Instructions (Addendum)
 Thank you for coming in today.   Please work on the home exercises the athletic trainer went over with you:  View at [email or web address] code [email or web address]  Please get an Xray today before you leave   Let me know how it goes. If not better, maybe PT or maybe try a shot

## 2024-04-20 NOTE — Progress Notes (Unsigned)
   I, Jennifer Gross, CMA acting as a scribe for Garlan Juniper, MD.  Jennifer Gross is a 83 y.o. female who presents to [facility] at [facility] today for L hip pain x 1 year. Pt locates pain to lateral aspect of the left hip. Pain will occasionally radiate into the groin and leg, to the knee. Notes short-term relief with Tylenol  and Voltaren  Gel.   Radiates: groin and left Aggravates: TTP, side-lying Treatments tried: Tylenol , Voltaren   Dx testing: 05/08/15 DEXA scan  Pertinent review of systems: No fevers or chills  Relevant historical information: Hypertension and coronary artery disease.   Exam:  BP 138/82   Pulse (!) 57   Ht 5\' 5"  (1.651 m)   Wt 147 lb (66.7 kg)   SpO2 97%   BMI 24.46 kg/m  General: Well Developed, well nourished, and in no acute distress.   MSK: Left hip: Normal-appearing Tender palpation greater trochanter.  Reduced strength hip adduction and external rotation.    Lab and Radiology Results  X-ray images left hip obtained today personally and independently interpreted.  No acute fractures.  Minimal degenerative changes.     Assessment and Plan: 83 y.o. female with left hip pain.  Pain is mostly lateral due to hip abductor tendinitis or greater trochanteric bursitis.  Plan for home exercise program.  Consider physical therapy or injection if not improving. Recheck in about 6 weeks.  PDMP not reviewed this encounter. Orders Placed This Encounter  Procedures   DG HIP UNILAT W OR W/O PELVIS 2-3 VIEWS LEFT    Standing Status:   Future    Number of Occurrences:   1    Expiration Date:   05/20/2024    Reason for Exam (SYMPTOM  OR DIAGNOSIS REQUIRED):   left hip pain    Preferred imaging location?:   [facility]   No orders of the defined types were placed in this encounter.    Discussed warning signs or symptoms. Please see discharge instructions. Patient expresses understanding.   The above documentation has been  reviewed and is accurate and complete Garlan Juniper, M.D.

## 2024-05-04 ENCOUNTER — Ambulatory Visit: Payer: Self-pay | Admitting: Family Medicine

## 2024-05-04 NOTE — Progress Notes (Signed)
Left hip x-ray shows a little bit of arthritis

## 2024-05-26 ENCOUNTER — Other Ambulatory Visit: Payer: Self-pay | Admitting: Family Medicine

## 2024-05-31 ENCOUNTER — Ambulatory Visit: Payer: Medicare HMO | Attending: Cardiovascular Disease | Admitting: Cardiovascular Disease

## 2024-05-31 ENCOUNTER — Encounter: Payer: Self-pay | Admitting: Cardiovascular Disease

## 2024-05-31 VITALS — BP 144/90 | HR 63 | Ht 65.0 in | Wt 143.4 lb

## 2024-05-31 DIAGNOSIS — E782 Mixed hyperlipidemia: Secondary | ICD-10-CM | POA: Diagnosis not present

## 2024-05-31 DIAGNOSIS — I10 Essential (primary) hypertension: Secondary | ICD-10-CM

## 2024-05-31 DIAGNOSIS — I2510 Atherosclerotic heart disease of native coronary artery without angina pectoris: Secondary | ICD-10-CM | POA: Diagnosis not present

## 2024-05-31 MED ORDER — LOSARTAN POTASSIUM 50 MG PO TABS: 50.0000 mg | ORAL_TABLET | Freq: Every day | ORAL | 3 refills | Status: AC

## 2024-05-31 NOTE — Assessment & Plan Note (Addendum)
 Treated with atorvastatin  80 mg daily and Zetia  10 mg daily.  LDL cholesterol is 54.  Lipids are at goal.

## 2024-05-31 NOTE — Assessment & Plan Note (Addendum)
 No anginal symptoms.  Treated with aspirin and atorvastatin .  Continue same therapy.

## 2024-05-31 NOTE — Patient Instructions (Signed)
 Medication Instructions:  START Losartan  50mg  daily *If you need a refill on your cardiac medications before your next appointment, please call your pharmacy*  Follow-Up: At [facility], you and your health needs are our priority.  As part of our continuing mission to provide you with exceptional heart care, our providers are all part of one team.  This team includes your primary Cardiologist (physician) and Advanced Practice Providers or APPs (Physician Assistants and Nurse Practitioners) who all work together to provide you with the care you need, when you need it.  Your next appointment:   1 year(s)  Provider:   Arnoldo Lapping, MD

## 2024-05-31 NOTE — Assessment & Plan Note (Addendum)
 Blood pressure mildly elevated in the office today with a history of whitecoat hypertension in the past.  She had syncope and stopped her antihypertensive medicines a few years ago.  She noted significant improvement in her quality of life after stopping her blood pressure medicines.  After review of her medication history, we decided to have her resume losartan  50 mg daily.  She will continue to monitor her blood pressure.

## 2024-05-31 NOTE — Progress Notes (Signed)
 Cardiology Office Note:    Date:  05/31/2024   ID:  Jennifer Gross, DOB 03-19-1941, MRN [id number]  PCP:  Jennifer Jaeger, MD   [facility] Providers Cardiologist:  Jennifer Lapping, MD     Referring MD: Jennifer Jaeger, MD   Chief Complaint  Patient presents with   Coronary Artery Disease    History of Present Illness:    Jennifer Gross is a 83 y.o. female presenting for follow-up of coronary artery disease.  She initially presented in 2008 with her wall MI treated with overlapping drug-eluting stents in the LAD and balloon angioplasty of the first diagonal.  She has done well ever since that time from a cardiac perspective.  She has had no recurrent ischemic events.  Her last Myoview  stress test in 2023 was normal with an LVEF of 79% and normal LV perfusion.  The patient is here alone today.  She has been doing very well.  She denies any chest pain, chest pressure, or shortness of breath.  No heart palpitations, lightheadedness, or dizziness.  She states that her blood pressure is ranging between normal readings in the 120's and elevated readings in the 140's.  We went back and reviewed her history of antihypertensive medications.  Her antihypertensives had to be discontinued in 2022 because of symptomatic hypotension.  At that time she was taking carvedilol  25 mg twice daily, losartan  100 mg daily, and hydrochlorothiazide  25 mg daily.  She ultimately weaned off of all of these medicines.   Current Medications: Current Meds  Medication Sig   acetaminophen  (TYLENOL ) 500 MG tablet Take 1,000 mg by mouth 2 (two) times daily as needed for moderate pain or headache.   aspirin 81 MG tablet Take 81 mg by mouth every evening.   atorvastatin  (LIPITOR) 80 MG tablet TAKE 1 TABLET BY MOUTH ONCE DAILY AS DIRECTED   denosumab (PROLIA) 60 MG/ML SOSY injection inject 1 subcutaneous syringe every 6 months to be given in physicians office   diclofenac  sodium (VOLTAREN ) 1 % GEL Apply 2 g  topically 3 (three) times daily as needed.   ezetimibe  (ZETIA ) 10 MG tablet Take 1 tablet by mouth once daily   losartan  (COZAAR ) 50 MG tablet Take 1 tablet (50 mg total) by mouth daily.   nitroGLYCERIN  (NITROSTAT ) 0.4 MG SL tablet Place 1 tablet (0.4 mg total) under the tongue every 5 (five) minutes as needed for chest pain (call 911 if no relief after 2nd dose).     Allergies:   Penicillins   ROS:   Please see the history of present illness.    All other systems reviewed and are negative.  EKGs/Labs/Other Studies Reviewed:    The following studies were reviewed today: Cardiac Studies & Procedures   ______________________________________________________________________________________________   STRESS TESTS  MYOCARDIAL PERFUSION IMAGING 04/15/2022  Narrative   The study is normal. The study is low risk.   No ST deviation was noted.   LV perfusion is normal.   Left ventricular function is normal. Nuclear stress EF: 79 %. The left ventricular ejection fraction is hyperdynamic (>65%). End diastolic cavity size is normal.   Prior study not available for comparison.   ECHOCARDIOGRAM  ECHOCARDIOGRAM COMPLETE 04/15/2022  Narrative ECHOCARDIOGRAM REPORT    Patient Name:   Jennifer Gross Date of Exam: 04/15/2022 Medical Rec #:  [id number]       Height:       65.0 in Accession #:    [id number]  Weight:       143.2 lb Date of Birth:  1941/10/20        BSA:          1.716 m Patient Age:    80 years        BP:           203/93 mmHg Patient Gender: F               HR:           66 bpm. Exam Location:  [address]  Procedure: 2D Echo, 3D Echo, Cardiac Doppler and Color Doppler  Indications:    R55 Syncope and collapse  History:        Patient has prior history of Echocardiogram examinations, most recent 12/14/2007. Previous Myocardial Infarction and CAD; Risk Factors:Hypertension and HLD.  Sonographer:    Juventino Oppenheim RCS Referring Phys: [phone] Jennifer Gross  IMPRESSIONS   1. Left ventricular ejection fraction, by estimation, is 65 to 70%. The left ventricle has normal function. The left ventricle has no regional wall motion abnormalities. Left ventricular diastolic parameters were normal. 2. Right ventricular systolic function is normal. The right ventricular size is normal. There is normal pulmonary artery systolic pressure. 3. Mild mitral valve regurgitation. 4. The aortic valve is tricuspid. Aortic valve regurgitation is not visualized. 5. The inferior vena cava is normal in size with greater than 50% respiratory variability, suggesting right atrial pressure of 3 mmHg.  FINDINGS Left Ventricle: Left ventricular ejection fraction, by estimation, is 65 to 70%. The left ventricle has normal function. The left ventricle has no regional wall motion abnormalities. The left ventricular internal cavity size was normal in size. There is no left ventricular hypertrophy. Left ventricular diastolic parameters were normal.  Right Ventricle: The right ventricular size is normal. Right vetricular wall thickness was not assessed. Right ventricular systolic function is normal. There is normal pulmonary artery systolic pressure. The tricuspid regurgitant velocity is 1.43 m/s, and with an assumed right atrial pressure of 3 mmHg, the estimated right ventricular systolic pressure is 11.2 mmHg.  Left Atrium: Left atrial size was normal in size.  Right Atrium: Right atrial size was normal in size.  Pericardium: There is no evidence of pericardial effusion.  Mitral Valve: There is mild thickening of the mitral valve leaflet(s). Mild mitral valve regurgitation.  Tricuspid Valve: The tricuspid valve is normal in structure. Tricuspid valve regurgitation is mild.  Aortic Valve: The aortic valve is tricuspid. Aortic valve regurgitation is not visualized.  Pulmonic Valve: The pulmonic valve was normal in structure. Pulmonic valve regurgitation is not  visualized.  Aorta: The aortic root is normal in size and structure.  Venous: The inferior vena cava is normal in size with greater than 50% respiratory variability, suggesting right atrial pressure of 3 mmHg.  IAS/Shunts: No atrial level shunt detected by color flow Doppler.   LEFT VENTRICLE PLAX 2D LVIDd:         4.00 cm   Diastology LVIDs:         2.70 cm   LV e' medial:    10.00 cm/s LV PW:         0.70 cm   LV E/e' medial:  11.4 LV IVS:        0.80 cm   LV e' lateral:   8.27 cm/s LVOT diam:     1.90 cm   LV E/e' lateral: 13.8 LV SV:         90 LV  SV Index:   53 LVOT Area:     2.84 cm  3D Volume EF: 3D EF:        71 % LV EDV:       95 ml LV ESV:       27 ml LV SV:        67 ml  RIGHT VENTRICLE RV Basal diam:  3.50 cm RV S prime:     16.30 cm/s TAPSE (M-mode): 2.7 cm RVSP:           11.2 mmHg  LEFT ATRIUM             Index        RIGHT ATRIUM           Index LA diam:        2.80 cm 1.63 cm/m   RA Pressure: 3.00 mmHg LA Vol (A2C):   37.3 ml 21.73 ml/m  RA Area:     14.40 cm LA Vol (A4C):   27.5 ml 16.02 ml/m  RA Volume:   34.90 ml  20.33 ml/m LA Biplane Vol: 34.0 ml 19.81 ml/m AORTIC VALVE LVOT Vmax:   133.00 cm/s LVOT Vmean:  92.300 cm/s LVOT VTI:    0.318 m  AORTA Ao Root diam: 3.30 cm Ao Asc diam:  3.10 cm  MITRAL VALVE                TRICUSPID VALVE MV Area (PHT):              TR Peak grad:   8.2 mmHg MV Decel Time:              TR Vmax:        143.00 cm/s MV E velocity: 114.00 cm/s  Estimated RAP:  3.00 mmHg MV A velocity: 114.00 cm/s  RVSP:           11.2 mmHg MV E/A ratio:  1.00 SHUNTS Systemic VTI:  0.32 m Systemic Diam: 1.90 cm  Ola Berger MD Electronically signed by Ola Berger MD Signature Date/Time: 04/15/2022/2:10:49 PM    Final          ______________________________________________________________________________________________      EKG:   EKG Interpretation Date/Time:  Monday May 31 2024 09:51:49 EDT Ventricular Rate:   63 PR Interval:  162 QRS Duration:  130 QT Interval:  464 QTC Calculation: 474 R Axis:   59  Text Interpretation: Normal sinus rhythm Right bundle branch block When compared with ECG of 15-Apr-2022 11:31, Vent. rate has decreased BY  35 BPM Otherwise no significant change Confirmed by Jennifer Gross [phone]) on 05/31/2024 10:03:50 AM    Recent Labs: 01/09/2024: ALT 17; BUN 23; Creat 0.80; Hemoglobin 12.4; Platelets 242; Potassium 3.6; Sodium 142  Recent Lipid Panel    Component Value Date/Time   CHOL 131 01/09/2024 1455   TRIG 57 01/09/2024 1455   HDL 64 01/09/2024 1455   CHOLHDL 2.0 01/09/2024 1455   VLDL 17.2 12/26/2022 1404   LDLCALC 54 01/09/2024 1455   LDLDIRECT 57.0 11/11/2018 1051           Physical Exam:    VS:  BP (!) 144/90   Pulse 63   Ht 5\' 5"  (1.651 m)   Wt 143 lb 6.4 oz (65 kg)   SpO2 99%   BMI 23.86 kg/m     Wt Readings from Last 3 Encounters:  05/31/24 143 lb 6.4 oz (65 kg)  04/20/24 147 lb (66.7 kg)  01/09/24 145 lb 12.8  oz (66.1 kg)     GEN:  Well nourished, well developed in no acute distress HEENT: Normal NECK: No JVD; No carotid bruits LYMPHATICS: No lymphadenopathy CARDIAC: RRR, no murmurs, rubs, gallops RESPIRATORY:  Clear to auscultation without rales, wheezing or rhonchi  ABDOMEN: Soft, non-tender, non-distended MUSCULOSKELETAL:  No edema; No deformity  SKIN: Warm and dry NEUROLOGIC:  Alert and oriented x 3 PSYCHIATRIC:  Normal affect   Assessment & Plan Essential hypertension Blood pressure mildly elevated in the office today with a history of whitecoat hypertension in the past.  She had syncope and stopped her antihypertensive medicines a few years ago.  She noted significant improvement in her quality of life after stopping her blood pressure medicines.  After review of her medication history, we decided to have her resume losartan  50 mg daily.  She will continue to monitor her blood pressure. Mixed hyperlipidemia Treated with  atorvastatin  80 mg daily and Zetia  10 mg daily.  LDL cholesterol is 54.  Lipids are at goal. Coronary artery disease involving native coronary artery of native heart without angina pectoris No anginal symptoms.  Treated with aspirin and atorvastatin .  Continue same therapy.      Medication Adjustments/Labs and Tests Ordered: Current medicines are reviewed at length with the patient today.  Concerns regarding medicines are outlined above.  Orders Placed This Encounter  Procedures   EKG 12-Lead   Meds ordered this encounter  Medications   losartan  (COZAAR ) 50 MG tablet    Sig: Take 1 tablet (50 mg total) by mouth daily.    Dispense:  90 tablet    Refill:  3    Patient Instructions  Medication Instructions:  START Losartan  50mg  daily *If you need a refill on your cardiac medications before your next appointment, please call your pharmacy*  Follow-Up: At [facility], you and your health needs are our priority.  As part of our continuing mission to provide you with exceptional heart care, our providers are all part of one team.  This team includes your primary Cardiologist (physician) and Advanced Practice Providers or APPs (Physician Assistants and Nurse Practitioners) who all work together to provide you with the care you need, when you need it.  Your next appointment:   1 year(s)  Provider:   Arnoldo Lapping, MD       Signed, Jennifer Lapping, MD  05/31/2024 10:20 AM    [facility]

## 2024-06-01 ENCOUNTER — Ambulatory Visit: Payer: Medicare HMO

## 2024-06-14 DIAGNOSIS — L814 Other melanin hyperpigmentation: Secondary | ICD-10-CM | POA: Diagnosis not present

## 2024-06-14 DIAGNOSIS — D225 Melanocytic nevi of trunk: Secondary | ICD-10-CM | POA: Diagnosis not present

## 2024-06-14 DIAGNOSIS — Z85828 Personal history of other malignant neoplasm of skin: Secondary | ICD-10-CM | POA: Diagnosis not present

## 2024-06-14 DIAGNOSIS — Z08 Encounter for follow-up examination after completed treatment for malignant neoplasm: Secondary | ICD-10-CM | POA: Diagnosis not present

## 2024-06-14 DIAGNOSIS — L570 Actinic keratosis: Secondary | ICD-10-CM | POA: Diagnosis not present

## 2024-06-14 DIAGNOSIS — L821 Other seborrheic keratosis: Secondary | ICD-10-CM | POA: Diagnosis not present

## 2024-06-14 DIAGNOSIS — L538 Other specified erythematous conditions: Secondary | ICD-10-CM | POA: Diagnosis not present

## 2024-06-14 DIAGNOSIS — D045 Carcinoma in situ of skin of trunk: Secondary | ICD-10-CM | POA: Diagnosis not present

## 2024-06-14 DIAGNOSIS — X32XXXA Exposure to sunlight, initial encounter: Secondary | ICD-10-CM | POA: Diagnosis not present

## 2024-06-14 DIAGNOSIS — D492 Neoplasm of unspecified behavior of bone, soft tissue, and skin: Secondary | ICD-10-CM | POA: Diagnosis not present

## 2024-06-23 ENCOUNTER — Telehealth: Payer: Self-pay | Admitting: Family Medicine

## 2024-06-23 NOTE — Telephone Encounter (Signed)
 Called pt [phone] and left VM to call the office to review results. Can also review via MyChart.

## 2024-06-23 NOTE — Telephone Encounter (Signed)
 Returned pt's call and she notes that despite working on home exercises, her hip pain has worsened. She is wondering about trying an injection into her hip might help. This was mentioned in Dr. Joane plan. F/u visit scheduled for 7/11.

## 2024-06-23 NOTE — Telephone Encounter (Signed)
 Patient called asking if someone would be able to call her to review her MRI results from 04/20/2024. She did receive the report in the mail.  Please call her at [phone].

## 2024-06-23 NOTE — Telephone Encounter (Signed)
 I do not see MRI but there was XR that was done on 04/20/24:   Jennifer GORMAN Lloyd, MD 05/04/2024  7:40 AM EDT     Left hip x-ray shows a little bit of arthritis.

## 2024-07-02 ENCOUNTER — Encounter: Payer: Self-pay | Admitting: Family Medicine

## 2024-07-02 ENCOUNTER — Other Ambulatory Visit: Payer: Self-pay

## 2024-07-02 ENCOUNTER — Ambulatory Visit: Admitting: Family Medicine

## 2024-07-02 VITALS — BP 130/84 | HR 84 | Ht 65.0 in | Wt 136.0 lb

## 2024-07-02 DIAGNOSIS — M25552 Pain in left hip: Secondary | ICD-10-CM

## 2024-07-02 NOTE — Progress Notes (Signed)
   I, Leotis Batter, CMA acting as a scribe for Artist Lloyd, MD.  Jennifer Gross is a 83 y.o. female who presents to [facility] today for worsening L hip pain. Pt was last seen by Dr. Lloyd on 04/20/24 and was taught HEP.  Today, pt reports worsening left hip pain. HEP exacerbates sx. Focal tenderness at lateral aspect of the hip. Pain radiating into the groin. Occasional pain anterior thigh. Using Voltaren  or taking tylenol  prn with minimal relief.   Dx testing: 04/20/24 L hip XR 05/08/15 DEXA scan   Pertinent review of systems: No fevers or chills  Relevant historical information: Hypertension   Exam:  BP 130/84   Pulse 84   Ht 5' 5 (1.651 m)   Wt 136 lb (61.7 kg)   SpO2 97%   BMI 22.63 kg/m  General: Well Developed, well nourished, and in no acute distress.   MSK: Left hip normal-appearing Normal motion. Tender palpation greater trochanter.  Hip abduction strength is diminished.    Lab and Radiology Results  Procedure: Real-time Ultrasound Guided Injection of left lateral hip greater trochanter bursa Device: Philips Affiniti 50G/GE Logiq Images permanently stored and available for review in PACS Verbal informed consent obtained.  Discussed risks and benefits of procedure. Warned about infection, bleeding, hyperglycemia damage to structures among others. Patient expresses understanding and agreement Time-out conducted.   Noted no overlying erythema, induration, or other signs of local infection.   Skin prepped in a sterile fashion.   Local anesthesia: Topical Ethyl chloride.   With sterile technique and under real time ultrasound guidance: 40 mg of Kenalog and 2 mL's of Marcaine  injected into bursa. Fluid seen entering the bursa.   Completed without difficulty   Pain immediately resolved suggesting accurate placement of the medication.   Advised to call if fevers/chills, erythema, induration, drainage, or persistent bleeding.   Images  permanently stored and available for review in the ultrasound unit.  Impression: Technically successful ultrasound guided injection.        Assessment and Plan: 83 y.o. female with left lateral hip pain due to greater trochanteric bursitis.  Plan for steroid injection today.  I reemphasized the importance of physical therapy.  She is reluctant to consider it.  She will let me know if she needs it or would like it.  Would pick the [facility] location.   PDMP not reviewed this encounter. Orders Placed This Encounter  Procedures   US  LIMITED JOINT SPACE STRUCTURES LOW LEFT(NO LINKED CHARGES)    Reason for Exam (SYMPTOM  OR DIAGNOSIS REQUIRED):   left hip pain    Preferred imaging location?:   [facility]   No orders of the defined types were placed in this encounter.    Discussed warning signs or symptoms. Please see discharge instructions. Patient expresses understanding.   The above documentation has been reviewed and is accurate and complete Artist Lloyd, M.D.

## 2024-07-02 NOTE — Patient Instructions (Addendum)
 Thank you for coming in today.   You received an injection today. Seek immediate medical attention if the joint becomes red, extremely painful, or is oozing fluid.   Let us  know if you would like to start PT.   Just a reminder that Dr. Joane will be out of the office for the month of August through Sept 14th, returning on Sept 15th.   See you back as needed.

## 2024-07-08 ENCOUNTER — Encounter: Payer: Self-pay | Admitting: Family Medicine

## 2024-07-08 ENCOUNTER — Ambulatory Visit (INDEPENDENT_AMBULATORY_CARE_PROVIDER_SITE_OTHER): Payer: Medicare HMO | Admitting: Family Medicine

## 2024-07-08 VITALS — BP 130/64 | HR 74 | Temp 97.7°F | Ht 65.0 in | Wt 143.0 lb

## 2024-07-08 DIAGNOSIS — R739 Hyperglycemia, unspecified: Secondary | ICD-10-CM

## 2024-07-08 DIAGNOSIS — Z131 Encounter for screening for diabetes mellitus: Secondary | ICD-10-CM | POA: Diagnosis not present

## 2024-07-08 DIAGNOSIS — I2510 Atherosclerotic heart disease of native coronary artery without angina pectoris: Secondary | ICD-10-CM

## 2024-07-08 DIAGNOSIS — I10 Essential (primary) hypertension: Secondary | ICD-10-CM | POA: Diagnosis not present

## 2024-07-08 DIAGNOSIS — E785 Hyperlipidemia, unspecified: Secondary | ICD-10-CM

## 2024-07-08 MED ORDER — OMEPRAZOLE 20 MG PO CPDR: 20.0000 mg | DELAYED_RELEASE_CAPSULE | Freq: Every day | ORAL | 3 refills | Status: AC

## 2024-07-08 NOTE — Progress Notes (Signed)
 Phone [phone] In person visit   Subjective:   Jennifer Gross is a 83 y.o. year old very pleasant female patient who presents for/with See problem oriented charting Chief Complaint  Patient presents with   Medical Management of Chronic Issues   Hypertension    Past Medical History-  Patient Active Problem List   Diagnosis Date Noted   CAD (coronary artery disease) 11/10/2009    Priority: High   Hyperglycemia, unspecified 05/11/2020    Priority: Medium    Osteoporosis     Priority: Medium    Hyperlipidemia 03/29/2008    Priority: Medium    Essential hypertension 03/29/2008    Priority: Medium    S/P laparoscopic assisted vaginal hysterectomy (LAVH) 07/20/2018    Priority: Low   History of basal cell carcinoma of skin     Priority: Low   GERD 05/05/2008    Priority: Low   Allergic rhinitis 04/08/2008    Priority: Low   Left hip pain 02/10/2019    Priority: 1.   Left shoulder pain 02/10/2019    Priority: 1.   Chronic pain of right knee 10/12/2017    Priority: 1.    Medications- reviewed and updated Current Outpatient Medications  Medication Sig Dispense Refill   acetaminophen  (TYLENOL ) 500 MG tablet Take 1,000 mg by mouth 2 (two) times daily as needed for moderate pain or headache.     aspirin 81 MG tablet Take 81 mg by mouth every evening.     atorvastatin  (LIPITOR) 80 MG tablet TAKE 1 TABLET BY MOUTH ONCE DAILY AS DIRECTED 90 tablet 0   denosumab (PROLIA) 60 MG/ML SOSY injection inject 1 subcutaneous syringe every 6 months to be given in physicians office     diclofenac  sodium (VOLTAREN ) 1 % GEL Apply 2 g topically 3 (three) times daily as needed. 100 g 5   ezetimibe  (ZETIA ) 10 MG tablet Take 1 tablet by mouth once daily 90 tablet 0   losartan  (COZAAR ) 50 MG tablet Take 1 tablet (50 mg total) by mouth daily. 90 tablet 3   nitroGLYCERIN  (NITROSTAT ) 0.4 MG SL tablet Place 1 tablet (0.4 mg total) under the tongue every 5 (five) minutes as needed for chest pain  (call 911 if no relief after 2nd dose). 25 tablet 2   omeprazole  (PRILOSEC) 20 MG capsule Take 1 capsule (20 mg total) by mouth daily. 30 capsule 3   No current facility-administered medications for this visit.     Objective:  BP 130/64   Pulse 74   Temp 97.7 F (36.5 C)   Ht 5' 5 (1.651 m)   Wt 143 lb (64.9 kg)   SpO2 96%   BMI 23.80 kg/m  Gen: NAD, resting comfortably CV: RRR no murmurs rubs or gallops Lungs: CTAB no crackles, wheeze, rhonchi Ext: no edema Skin: warm, dry     Assessment and Plan   #Left hip pain- did better with shot last week but just a few days. Has some exercises she needs to do to help and may do formal physical therapy if not improving  #CAD/hyperlipidemia- Sees Dr. Wonda S: Medication: atorvastatin  80mg , Zetia  10mg .  Aspirin 81 mg.  Nitroglycerin  available as needed- not needing  In regards to CAD- no chest pain or shortness of breath  Lab Results  Component Value Date   CHOL 131 01/09/2024   HDL 64 01/09/2024   LDLCALC 54 01/09/2024   LDLDIRECT 57.0 11/11/2018   TRIG 57 01/09/2024   CHOLHDL 2.0 01/09/2024   A/P:  coronary artery disease asymptomatic. Lipids at goal with LDL under 70 and even better under 55- continue current medications   #hypertension-blood pressure tends to run higher in office S: medication: losartan  50 mg- if blood pressure is in the 120s at home feels weak and tired and no energy. Not lightheaded though. Feels better in 130s- mostly stays in 130s.  - does not hydrate well and that may help if more consistent A/P: blood pressure very well controlled at home and in office- continue current medications with losartan  50 mg but if blood pressure in 110s or 120s at home can try to push fluids or even have some salt/electrolytes like Gatorade zero with prediabetes   # GERD S: Medication: Pepcid  20mg  once a day and despite this has noted reflux and throat clearing with cough. Takes Pepcid  before breakfast. Usually at night  symptoms worsen and has to sit up - can't lay down or will be worse.  -typical for her with reflux A/P: reflux symptoms worsening. Shed like to retrial proton pump inhibitor (PPI) stomach acid reducer like omeprazole  20 mg for at least 1-2 months but if improving can transition back to Pepcid  but move to before dinner only or twice daily before meals   # Hyperglycemia/insulin resistance/prediabetes-peak A1c 6.24 October 2019 S:  Medication: none Lab Results  Component Value Date   HGBA1C 5.8 (H) 01/09/2024   HGBA1C 6.2 07/07/2023   HGBA1C 6.0 12/26/2022   A/P: hopefully stable- update a1c next visit. Continue without meds for now   Recommended follow up: Return in about 6 months (around 01/08/2025) for physical or sooner if needed.Schedule b4 you leave.  Lab/Order associations:   ICD-10-CM   1. Essential hypertension  I10     2. Hyperlipidemia, unspecified hyperlipidemia type  E78.5     3. Hyperglycemia, unspecified  R73.9     4. Screening for diabetes mellitus  Z13.1     5. Coronary artery disease involving native coronary artery of native heart without angina pectoris  I25.10      Meds ordered this encounter  Medications   omeprazole  (PRILOSEC) 20 MG capsule    Sig: Take 1 capsule (20 mg total) by mouth daily.    Dispense:  30 capsule    Refill:  3    Return precautions advised.  Garnette Lukes, MD

## 2024-07-08 NOTE — Patient Instructions (Addendum)
 blood pressure very well controlled at home and in office- continue current medications with losartan  50 mg but if blood pressure in 110s or 120s at home can try to push fluids or even have some salt/electrolytes like Gatorade zero with prediabetes    reflux symptoms worsening. Shed like to retrial proton pump inhibitor (PPI) stomach acid reducer like omeprazole  20 mg for at least 1-2 months but if improving can transition back to Pepcid  but move to before dinner only or twice daily before meals  - if NOT improving let me know if the next month or so or if shortness of breath or any other symptoms  Please stop by lab before you go If you have mychart- we will send your results within 3 business days of us  receiving them.  If you do not have mychart- we will call you about results within 5 business days of us  receiving them.  *please also note that you will see labs on mychart as soon as they post. I will later go in and write notes on them- will say notes from Dr. Katrinka   Recommended follow up: Return in about 6 months (around 01/08/2025) for physical or sooner if needed.Schedule b4 you leave.

## 2024-07-09 ENCOUNTER — Encounter: Payer: Self-pay | Admitting: Advanced Practice Midwife

## 2024-07-09 LAB — COMPREHENSIVE METABOLIC PANEL WITH GFR
ALT: 16 U/L (ref 0–35)
AST: 20 U/L (ref 0–37)
Albumin: 4.3 g/dL (ref 3.5–5.2)
Alkaline Phosphatase: 68 U/L (ref 39–117)
BUN: 32 mg/dL — ABNORMAL HIGH (ref 6–23)
CO2: 26 meq/L (ref 19–32)
Calcium: 10 mg/dL (ref 8.4–10.5)
Chloride: 103 meq/L (ref 96–112)
Creatinine, Ser: 0.91 mg/dL (ref 0.40–1.20)
GFR: 58.2 mL/min — ABNORMAL LOW (ref >60.00)
Glucose, Bld: 80 mg/dL (ref 70–99)
Potassium: 4.1 meq/L (ref 3.5–5.1)
Sodium: 139 meq/L (ref 135–145)
Total Bilirubin: 0.7 mg/dL (ref 0.2–1.2)
Total Protein: 7.1 g/dL (ref 6.0–8.3)

## 2024-07-09 LAB — HEMOGLOBIN A1C: Hgb A1c MFr Bld: 6.4 % (ref 4.6–6.5)

## 2024-07-12 ENCOUNTER — Other Ambulatory Visit: Payer: Self-pay | Admitting: Family Medicine

## 2024-07-12 ENCOUNTER — Ambulatory Visit: Payer: Self-pay | Admitting: Family Medicine

## 2024-07-13 DIAGNOSIS — Z1231 Encounter for screening mammogram for malignant neoplasm of breast: Secondary | ICD-10-CM | POA: Diagnosis not present

## 2024-07-13 DIAGNOSIS — M8588 Other specified disorders of bone density and structure, other site: Secondary | ICD-10-CM | POA: Diagnosis not present

## 2024-07-13 DIAGNOSIS — N958 Other specified menopausal and perimenopausal disorders: Secondary | ICD-10-CM | POA: Diagnosis not present

## 2024-07-13 DIAGNOSIS — Z6824 Body mass index (BMI) 24.0-24.9, adult: Secondary | ICD-10-CM | POA: Diagnosis not present

## 2024-07-13 DIAGNOSIS — Z01419 Encounter for gynecological examination (general) (routine) without abnormal findings: Secondary | ICD-10-CM | POA: Diagnosis not present

## 2024-08-05 DIAGNOSIS — M810 Age-related osteoporosis without current pathological fracture: Secondary | ICD-10-CM | POA: Diagnosis not present

## 2024-08-18 ENCOUNTER — Other Ambulatory Visit: Payer: Self-pay | Admitting: Family Medicine

## 2024-09-14 DIAGNOSIS — M79671 Pain in right foot: Secondary | ICD-10-CM | POA: Diagnosis not present

## 2024-09-14 DIAGNOSIS — S91301A Unspecified open wound, right foot, initial encounter: Secondary | ICD-10-CM | POA: Diagnosis not present

## 2024-09-22 ENCOUNTER — Ambulatory Visit: Admitting: Family Medicine

## 2024-09-22 ENCOUNTER — Other Ambulatory Visit: Payer: Self-pay

## 2024-09-22 VITALS — BP 154/88 | HR 75 | Ht 65.0 in | Wt 142.0 lb

## 2024-09-22 DIAGNOSIS — M25552 Pain in left hip: Secondary | ICD-10-CM

## 2024-09-22 NOTE — Patient Instructions (Addendum)
 Thank you for coming in today.   You should hear from MRI scheduling within 1 week. If you do not hear please let me know.    Let's see what the MRI shows and go from there

## 2024-09-22 NOTE — Progress Notes (Unsigned)
   Jennifer Ileana Collet, PhD, LAT, ATC acting as a scribe for Artist Lloyd, MD.  Jennifer Gross is a 83 y.o. female who presents to [facility] at [facility] today for exacerbation of her L hip. Pt was last seen by Dr. Lloyd on 07/02/24 and was given a L GT steroid injection and PT was re-emphasized.   Today, pt reports prior L GT steroid injection was not helpful. She's be working on HEP, but her hip pain has worsened. Pain is located along the lateral aspect of her L hip and the anterior aspect of her L thigh. No LBP.   Dx testing: 04/20/24 L hip XR 05/08/15 DEXA scan  Pertinent review of systems: No fevers or chills  Relevant historical information: Hypertension   Exam:  BP (!) 154/88   Pulse 75   Ht 5' 5 (1.651 m)   Wt 142 lb (64.4 kg)   SpO2 98%   BMI 23.63 kg/m  General: Well Developed, well nourished, and in no acute distress.   MSK: Left hip normal-appearing normal motion.  Tender palpation lateral hip mildly.    Lab and Radiology Results  EXAM: DG HIP (WITH OR WITHOUT PELVIS) 2-3V LEFT   COMPARISON:  Left hip radiograph 12/30/2018   FINDINGS: No evidence of fracture of the pelvis or left hip. Left hip joint space is preserved. Slight bilateral acetabular spurring. No erosion or avascular necrosis. No evidence of focal bone lesion. Pubic symphysis and sacroiliac joints are congruent. Stable pelvic phleboliths.   IMPRESSION: Minor degenerative change, stable from 2020.     Electronically Signed   By: Andrea Gasman M.D.   On: 05/03/2024 23:13 I, Artist Lloyd, personally (independently) visualized and performed the interpretation of the images attached in this note.      Assessment and Plan: 83 y.o. female with right lateral hip pain.  She also has some anterior groin and thigh pain.  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  She unfortunately has not improved with typical conservative management including greater trochanter  injections and home exercise program directed by physician.  At this point plan for MRI of the hip to further characterize source of pain.  If we do not get a good explanation with the hip MRI I would consider lumbar spine imaging x-ray and MRI.   PDMP not reviewed this encounter. Orders Placed This Encounter  Procedures   MR HIP LEFT WO CONTRAST    Standing Status:   Future    Expiration Date:   10/23/2024    What is the patient's sedation requirement?:   No Sedation    Does the patient have a pacemaker or implanted devices?:   No    Preferred imaging location?:   [facility] (table limit-550lbs)   No orders of the defined types were placed in this encounter.    Discussed warning signs or symptoms. Please see discharge instructions. Patient expresses understanding.   The above documentation has been reviewed and is accurate and complete Artist Lloyd, M.D.

## 2024-10-06 DIAGNOSIS — L910 Hypertrophic scar: Secondary | ICD-10-CM | POA: Diagnosis not present

## 2024-10-06 DIAGNOSIS — L570 Actinic keratosis: Secondary | ICD-10-CM | POA: Diagnosis not present

## 2024-10-06 DIAGNOSIS — D0462 Carcinoma in situ of skin of left upper limb, including shoulder: Secondary | ICD-10-CM | POA: Diagnosis not present

## 2024-10-14 ENCOUNTER — Ambulatory Visit
Admission: RE | Admit: 2024-10-14 | Discharge: 2024-10-14 | Disposition: A | Discharge status: Home / Self Care | Source: Ambulatory Visit | Attending: Family Medicine | Admitting: Family Medicine

## 2024-10-14 DIAGNOSIS — M25552 Pain in left hip: Secondary | ICD-10-CM

## 2024-10-14 DIAGNOSIS — S76012A Strain of muscle, fascia and tendon of left hip, initial encounter: Secondary | ICD-10-CM | POA: Diagnosis not present

## 2024-10-15 ENCOUNTER — Ambulatory Visit: Payer: Self-pay | Admitting: Family Medicine

## 2024-10-15 NOTE — Progress Notes (Signed)
 Left hip MRI shows partial tearing and tendinitis of the hip abductor tendons and hip bursitis.  This does a pretty good job of explaining your pain.  Please schedule a follow-up appointment with me in the near future to go over the results in full detail and discuss treatment plan and options.

## 2024-10-26 ENCOUNTER — Ambulatory Visit

## 2024-10-26 ENCOUNTER — Encounter: Payer: Self-pay | Admitting: Family Medicine

## 2024-10-26 ENCOUNTER — Ambulatory Visit: Admitting: Family Medicine

## 2024-10-26 VITALS — BP 132/80 | HR 74 | Ht 65.0 in | Wt 141.0 lb

## 2024-10-26 DIAGNOSIS — M25552 Pain in left hip: Secondary | ICD-10-CM | POA: Diagnosis not present

## 2024-10-26 DIAGNOSIS — M47817 Spondylosis without myelopathy or radiculopathy, lumbosacral region: Secondary | ICD-10-CM | POA: Diagnosis not present

## 2024-10-26 DIAGNOSIS — M5442 Lumbago with sciatica, left side: Secondary | ICD-10-CM

## 2024-10-26 DIAGNOSIS — I700 Atherosclerosis of aorta: Secondary | ICD-10-CM | POA: Diagnosis not present

## 2024-10-26 DIAGNOSIS — G8929 Other chronic pain: Secondary | ICD-10-CM

## 2024-10-26 DIAGNOSIS — M545 Low back pain, unspecified: Secondary | ICD-10-CM | POA: Diagnosis not present

## 2024-10-26 DIAGNOSIS — M4807 Spinal stenosis, lumbosacral region: Secondary | ICD-10-CM | POA: Diagnosis not present

## 2024-10-26 MED ORDER — TIZANIDINE HCL 2 MG PO TABS: 2.0000 mg | ORAL_TABLET | Freq: Three times a day (TID) | ORAL | 1 refills | Status: AC | PRN

## 2024-10-26 NOTE — Patient Instructions (Addendum)
 Thank you for coming in today.   Please get an Xray today before you leave   I've referred you to Physical Therapy here, at this office.  You will hear from our office soon about scheduling, once we check with your insurance company.   You should hear from MRI scheduling within 1 week. If you do not hear please let me know.

## 2024-10-26 NOTE — Progress Notes (Signed)
 I, Leotis Batter, CMA acting as a scribe for Artist Lloyd, MD.  Jennifer Gross is a 83 y.o. female who presents to [facility] at [facility] today for f/u L hip pain w/ MRI review. Pt was last seen by Dr. Lloyd on 09/22/24 and a MRI was ordered.  Today, pt reports worsening of hip sx. Pain is becoming more constant. Pain radiating into the groin and thigh. Now having pain even while sitting. No relief with Tylenol .   Dx testing: 10/14/24 L hip MRI 04/20/24 L hip XR 05/08/15 DEXA scan  Pertinent review of systems: No fevers or chills  Relevant historical information: Hypertension   Exam:  BP 132/80   Pulse 74   Ht 5' 5 (1.651 m)   Wt 141 lb (64 kg)   SpO2 97%   BMI 23.46 kg/m  General: Well Developed, well nourished, and in no acute distress.   MSK: L-spine decreased lumbar motion.  Lower extremity strength is reduced abduction left hip Reduced range of motion left hip. Reflexes are intact.   Lab and Radiology Results  X-ray images lumbar spine obtained today personally and independently interpreted. DDD L5-S1.  No acute fractures.  Abdominal aorta atherosclerosis is visible. Await formal radiology review  CLINICAL HISTORY: 83 years, Female, Hip pain, chronic, tendon/bursal abnormality suspected, xray done   COMPARISON: None   TECHNIQUE: MRI of the hip was performed with multiplanar multi sequence imaging according to our usual protocol.   FINDINGS: No fracture. No erosions. The sacroiliac joints are unremarkable. No significant degenerative change to the hips. The marrow signal is unremarkable.   No labral tear. No joint effusion. Mild to moderate greater trochanteric bursal fluid. Components of partial tearing to the junctional insertional fibers of the gluteus medius and minimus on the right. Strain/mild intramuscular tear to the lateral gluteus maximus musculature. The tendons and musculature are otherwise unremarkable.    IMPRESSION: Components of partial tearing to the junctional insertional fibers of the gluteus medius and minimus on the left. Mild to moderate likely reactive greater trochanteric bursal fluid.   There is also a strain/mild intramuscular tear to the lateral aspect of the gluteus maximus.   Electronically signed by: Reyes Frees MD 10/14/2024 04:37 PM EDT RP Workstation: [id number]   LILLETTE Artist Lloyd, personally (independently) visualized and performed the interpretation of the images attached in this note.   Assessment and Plan: 83 y.o. female with left hip and leg pain.  Pain is located in the lateral hip and anterior hip and thigh.  MRI of the hip does explain the lateral hip pain with trochanteric bursitis and tendinitis.  However it does not explain her anterior hip pain very well at all.  Plan for physical therapy and lumbar spine MRI to further characterize source of pain.  Anticipate recheck after lumbar spine MRI or potential epidural steroid injection.  Tizanidine prescribed today as well for nighttime pain.   PDMP not reviewed this encounter. Orders Placed This Encounter  Procedures   DG Lumbar Spine 2-3 Views    Standing Status:   Future    Number of Occurrences:   1    Expiration Date:   11/25/2024    Reason for Exam (SYMPTOM  OR DIAGNOSIS REQUIRED):   low back pain    Preferred imaging location?:    [place]   MR Lumbar Spine Wo Contrast    Standing Status:   Future    Expiration Date:   10/26/2025    What is  the patient's sedation requirement?:   No Sedation    Does the patient have a pacemaker or implanted devices?:   No    Preferred imaging location?:   [facility] (table limit-550lbs)   Ambulatory referral to Physical Therapy    Referral Priority:   Routine    Referral Type:   Physical Medicine    Referral Reason:   Specialty Services Required    Requested Specialty:   Physical Therapy    Number of Visits Requested:   1   No orders of the  defined types were placed in this encounter.    Discussed warning signs or symptoms. Please see discharge instructions. Patient expresses understanding.   The above documentation has been reviewed and is accurate and complete Artist Lloyd, M.D.

## 2024-10-27 ENCOUNTER — Ambulatory Visit

## 2024-10-27 VITALS — BP 123/65 | HR 57 | Ht 65.0 in | Wt 141.0 lb

## 2024-10-27 DIAGNOSIS — Z0000 Encounter for general adult medical examination without abnormal findings: Secondary | ICD-10-CM

## 2024-10-27 NOTE — Patient Instructions (Signed)
 [name],  Thank you for taking the time for your Medicare Wellness Visit. I appreciate your continued commitment to your health goals. Please review the care plan we discussed, and feel free to reach out if I can assist you further.  Please note that Annual Wellness Visits do not include a physical exam. Some assessments may be limited, especially if the visit was conducted virtually. If needed, we may recommend an in-person follow-up with your provider.  Ongoing Care Seeing your primary care provider every 3 to 6 months helps us  monitor your health and provide consistent, personalized care.   Referrals If a referral was made during today's visit and you haven't received any updates within two weeks, please contact the referred provider directly to check on the status.  Recommended Screenings:  Health Maintenance  Topic Date Due   Flu Shot  07/23/2024   COVID-19 Vaccine (8 - 2025-26 season) 08/23/2024   DTaP/Tdap/Td vaccine (3 - Td or Tdap) 12/02/2024   Medicare Annual Wellness Visit  10/27/2025   Pneumococcal Vaccine for age over 40  Completed   DEXA scan (bone density measurement)  Completed   Zoster (Shingles) Vaccine  Completed   Meningitis B Vaccine  Aged Out       10/27/2024    9:29 AM  Advanced Directives  Does Patient Have a Medical Advance Directive? No  Would patient like information on creating a medical advance directive? No - Patient declined    Vision: Annual vision screenings are recommended for early detection of glaucoma, cataracts, and diabetic retinopathy. These exams can also reveal signs of chronic conditions such as diabetes and high blood pressure.  Dental: Annual dental screenings help detect early signs of oral cancer, gum disease, and other conditions linked to overall health, including heart disease and diabetes.  Please see the attached documents for additional preventive care recommendations.

## 2024-10-27 NOTE — Progress Notes (Addendum)
 Subjective:   Jennifer Gross is a 83 y.o. female who presents for a Medicare Annual Wellness Visit. I connected with  Jennifer Gross on 10/27/24 by a audio enabled telemedicine application and verified that I am speaking with the correct person using two identifiers.  Patient Location: Home  Provider Location: Home Office  I discussed the limitations of evaluation and management by telemedicine. The patient expressed understanding and agreed to proceed.   Allergies (verified) Penicillins   History: Past Medical History:  Diagnosis Date   ALLERGIC RHINITIS 04/08/2008   Allergy    Basal cell carcinoma    back   CAD (coronary artery disease) 2008   status post anterior myocardial infarction, treated w/ a dru-eluting stent to the LAD   CAD, NATIVE VESSEL 11/10/2009   GERD (gastroesophageal reflux disease)    Hiatal hernia    HYPERLIPIDEMIA 03/29/2008   HYPERTENSION 03/29/2008   Myocardial infarction [facility])    2008   Osteopenia    Past Surgical History:  Procedure Laterality Date   ANTERIOR AND POSTERIOR REPAIR WITH SACROSPINOUS FIXATION N/A 07/20/2018   Procedure: ANTERIOR AND POSTERIOR REPAIR WITH SACROSPINOUS FIXATION;  Surgeon: Leva Rush, MD;  Location: [facility];  Service: Gynecology;  Laterality: N/A;   APPENDECTOMY     BASAL CELL CARCINOMA EXCISION     back   BLADDER SUSPENSION N/A 07/20/2018   Procedure: TRANSOBTURATOR VALORIE INFANTE;  Surgeon: Leva Rush, MD;  Location: [facility];  Service: Gynecology;  Laterality: N/A;   CATARACT EXTRACTION Bilateral    LAPAROSCOPIC ASSISTED VAGINAL HYSTERECTOMY Bilateral    LAPAROSCOPIC VAGINAL HYSTERECTOMY WITH SALPINGO OOPHORECTOMY Bilateral 07/20/2018   Procedure: LAPAROSCOPIC ASSISTED VAGINAL HYSTERECTOMY WITH SALPINGO OOPHORECTOMY;  Surgeon: Leva Rush, MD;  Location: [facility];  Service: Gynecology;  Laterality: Bilateral;   Percutaneous transluminal coronary angioplasty and drug-eluting  09/2007   Stent placement in the left  anterior descending   WISDOM TOOTH EXTRACTION     Family History  Problem Relation Age of Onset   Coronary artery disease Mother    Lung cancer Father    Heart disease Brother        stent placed   Coronary artery disease Maternal Uncle    CAD Son        died 42 of MI   Colon cancer Neg Hx    Esophageal cancer Neg Hx    Rectal cancer Neg Hx    Stomach cancer Neg Hx    Social History   Occupational History   Occupation: retired  Tobacco Use   Smoking status: Never   Smokeless tobacco: Never  Vaping Use   Vaping status: Never Used  Substance and Sexual Activity   Alcohol use: No   Drug use: No   Sexual activity: Not on file   Tobacco Counseling Counseling given: Not Answered  SDOH Screenings   Food Insecurity: No Food Insecurity (10/27/2024)  Housing: Unknown (10/27/2024)  Transportation Needs: No Transportation Needs (10/27/2024)  Utilities: Not At Risk (10/27/2024)  Depression (PHQ2-9): Low Risk  (10/27/2024)  Financial Resource Strain: Low Risk  (05/27/2023)  Physical Activity: Insufficiently Active (10/27/2024)  Social Connections: Moderately Integrated (10/27/2024)  Stress: No Stress Concern Present (10/27/2024)  Tobacco Use: Low Risk  (10/27/2024)  Health Literacy: Adequate Health Literacy (10/27/2024)   Depression Screen    10/27/2024    9:35 AM 01/09/2024    2:06 PM 07/07/2023   12:43 PM 05/27/2023    8:05 AM 12/26/2022    1:18 PM 12/13/2021  1:45 PM 07/13/2021    8:49 AM  PHQ 2/9 Scores  PHQ - 2 Score 0 0 0 0 0 0 0  PHQ- 9 Score  0 2  0       Goals Addressed               This Visit's Progress     get back to walking (pt-stated)        Get back to walking        Visit info / Clinical Intake: Medicare Wellness Visit Type:: Subsequent Annual Wellness Visit Medicare Wellness Visit Mode:: Telephone If telephone:: video declined If telephone or video:: pt reported vitals Interpreter Needed?: No Pre-visit prep was completed: yes AWV questionnaire  completed by patient prior to visit?: no Living arrangements:: lives with spouse/significant other Patient's Overall Health Status Rating: good Typical amount of pain: none Does pain affect daily life?: no Are you currently prescribed opioids?: no  Dietary Habits and Nutritional Risks How many meals a day?: 2 Eats fruit and vegetables daily?: yes Most meals are obtained by: preparing own meals; eating out Diabetic:: no  Functional Status Activities of Daily Living (to include ambulation/medication): Independent Ambulation: Independent with device- listed below Home Assistive Devices/Equipment: Eyeglasses Medication Administration: Independent Home Management: Independent Manage your own finances?: yes Primary transportation is: driving Concerns about vision?: no *vision screening is required for [facility]* (wears glasses) Concerns about hearing?: (!) yes (HOH) Uses hearing aids?: no Hear whispered voice?: yes  Fall Screening Falls in the past year?: 0 Number of falls in past year: 0 Was there an injury with Fall?: 0 Fall Risk Category Calculator: 0 Patient Fall Risk Level: Low Fall Risk  Fall Risk Patient at Risk for Falls Due to: No Fall Risks Fall risk Follow up: Falls prevention discussed  Home and Transportation Safety: All rugs have non-skid backing?: yes All stairs or steps have railings?: yes Grab bars in the bathtub or shower?: (!) no Have non-skid surface in bathtub or shower?: yes Good home lighting?: yes Regular seat belt use?: yes Hospital stays in the last year:: no  Cognitive Assessment Difficulty concentrating, remembering, or making decisions? : no Will 6CIT or Mini Cog be Completed: no 6CIT or Mini Cog Declined: patient alert, oriented, able to answer questions appropriately and recall recent events  Advance Directives (For Healthcare) Does Patient Have a Medical Advance Directive?: No Would patient like information on creating a medical advance  directive?: No - Patient declined  Reviewed/Updated  Reviewed/Updated: All        Objective:    Today's Vitals   10/27/24 0928  BP: 123/65  Pulse: (!) 57  Weight: 141 lb (64 kg)  Height: 5' 5 (1.651 m)   Body mass index is 23.46 kg/m.  Current Medications (verified) Outpatient Encounter Medications as of 10/27/2024  Medication Sig   acetaminophen  (TYLENOL ) 500 MG tablet Take 1,000 mg by mouth 2 (two) times daily as needed for moderate pain or headache.   aspirin 81 MG tablet Take 81 mg by mouth every evening.   atorvastatin  (LIPITOR) 80 MG tablet TAKE 1 TABLET BY MOUTH ONCE DAILY AS DIRECTED   denosumab (PROLIA) 60 MG/ML SOSY injection inject 1 subcutaneous syringe every 6 months to be given in physicians office   diclofenac  sodium (VOLTAREN ) 1 % GEL Apply 2 g topically 3 (three) times daily as needed.   ezetimibe  (ZETIA ) 10 MG tablet Take 1 tablet by mouth once daily   losartan  (COZAAR ) 50 MG tablet Take 1 tablet (  50 mg total) by mouth daily.   nitroGLYCERIN  (NITROSTAT ) 0.4 MG SL tablet Place 1 tablet (0.4 mg total) under the tongue every 5 (five) minutes as needed for chest pain (call 911 if no relief after 2nd dose).   omeprazole  (PRILOSEC) 20 MG capsule Take 1 capsule (20 mg total) by mouth daily.   tiZANidine (ZANAFLEX) 2 MG tablet Take 1-2 tablets (2-4 mg total) by mouth every 8 (eight) hours as needed.   No facility-administered encounter medications on file as of 10/27/2024.   Hearing/Vision screen Hearing Screening - Comments:: Pt stated HOH  Vision Screening - Comments:: Wears rx glasses - up to date with routine eye exams with Dr Malvina  Immunizations and Health Maintenance Health Maintenance  Topic Date Due   COVID-19 Vaccine (8 - 2025-26 season) 08/23/2024   DTaP/Tdap/Td (3 - Td or Tdap) 12/02/2024   Medicare Annual Wellness (AWV)  10/27/2025   Pneumococcal Vaccine: 50+ Years  Completed   Influenza Vaccine  Completed   DEXA SCAN  Completed   Zoster Vaccines-  Shingrix  Completed   Meningococcal B Vaccine  Aged Out        Assessment/Plan:  This is a routine wellness examination for [place].  Patient Care Team: Katrinka Garnette KIDD, MD as PCP - General (Family Medicine) Wonda Sharper, MD as PCP - Cardiology (Cardiology) Leva Rush, MD as Consulting Physician (Obstetrics and Gynecology)  I have personally reviewed and noted the following in the patient's chart:   Medical and social history Use of alcohol, tobacco or illicit drugs  Current medications and supplements including opioid prescriptions. Functional ability and status Nutritional status Physical activity Advanced directives List of other physicians Hospitalizations, surgeries, and ER visits in previous 12 months Vitals Screenings to include cognitive, depression, and falls Referrals and appointments  No orders of the defined types were placed in this encounter.  In addition, I have reviewed and discussed with patient certain preventive protocols, quality metrics, and best practice recommendations. A written personalized care plan for preventive services as well as general preventive health recommendations were provided to patient.   Ellouise VEAR Haws, LPN   [id number]/03/[id number]   Return in 1 year (on 10/27/2025).  After Visit Summary: (MyChart) Due to this being a telephonic visit, the after visit summary with patients personalized plan was offered to patient via MyChart   Nurse Notes: nothing significant at this time

## 2024-10-31 ENCOUNTER — Other Ambulatory Visit: Payer: Self-pay | Admitting: Family Medicine

## 2024-11-01 ENCOUNTER — Ambulatory Visit: Payer: Self-pay | Admitting: Family Medicine

## 2024-11-01 DIAGNOSIS — G8929 Other chronic pain: Secondary | ICD-10-CM

## 2024-11-01 NOTE — Progress Notes (Signed)
 Lumbar spine x-ray shows severe arthritis

## 2024-11-01 NOTE — Telephone Encounter (Signed)
 Patient missed a phone call and was calling back.

## 2024-11-02 ENCOUNTER — Ambulatory Visit: Admitting: Physical Therapy

## 2024-11-02 ENCOUNTER — Other Ambulatory Visit: Payer: Self-pay

## 2024-11-02 ENCOUNTER — Encounter: Payer: Self-pay | Admitting: Physical Therapy

## 2024-11-02 DIAGNOSIS — M6281 Muscle weakness (generalized): Secondary | ICD-10-CM | POA: Diagnosis not present

## 2024-11-02 DIAGNOSIS — M25552 Pain in left hip: Secondary | ICD-10-CM

## 2024-11-02 NOTE — Patient Instructions (Signed)
 Access Code: [email or web address] URL: [email or web address] Date: 11/02/2024 Prepared by: Elaine Daring  Exercises - Clamshell  - 1 x daily - 2 sets - 10 reps - Bridge  - 1 x daily - 3 sets - 5 reps - Modified Thomas Stretch  - 1 x daily - 60 seconds hold

## 2024-11-02 NOTE — Therapy (Signed)
 OUTPATIENT PHYSICAL THERAPY EVALUATION   Patient Name: Jennifer Gross MRN: [id number] DOB:02/17/1941, 83 y.o., female Today's Date: 11/02/2024   END OF SESSION:  PT End of Session - 11/02/24 1616     Visit Number 1    Number of Visits 9    Date for Recertification  12/28/24    Authorization Type [employer] MCR    Progress Note Due on Visit 10    PT Start Time 1015    PT Stop Time 1100    PT Time Calculation (min) 45 min    Activity Tolerance Patient tolerated treatment well    Behavior During Therapy [facility] for tasks assessed/performed          Past Medical History:  Diagnosis Date   ALLERGIC RHINITIS 04/08/2008   Allergy    Basal cell carcinoma    back   CAD (coronary artery disease) 2008   status post anterior myocardial infarction, treated w/ a dru-eluting stent to the LAD   CAD, NATIVE VESSEL 11/10/2009   GERD (gastroesophageal reflux disease)    Hiatal hernia    HYPERLIPIDEMIA 03/29/2008   HYPERTENSION 03/29/2008   Myocardial infarction [facility])    2008   Osteopenia    Past Surgical History:  Procedure Laterality Date   ANTERIOR AND POSTERIOR REPAIR WITH SACROSPINOUS FIXATION N/A 07/20/2018   Procedure: ANTERIOR AND POSTERIOR REPAIR WITH SACROSPINOUS FIXATION;  Surgeon: Leva Rush, MD;  Location: [facility];  Service: Gynecology;  Laterality: N/A;   APPENDECTOMY     BASAL CELL CARCINOMA EXCISION     back   BLADDER SUSPENSION N/A 07/20/2018   Procedure: TRANSOBTURATOR VALORIE INFANTE;  Surgeon: Leva Rush, MD;  Location: [facility];  Service: Gynecology;  Laterality: N/A;   CATARACT EXTRACTION Bilateral    LAPAROSCOPIC ASSISTED VAGINAL HYSTERECTOMY Bilateral    LAPAROSCOPIC VAGINAL HYSTERECTOMY WITH SALPINGO OOPHORECTOMY Bilateral 07/20/2018   Procedure: LAPAROSCOPIC ASSISTED VAGINAL HYSTERECTOMY WITH SALPINGO OOPHORECTOMY;  Surgeon: Leva Rush, MD;  Location: [facility];  Service: Gynecology;  Laterality: Bilateral;   Percutaneous transluminal coronary angioplasty and  drug-eluting  09/2007   Stent placement in the left anterior descending   WISDOM TOOTH EXTRACTION     Patient Active Problem List   Diagnosis Date Noted   Hyperglycemia, unspecified 05/11/2020   Left hip pain 02/10/2019   Left shoulder pain 02/10/2019   S/P laparoscopic assisted vaginal hysterectomy (LAVH) 07/20/2018   Chronic pain of right knee 10/12/2017   History of basal cell carcinoma of skin    Osteoporosis    CAD (coronary artery disease) 11/10/2009   GERD 05/05/2008   Allergic rhinitis 04/08/2008   Hyperlipidemia 03/29/2008   Essential hypertension 03/29/2008    PCP: Katrinka Garnette KIDD, MD  REFERRING PROVIDER: Joane Artist RAMAN, MD  REFERRING DIAG: Left hip pain; Chronic bilateral low back pain with left-sided sciatica  Rationale for Evaluation and Treatment: Rehabilitation  THERAPY DIAG:  Pain in left hip  Muscle weakness (generalized)  ONSET DATE: Chronic (referral date 10/26/2024)   SUBJECTIVE:         SUBJECTIVE STATEMENT: Patient reports soreness of left hip, states it just hurts. She did have an MRI that showed a torn tendon on the left hip. The pain is on the outside of the left hip, and also on the front of the hip that travels down the inner thigh to the knee. The pain started years ago and has just gotten worse. She reports pain can be worse at night. She also has trouble getting up and moving  after she has been sitting, she has to hold on to something when she gets up. She also reports pain walking around her home and doing household chores. She does have some stairs at home and she has to step up one stair at a time and use the railing. She denies any numbness or tingling.   PERTINENT HISTORY:  See PMH above  PAIN:  Are you having pain? Yes:  NPRS scale: 8/10 Pain location: Left hip Pain description: inflamed, nagging, constant Aggravating factors: Getting up from a chair, walking Relieving factors: Tylenol   PRECAUTIONS: None  RED  FLAGS: None   WEIGHT BEARING RESTRICTIONS: No  FALLS:  Has patient fallen in last 6 months? No  PLOF: Independent  PATIENT GOALS: Pain relief   OBJECTIVE:  Note: Objective measures were completed at Evaluation unless otherwise noted. PATIENT SURVEYS:  PSFS: 2.33 Climbing stairs normally: 4 Getting out of the truck: 1 Getting out of a chair after watching TV: 2  COGNITION: Overall cognitive status: Within functional limits for tasks assessed     SENSATION: WFL  MUSCLE LENGTH: Patient demonstrates limitations in bilateral hamstring and left hip flexor/quad   POSTURE:   Rounded shoulder posture, reduced lumbar lordosis  PALPATION: Tender to palpation left lateral hip and greater trochanteric region  LUMBAR ROM:   AROM eval  Flexion 75%  Extension 75%  Right lateral flexion WFL Reports left lateral hip pain  Left lateral flexion WFL  Right rotation 75%  Left rotation 75%   (Blank rows = not tested)  LOWER EXTREMITY ROM:      Hip PROM grossly WFL  LOWER EXTREMITY MMT:    MMT Right eval Left eval  Hip flexion 4- 4-  Hip extension 4- 3  Hip abduction 4- 3+  Hip adduction    Hip internal rotation    Hip external rotation    Knee flexion 4 4  Knee extension 4 4  Ankle dorsiflexion    Ankle plantarflexion    Ankle inversion    Ankle eversion     (Blank rows = not tested)  LUMBAR SPECIAL TESTS:  Slump and SLR negative for reproduction of symptoms  FUNCTIONAL TESTS:  5 times sit to stand: 16 seconds  GAIT: Assistive device utilized: None Level of assistance: Complete Independence Comments: Trendelenburg on left   TREATMENT [facility] Adult PT Treatment:                                                DATE: 11/02/2024 Side clamshell Bridge Modified thomas stretch  Discussed glute med anatomy and goal to strengthening the hips and core to improve her mobility. Discussed incorporating a walking program, beginning with a few minutes as tolerated and  gradually increasing the time she walks as able to improve her activity tolerance. Discussed the MRI for lumbar spine and reasons for assessing lumbar spine for left thigh symptoms.  PATIENT EDUCATION:  Education details: Exam findings, POC, HEP Person educated: Patient Education method: Explanation, Demonstration, Tactile cues, Verbal cues, and Handouts Education comprehension: verbalized understanding, returned demonstration, verbal cues required, tactile cues required, and needs further education  HOME EXERCISE PROGRAM: Access Code: [email or web address]    ASSESSMENT: CLINICAL IMPRESSION: Patient is a 83 y.o. female who was seen today for physical therapy evaluation and treatment for chronic left hip thigh pain that seems consistent with gluteal tendinopathy with possible  lumbar radicular symptoms into the left groin and inner that to the knee. She demonstrates slight limitations with her lumbar mobility, gross strength deficits of the left hip, and flexibility deficits that are likely contributing to her symptoms and impacting her functional ability.   OBJECTIVE IMPAIRMENTS: Abnormal gait, decreased activity tolerance, decreased ROM, decreased strength, impaired flexibility, postural dysfunction, and pain.   ACTIVITY LIMITATIONS: carrying, lifting, sitting, standing, stairs, and locomotion level  PARTICIPATION LIMITATIONS: meal prep, cleaning, shopping, community activity, and yard work  PERSONAL FACTORS: Fitness, Past/current experiences, and Time since onset of injury/illness/exacerbation are also affecting patient's functional outcome.   REHAB POTENTIAL: Good  CLINICAL DECISION MAKING: Stable/uncomplicated  EVALUATION COMPLEXITY: Low   GOALS: Goals reviewed with patient? Yes  SHORT TERM GOALS: Target date: 11/30/2024  Patient will be I with initial HEP in order to progress with therapy. Baseline: HEP provided at eval Goal status: INITIAL  2.  Patient will report left hip and thigh  pain </= 5/10 in order to reduce functional limitations Baseline: 8/10 pain Goal status: INITIAL  LONG TERM GOALS: Target date: 12/28/2024  Patient will be I with final HEP to maintain progress from PT. Baseline: HEP provided at eval Goal status: INITIAL  2.  Patient will report PSFS >/= 6 in order to indicate improvement in their functional ability. Baseline: 2.33 Goal status: INITIAL  3.  Patient will demonstrate left hip strength >/= 4-/5 MMT in order to improve walking tolerance and ability to get out of her truck without limitation Baseline: see strength deficits above Goal status: INITIAL  4.  Patient will be able to negotiate stairs reciprocally in order to improve her household mobility Baseline: patient reports unable to go up/down stairs reciprocally Goal status: INITIAL   PLAN: PT FREQUENCY: 1x/week  PT DURATION: 8 weeks  PLANNED INTERVENTIONS: 97164- PT Re-evaluation, 97750- Physical Performance Testing, 97110-Therapeutic exercises, 97530- Therapeutic activity, 97112- Neuromuscular re-education, 97535- Self Care, 02859- Manual therapy, [id number]- Gait training, [phone] (1-2 muscles), 20561 (3+ muscles)- Dry Needling, Patient/Family education, Balance training, Stair training, Joint mobilization, Joint manipulation, Spinal manipulation, Spinal mobilization, Cryotherapy, and Moist heat.  PLAN FOR NEXT SESSION: Review HEP and progress PRN, manual/mobs for lumbar and left hip as needed, address flexibility deficits, progress core and hip strengthening    Elaine Daring, PT, DPT, LAT, ATC 11/02/24  4:52 PM Phone: [phone] Fax: [phone]

## 2024-11-04 NOTE — Progress Notes (Signed)
 I connected with  Jennifer Gross on 11/04/24 by a audio enabled telemedicine application and verified that I am speaking with the correct person using two identifiers.  Patient Location: Home  Provider Location: Home Office  Persons Participating in Visit: Patient.  I discussed the limitations of evaluation and management by telemedicine. The patient expressed understanding and agreed to proceed.  Vital Signs: Because this visit was a virtual/telehealth visit, some criteria may be missing or patient reported. Any vitals not documented were not able to be obtained and vitals that have been documented are patient reported.

## 2024-11-08 ENCOUNTER — Encounter: Admitting: Physical Therapy

## 2024-11-11 ENCOUNTER — Other Ambulatory Visit: Payer: Self-pay

## 2024-11-11 ENCOUNTER — Encounter: Payer: Self-pay | Admitting: Physical Therapy

## 2024-11-11 ENCOUNTER — Ambulatory Visit: Admitting: Physical Therapy

## 2024-11-11 DIAGNOSIS — M6281 Muscle weakness (generalized): Secondary | ICD-10-CM

## 2024-11-11 DIAGNOSIS — M25552 Pain in left hip: Secondary | ICD-10-CM

## 2024-11-11 NOTE — Therapy (Signed)
 OUTPATIENT PHYSICAL THERAPY TREATMENT   Patient Name: Jennifer Gross MRN: [id number] DOB:[birth year], 83 y.o., female Today's Date: 11/12/2024   END OF SESSION:  PT End of Session - 11/11/24 1356     Visit Number 2    Number of Visits 9    Date for Recertification  12/28/24    Authorization Type [employer] MCR    Authorization Time Period 11/02/2024 - 01/31/2025    Authorization - Visit Number 2    Authorization - Number of Visits 8    Progress Note Due on Visit 10    PT Start Time 1348    PT Stop Time 1428    PT Time Calculation (min) 40 min    Activity Tolerance Patient tolerated treatment well    Behavior During Therapy [facility] for tasks assessed/performed           Past Medical History:  Diagnosis Date   ALLERGIC RHINITIS 04/08/2008   Allergy    Basal cell carcinoma    back   CAD (coronary artery disease) 2008   status post anterior myocardial infarction, treated w/ a dru-eluting stent to the LAD   CAD, NATIVE VESSEL 11/10/2009   GERD (gastroesophageal reflux disease)    Hiatal hernia    HYPERLIPIDEMIA 03/29/2008   HYPERTENSION 03/29/2008   Myocardial infarction [facility])    2008   Osteopenia    Past Surgical History:  Procedure Laterality Date   ANTERIOR AND POSTERIOR REPAIR WITH SACROSPINOUS FIXATION N/A 07/20/2018   Procedure: ANTERIOR AND POSTERIOR REPAIR WITH SACROSPINOUS FIXATION;  Surgeon: Leva Rush, MD;  Location: [facility];  Service: Gynecology;  Laterality: N/A;   APPENDECTOMY     BASAL CELL CARCINOMA EXCISION     back   BLADDER SUSPENSION N/A 07/20/2018   Procedure: TRANSOBTURATOR VALORIE INFANTE;  Surgeon: Leva Rush, MD;  Location: [facility];  Service: Gynecology;  Laterality: N/A;   CATARACT EXTRACTION Bilateral    LAPAROSCOPIC ASSISTED VAGINAL HYSTERECTOMY Bilateral    LAPAROSCOPIC VAGINAL HYSTERECTOMY WITH SALPINGO OOPHORECTOMY Bilateral 07/20/2018   Procedure: LAPAROSCOPIC ASSISTED VAGINAL HYSTERECTOMY WITH SALPINGO OOPHORECTOMY;  Surgeon: Leva Rush, MD;   Location: [facility];  Service: Gynecology;  Laterality: Bilateral;   Percutaneous transluminal coronary angioplasty and drug-eluting  09/2007   Stent placement in the left anterior descending   WISDOM TOOTH EXTRACTION     Patient Active Problem List   Diagnosis Date Noted   Hyperglycemia, unspecified 05/11/2020   Left hip pain 02/10/2019   Left shoulder pain 02/10/2019   S/P laparoscopic assisted vaginal hysterectomy (LAVH) 07/20/2018   Chronic pain of right knee 10/12/2017   History of basal cell carcinoma of skin    Osteoporosis    CAD (coronary artery disease) 11/10/2009   GERD 05/05/2008   Allergic rhinitis 04/08/2008   Hyperlipidemia 03/29/2008   Essential hypertension 03/29/2008    PCP: Katrinka Garnette KIDD, MD  REFERRING PROVIDER: Joane Artist RAMAN, MD  REFERRING DIAG: Left hip pain; Chronic bilateral low back pain with left-sided sciatica  Rationale for Evaluation and Treatment: Rehabilitation  THERAPY DIAG:  Pain in left hip  Muscle weakness (generalized)  ONSET DATE: Chronic (referral date 10/26/2024)   SUBJECTIVE:         SUBJECTIVE STATEMENT: Patient reports the exercises have been helpful and she is feeling better.  Eval: Patient reports soreness of left hip, states it just hurts. She did have an MRI that showed a torn tendon on the left hip. The pain is on the outside of the left hip, and also  on the front of the hip that travels down the inner thigh to the knee. The pain started years ago and has just gotten worse. She reports pain can be worse at night. She also has trouble getting up and moving after she has been sitting, she has to hold on to something when she gets up. She also reports pain walking around her home and doing household chores. She does have some stairs at home and she has to step up one stair at a time and use the railing. She denies any numbness or tingling.   PERTINENT HISTORY:  See PMH above  PAIN:  Are you having pain? Yes:  NPRS scale:  2/10 Pain location: Left hip Pain description: inflamed, nagging, constant Aggravating factors: Getting up from a chair, walking Relieving factors: Tylenol   PRECAUTIONS: None  PATIENT GOALS: Pain relief   OBJECTIVE:  Note: Objective measures were completed at Evaluation unless otherwise noted. PATIENT SURVEYS:  PSFS: 2.33 Climbing stairs normally: 4 Getting out of the truck: 1 Getting out of a chair after watching TV: 2  MUSCLE LENGTH: Patient demonstrates limitations in bilateral hamstring and left hip flexor/quad   POSTURE:   Rounded shoulder posture, reduced lumbar lordosis  PALPATION: Tender to palpation left lateral hip and greater trochanteric region  LUMBAR ROM:   AROM eval  Flexion 75%  Extension 75%  Right lateral flexion WFL Reports left lateral hip pain  Left lateral flexion WFL  Right rotation 75%  Left rotation 75%   (Blank rows = not tested)  LOWER EXTREMITY ROM:      Hip PROM grossly WFL  LOWER EXTREMITY MMT:    MMT Right eval Left eval  Hip flexion 4- 4-  Hip extension 4- 3  Hip abduction 4- 3+  Hip adduction    Hip internal rotation    Hip external rotation    Knee flexion 4 4  Knee extension 4 4  Ankle dorsiflexion    Ankle plantarflexion    Ankle inversion    Ankle eversion     (Blank rows = not tested)  LUMBAR SPECIAL TESTS:  Slump and SLR negative for reproduction of symptoms  FUNCTIONAL TESTS:  5 times sit to stand: 16 seconds  GAIT: Assistive device utilized: None Level of assistance: Complete Independence Comments: Trendelenburg on left   TREATMENT [facility] Adult PT Treatment:                                                DATE: 11/11/2024 Recumbent bike L3 x 5 min (fwd/bwd) to improve endurance and workload capacity Bridge 2 x 10 SLR 2 x 10 each Side clamshell 2 x 10 each Sit to stand 2 x 10 Forward 4 step-up 2 x 10 each  PATIENT EDUCATION:  Education details: HEP update Person educated: Patient Education  method: Explanation, Demonstration, Tactile cues, Verbal cues, and Handouts Education comprehension: verbalized understanding, returned demonstration, verbal cues required, tactile cues required, and needs further education  HOME EXERCISE PROGRAM: Access Code: [email or web address]    ASSESSMENT: CLINICAL IMPRESSION: Patient tolerated therapy well with no adverse effects. Therapy focused on continued strengthening for the hips and LE with good tolerance. She does report improvement in her symptoms this visit. She was able to progress with some quad strengthening and more closed chain exercises of sit to stands and step-ups to work on LE strengthening. Updated  her HEP to progress strengthening for home. Patient would benefit from continued skilled PT to progress mobility and strength in order to reduce pain and maximize functional ability.   Eval: Patient is a 83 y.o. female who was seen today for physical therapy evaluation and treatment for chronic left hip thigh pain that seems consistent with gluteal tendinopathy with possible lumbar radicular symptoms into the left groin and inner that to the knee. She demonstrates slight limitations with her lumbar mobility, gross strength deficits of the left hip, and flexibility deficits that are likely contributing to her symptoms and impacting her functional ability.   OBJECTIVE IMPAIRMENTS: Abnormal gait, decreased activity tolerance, decreased ROM, decreased strength, impaired flexibility, postural dysfunction, and pain.   ACTIVITY LIMITATIONS: carrying, lifting, sitting, standing, stairs, and locomotion level  PARTICIPATION LIMITATIONS: meal prep, cleaning, shopping, community activity, and yard work  PERSONAL FACTORS: Fitness, Past/current experiences, and Time since onset of injury/illness/exacerbation are also affecting patient's functional outcome.    GOALS: Goals reviewed with patient? Yes  SHORT TERM GOALS: Target date: 11/30/2024  Patient will be I  with initial HEP in order to progress with therapy. Baseline: HEP provided at eval Goal status: INITIAL  2.  Patient will report left hip and thigh pain </= 5/10 in order to reduce functional limitations Baseline: 8/10 pain Goal status: INITIAL  LONG TERM GOALS: Target date: 12/28/2024  Patient will be I with final HEP to maintain progress from PT. Baseline: HEP provided at eval Goal status: INITIAL  2.  Patient will report PSFS >/= 6 in order to indicate improvement in their functional ability. Baseline: 2.33 Goal status: INITIAL  3.  Patient will demonstrate left hip strength >/= 4-/5 MMT in order to improve walking tolerance and ability to get out of her truck without limitation Baseline: see strength deficits above Goal status: INITIAL  4.  Patient will be able to negotiate stairs reciprocally in order to improve her household mobility Baseline: patient reports unable to go up/down stairs reciprocally Goal status: INITIAL   PLAN: PT FREQUENCY: 1x/week  PT DURATION: 8 weeks  PLANNED INTERVENTIONS: 97164- PT Re-evaluation, 97750- Physical Performance Testing, 97110-Therapeutic exercises, 97530- Therapeutic activity, 97112- Neuromuscular re-education, 97535- Self Care, 02859- Manual therapy, [id number]- Gait training, [phone] (1-2 muscles), 20561 (3+ muscles)- Dry Needling, Patient/Family education, Balance training, Stair training, Joint mobilization, Joint manipulation, Spinal manipulation, Spinal mobilization, Cryotherapy, and Moist heat.  PLAN FOR NEXT SESSION: Review HEP and progress PRN, manual/mobs for lumbar and left hip as needed, address flexibility deficits, progress core and hip strengthening    Elaine Daring, PT, DPT, LAT, ATC 11/12/24  9:27 AM Phone: [phone] Fax: [phone]

## 2024-11-11 NOTE — Patient Instructions (Signed)
 Access Code: [email or web address] URL: [email or web address] Date: 11/11/2024 Prepared by: Elaine Daring  Exercises - Clamshell  - 1 x daily - 2 sets - 10 reps - Bridge  - 1 x daily - 3 sets - 5 reps - Active Straight Leg Raise with Quad Set  - 1 x daily - 2 sets - 10 reps - Modified Thomas Stretch  - 1 x daily - 60 seconds hold

## 2024-11-22 ENCOUNTER — Ambulatory Visit
Admission: RE | Admit: 2024-11-22 | Discharge: 2024-11-22 | Disposition: A | Source: Ambulatory Visit | Attending: Family Medicine | Admitting: Family Medicine

## 2024-11-22 DIAGNOSIS — G8929 Other chronic pain: Secondary | ICD-10-CM

## 2024-11-22 DIAGNOSIS — M48061 Spinal stenosis, lumbar region without neurogenic claudication: Secondary | ICD-10-CM | POA: Diagnosis not present

## 2024-11-23 ENCOUNTER — Ambulatory Visit: Admitting: Physical Therapy

## 2024-11-23 ENCOUNTER — Encounter: Payer: Self-pay | Admitting: Physical Therapy

## 2024-11-23 ENCOUNTER — Other Ambulatory Visit: Payer: Self-pay

## 2024-11-23 DIAGNOSIS — M25552 Pain in left hip: Secondary | ICD-10-CM | POA: Diagnosis not present

## 2024-11-23 DIAGNOSIS — M6281 Muscle weakness (generalized): Secondary | ICD-10-CM | POA: Diagnosis not present

## 2024-11-23 NOTE — Patient Instructions (Signed)
 Access Code: [email or web address] URL: [email or web address] Date: 11/23/2024 Prepared by: Elaine Daring  Exercises - Clamshell  - 1 x daily - 2 sets - 10 reps - Bridge  - 1 x daily - 3 sets - 5 reps - Active Straight Leg Raise with Quad Set  - 1 x daily - 2 sets - 10 reps - Modified Thomas Stretch  - 1 x daily - 60 seconds hold - Standing Tandem Balance with Counter Support  - 1 x daily - 3 reps - 30 seconds hold

## 2024-11-23 NOTE — Therapy (Signed)
 OUTPATIENT PHYSICAL THERAPY TREATMENT   Patient Name: Jennifer Gross MRN: [id number] DOB:July 19, 1941, 83 y.o., female Today's Date: 11/23/2024   END OF SESSION:  PT End of Session - 11/23/24 1256     Visit Number 3    Number of Visits 9    Date for Recertification  12/28/24    Authorization Type [employer] MCR    Authorization Time Period 11/02/2024 - 01/31/2025    Authorization - Visit Number 3    Authorization - Number of Visits 8    Progress Note Due on Visit 10    PT Start Time 1300    PT Stop Time 1341    PT Time Calculation (min) 41 min    Activity Tolerance Patient tolerated treatment well    Behavior During Therapy [facility] for tasks assessed/performed            Past Medical History:  Diagnosis Date   ALLERGIC RHINITIS 04/08/2008   Allergy    Basal cell carcinoma    back   CAD (coronary artery disease) 2008   status post anterior myocardial infarction, treated w/ a dru-eluting stent to the LAD   CAD, NATIVE VESSEL 11/10/2009   GERD (gastroesophageal reflux disease)    Hiatal hernia    HYPERLIPIDEMIA 03/29/2008   HYPERTENSION 03/29/2008   Myocardial infarction [facility])    2008   Osteopenia    Past Surgical History:  Procedure Laterality Date   ANTERIOR AND POSTERIOR REPAIR WITH SACROSPINOUS FIXATION N/A 07/20/2018   Procedure: ANTERIOR AND POSTERIOR REPAIR WITH SACROSPINOUS FIXATION;  Surgeon: Leva Rush, MD;  Location: [facility];  Service: Gynecology;  Laterality: N/A;   APPENDECTOMY     BASAL CELL CARCINOMA EXCISION     back   BLADDER SUSPENSION N/A 07/20/2018   Procedure: TRANSOBTURATOR VALORIE INFANTE;  Surgeon: Leva Rush, MD;  Location: [facility];  Service: Gynecology;  Laterality: N/A;   CATARACT EXTRACTION Bilateral    LAPAROSCOPIC ASSISTED VAGINAL HYSTERECTOMY Bilateral    LAPAROSCOPIC VAGINAL HYSTERECTOMY WITH SALPINGO OOPHORECTOMY Bilateral 07/20/2018   Procedure: LAPAROSCOPIC ASSISTED VAGINAL HYSTERECTOMY WITH SALPINGO OOPHORECTOMY;  Surgeon: Leva Rush, MD;   Location: [facility];  Service: Gynecology;  Laterality: Bilateral;   Percutaneous transluminal coronary angioplasty and drug-eluting  09/2007   Stent placement in the left anterior descending   WISDOM TOOTH EXTRACTION     Patient Active Problem List   Diagnosis Date Noted   Hyperglycemia, unspecified 05/11/2020   Left hip pain 02/10/2019   Left shoulder pain 02/10/2019   S/P laparoscopic assisted vaginal hysterectomy (LAVH) 07/20/2018   Chronic pain of right knee 10/12/2017   History of basal cell carcinoma of skin    Osteoporosis    CAD (coronary artery disease) 11/10/2009   GERD 05/05/2008   Allergic rhinitis 04/08/2008   Hyperlipidemia 03/29/2008   Essential hypertension 03/29/2008    PCP: Katrinka Garnette KIDD, MD  REFERRING PROVIDER: Joane Artist RAMAN, MD  REFERRING DIAG: Left hip pain; Chronic bilateral low back pain with left-sided sciatica  Rationale for Evaluation and Treatment: Rehabilitation  THERAPY DIAG:  Pain in left hip  Muscle weakness (generalized)  ONSET DATE: Chronic (referral date 10/26/2024)   SUBJECTIVE:         SUBJECTIVE STATEMENT: Patient reports she can tell a big difference in making her hip feel better. She states that getting in and out of the truck is easier. She has a little soreness lying on the left side.  Eval: Patient reports soreness of left hip, states it just hurts. She  did have an MRI that showed a torn tendon on the left hip. The pain is on the outside of the left hip, and also on the front of the hip that travels down the inner thigh to the knee. The pain started years ago and has just gotten worse. She reports pain can be worse at night. She also has trouble getting up and moving after she has been sitting, she has to hold on to something when she gets up. She also reports pain walking around her home and doing household chores. She does have some stairs at home and she has to step up one stair at a time and use the railing. She denies any  numbness or tingling.   PERTINENT HISTORY:  See PMH above  PAIN:  Are you having pain? Yes:  NPRS scale: 0/10 Pain location: Left hip Pain description: inflamed, nagging, constant Aggravating factors: Getting up from a chair, walking Relieving factors: Tylenol   PRECAUTIONS: None  PATIENT GOALS: Pain relief   OBJECTIVE:  Note: Objective measures were completed at Evaluation unless otherwise noted. PATIENT SURVEYS:  PSFS: 2.33 Climbing stairs normally: 4 Getting out of the truck: 1 Getting out of a chair after watching TV: 2  MUSCLE LENGTH: Patient demonstrates limitations in bilateral hamstring and left hip flexor/quad   POSTURE:   Rounded shoulder posture, reduced lumbar lordosis  PALPATION: Tender to palpation left lateral hip and greater trochanteric region  LUMBAR ROM:   AROM eval  Flexion 75%  Extension 75%  Right lateral flexion WFL Reports left lateral hip pain  Left lateral flexion WFL  Right rotation 75%  Left rotation 75%   (Blank rows = not tested)  LOWER EXTREMITY ROM:      Hip PROM grossly WFL  LOWER EXTREMITY MMT:    MMT Right eval Left eval  Hip flexion 4- 4-  Hip extension 4- 3  Hip abduction 4- 3+  Hip adduction    Hip internal rotation    Hip external rotation    Knee flexion 4 4  Knee extension 4 4  Ankle dorsiflexion    Ankle plantarflexion    Ankle inversion    Ankle eversion     (Blank rows = not tested)  LUMBAR SPECIAL TESTS:  Slump and SLR negative for reproduction of symptoms  FUNCTIONAL TESTS:  5 times sit to stand: 16 seconds  GAIT: Assistive device utilized: None Level of assistance: Complete Independence Comments: Trendelenburg on left   TREATMENT [facility] Adult PT Treatment:                                                DATE: 11/23/2024 Recumbent bike L3 x 8 min (fwd/bwd) to improve endurance and workload capacity Bridge with adductor ball squeeze 2 x 10 Side clamshell with yellow 2 x 10 each Sit to stand  2 x 10 Tandem stance 3 x 30 sec each Forward 6 step-up 2 x 10 each  PATIENT EDUCATION:  Education details: HEP update Person educated: Patient Education method: Explanation, Demonstration, Tactile cues, Verbal cues, and Handouts Education comprehension: verbalized understanding, returned demonstration, verbal cues required, tactile cues required, and needs further education  HOME EXERCISE PROGRAM: Access Code: [email or web address]    ASSESSMENT: CLINICAL IMPRESSION: Patient tolerated therapy well with no adverse effects. Therapy continues focus on progressing her hip and LE strengthening, and incorporated balance training this  visit. She was able to progress with banded resistance for hip strengthening and step height with her step ups. She did report greater difficulty with left leg back in tandem stance. Overall she reports continued improvement. Updated her HEP to include tandem stance for balance training at home. Patient would benefit from continued skilled PT to progress mobility and strength in order to reduce pain and maximize functional ability.   Eval: Patient is a 83 y.o. female who was seen today for physical therapy evaluation and treatment for chronic left hip thigh pain that seems consistent with gluteal tendinopathy with possible lumbar radicular symptoms into the left groin and inner that to the knee. She demonstrates slight limitations with her lumbar mobility, gross strength deficits of the left hip, and flexibility deficits that are likely contributing to her symptoms and impacting her functional ability.   OBJECTIVE IMPAIRMENTS: Abnormal gait, decreased activity tolerance, decreased ROM, decreased strength, impaired flexibility, postural dysfunction, and pain.   ACTIVITY LIMITATIONS: carrying, lifting, sitting, standing, stairs, and locomotion level  PARTICIPATION LIMITATIONS: meal prep, cleaning, shopping, community activity, and yard work  PERSONAL FACTORS: Fitness,  Past/current experiences, and Time since onset of injury/illness/exacerbation are also affecting patient's functional outcome.    GOALS: Goals reviewed with patient? Yes  SHORT TERM GOALS: Target date: 11/30/2024  Patient will be I with initial HEP in order to progress with therapy. Baseline: HEP provided at eval Goal status: INITIAL  2.  Patient will report left hip and thigh pain </= 5/10 in order to reduce functional limitations Baseline: 8/10 pain Goal status: INITIAL  LONG TERM GOALS: Target date: 12/28/2024  Patient will be I with final HEP to maintain progress from PT. Baseline: HEP provided at eval Goal status: INITIAL  2.  Patient will report PSFS >/= 6 in order to indicate improvement in their functional ability. Baseline: 2.33 Goal status: INITIAL  3.  Patient will demonstrate left hip strength >/= 4-/5 MMT in order to improve walking tolerance and ability to get out of her truck without limitation Baseline: see strength deficits above Goal status: INITIAL  4.  Patient will be able to negotiate stairs reciprocally in order to improve her household mobility Baseline: patient reports unable to go up/down stairs reciprocally Goal status: INITIAL   PLAN: PT FREQUENCY: 1x/week  PT DURATION: 8 weeks  PLANNED INTERVENTIONS: 97164- PT Re-evaluation, 97750- Physical Performance Testing, 97110-Therapeutic exercises, 97530- Therapeutic activity, 97112- Neuromuscular re-education, 97535- Self Care, 02859- Manual therapy, [id number]- Gait training, [phone] (1-2 muscles), 20561 (3+ muscles)- Dry Needling, Patient/Family education, Balance training, Stair training, Joint mobilization, Joint manipulation, Spinal manipulation, Spinal mobilization, Cryotherapy, and Moist heat.  PLAN FOR NEXT SESSION: Review HEP and progress PRN, manual/mobs for lumbar and left hip as needed, address flexibility deficits, progress core and hip strengthening    Elaine Daring, PT, DPT, LAT, ATC 11/23/24   1:46 PM Phone: [phone] Fax: [phone]

## 2024-11-29 NOTE — Progress Notes (Signed)
 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  This fits pretty well as the source of the hip and leg pain.  Next step could be an epidural steroid injection which is an injection in the back.  I have ordered an epidural steroid injection.  You should hear soon about scheduling it.  If you would like to have a office visit first before the injection okay to schedule.

## 2024-11-30 ENCOUNTER — Encounter: Payer: Self-pay | Admitting: Physical Therapy

## 2024-11-30 ENCOUNTER — Ambulatory Visit: Admitting: Family Medicine

## 2024-11-30 ENCOUNTER — Encounter: Payer: Self-pay | Admitting: Family Medicine

## 2024-11-30 ENCOUNTER — Other Ambulatory Visit: Payer: Self-pay

## 2024-11-30 ENCOUNTER — Ambulatory Visit: Admitting: Physical Therapy

## 2024-11-30 VITALS — BP 142/86 | HR 67 | Ht 65.0 in | Wt 144.0 lb

## 2024-11-30 DIAGNOSIS — G8929 Other chronic pain: Secondary | ICD-10-CM | POA: Diagnosis not present

## 2024-11-30 DIAGNOSIS — M5442 Lumbago with sciatica, left side: Secondary | ICD-10-CM | POA: Diagnosis not present

## 2024-11-30 DIAGNOSIS — M25552 Pain in left hip: Secondary | ICD-10-CM | POA: Diagnosis not present

## 2024-11-30 DIAGNOSIS — M6281 Muscle weakness (generalized): Secondary | ICD-10-CM | POA: Diagnosis not present

## 2024-11-30 NOTE — Progress Notes (Signed)
 I, Leotis Batter, CMA acting as a scribe for Artist Lloyd, MD.  Jennifer Gross is a 83 y.o. female who presents to [facility] at [facility] today for f/u L hip and LBP w/ MRI review. Pt was last seen by Dr. Lloyd on 10/26/24 and was prescribed tizanidine  and l-spine MRI was ordered. Also referred to PT, completing 4 visits.  Today, pt reports continued lower back and left hip pain. Short-term relief with Tylenol . Pain will occasionally radiate into the upper left leg. Soreness at lateral aspect of the hip has improved with PT.   Dx testing: 11/22/24 L-spine MRI 10/26/24 L-spine XR  10/14/24 L hip MRI 04/20/24 L hip XR 05/08/15 DEXA scan  Pertinent review of systems: No fevers or chills  Relevant historical information: Hypertension   Exam:  BP (!) 142/86   Pulse 67   Ht 5' 5 (1.651 m)   Wt 144 lb (65.3 kg)   SpO2 97%   BMI 23.96 kg/m  General: Well Developed, well nourished, and in no acute distress.   MSK: L-spine nontender to palpation midline normal lumbar motion    Lab and Radiology Results  EXAM: MRI LUMBAR SPINE 11/22/2024 05:49:16 PM   TECHNIQUE: Multiplanar multisequence MRI of the lumbar spine was performed without the administration of intravenous contrast.   COMPARISON: Lumbar radiographs 10/26/2024.   CLINICAL HISTORY: 83 year old female with low back pain, symptoms persisting for over 6 weeks despite treatment.   FINDINGS: Normal lumbar segmentation on the comparison.   BONES AND ALIGNMENT: Stable and relatively normal lumbar lordosis. Subtle dextroconvex lumbar scoliosis. No significant spondylolisthesis. Normal vertebral body heights. Chronic degenerative endplate irregularity and marrow signal changes at L5-S1. For other visualized lumbar levels, the background marrow signal is normal. Intact visible sacrum. No marrow edema.   SPINAL CORD: Normal conus medullaris at T12-L1.   SOFT TISSUES: No paraspinal mass. Small  but circumscribed T2 hyperintense area in the visible right hepatic lobe of the liver on series 305 image 2 is most compatible with benign cyst or hemangioma (no follow up imaging recommended).   DEGENERATIVE:   Visible lower thoracic levels through L3-L4 are normal for age.   L4-L5: Disc desiccation with mild disc bulging, mild broad based central component of disc protrusion (series 305 image 29). Moderate facet and ligament flavum hypertrophy. No spinal stenosis. Mild lateral recess stenosis (descending L5 nerve levels). No foraminal stenosis.   L5-S1: Advanced chronic disc space loss, disc degeneration. Circumferential disc osteophyte complex, although mostly far lateral and greater on the right. Mild facet and ligament flavum hypertrophy. No spinal or lateral recess stenosis. Moderate bilateral L5 neural foraminal stenosis (on the right series 302 image 4).     IMPRESSION: 1. Mild for age lumbar spine degeneration overall.  No spinal stenosis. 2. But L5-S1 advanced chronic disc and endplate degeneration with moderate bilateral L5 neural foraminal stenosis. 3. L4-L5 small broad-based central disc protrusion with mild lateral recess stenosis.   Electronically signed by: Helayne Hurst MD 11/27/2024 10:23 AM EST RP Workstation: [id number] LILLETTE Artist Lloyd, personally (independently) visualized and performed the interpretation of the images attached in this note.      Assessment and Plan: 83 y.o. 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  This corresponds pretty well to her lumbar spine MRI done earlier this month.  Epidural steroid injection already ordered and should be scheduled in the near future.  We talked about MRI results and treatment plan.   PDMP not reviewed  this encounter. No orders of the defined types were placed in this encounter.  No orders of the defined types were placed in this encounter.    Discussed warning signs or symptoms.  Please see discharge instructions. Patient expresses understanding.   The above documentation has been reviewed and is accurate and complete Artist Lloyd, M.D.

## 2024-11-30 NOTE — Therapy (Signed)
 OUTPATIENT PHYSICAL THERAPY TREATMENT   Patient Name: Jennifer Gross MRN: [id number] DOB:October 08, 1941, 83 y.o., female Today's Date: 11/30/2024   END OF SESSION:  PT End of Session - 11/30/24 1109     Visit Number 4    Number of Visits 9    Date for Recertification  12/28/24    Authorization Type [employer] MCR    Authorization Time Period 11/02/2024 - 01/31/2025    Authorization - Visit Number 4    Authorization - Number of Visits 8    Progress Note Due on Visit 10    PT Start Time 1104    PT Stop Time 1135    PT Time Calculation (min) 31 min    Activity Tolerance Patient tolerated treatment well    Behavior During Therapy [facility] for tasks assessed/performed             Past Medical History:  Diagnosis Date   ALLERGIC RHINITIS 04/08/2008   Allergy    Basal cell carcinoma    back   CAD (coronary artery disease) 2008   status post anterior myocardial infarction, treated w/ a dru-eluting stent to the LAD   CAD, NATIVE VESSEL 11/10/2009   GERD (gastroesophageal reflux disease)    Hiatal hernia    HYPERLIPIDEMIA 03/29/2008   HYPERTENSION 03/29/2008   Myocardial infarction [facility])    2008   Osteopenia    Past Surgical History:  Procedure Laterality Date   ANTERIOR AND POSTERIOR REPAIR WITH SACROSPINOUS FIXATION N/A 07/20/2018   Procedure: ANTERIOR AND POSTERIOR REPAIR WITH SACROSPINOUS FIXATION;  Surgeon: Leva Rush, MD;  Location: [facility];  Service: Gynecology;  Laterality: N/A;   APPENDECTOMY     BASAL CELL CARCINOMA EXCISION     back   BLADDER SUSPENSION N/A 07/20/2018   Procedure: TRANSOBTURATOR VALORIE INFANTE;  Surgeon: Leva Rush, MD;  Location: [facility];  Service: Gynecology;  Laterality: N/A;   CATARACT EXTRACTION Bilateral    LAPAROSCOPIC ASSISTED VAGINAL HYSTERECTOMY Bilateral    LAPAROSCOPIC VAGINAL HYSTERECTOMY WITH SALPINGO OOPHORECTOMY Bilateral 07/20/2018   Procedure: LAPAROSCOPIC ASSISTED VAGINAL HYSTERECTOMY WITH SALPINGO OOPHORECTOMY;  Surgeon: Leva Rush, MD;   Location: [facility];  Service: Gynecology;  Laterality: Bilateral;   Percutaneous transluminal coronary angioplasty and drug-eluting  09/2007   Stent placement in the left anterior descending   WISDOM TOOTH EXTRACTION     Patient Active Problem List   Diagnosis Date Noted   Hyperglycemia, unspecified 05/11/2020   Left hip pain 02/10/2019   Left shoulder pain 02/10/2019   S/P laparoscopic assisted vaginal hysterectomy (LAVH) 07/20/2018   Chronic pain of right knee 10/12/2017   History of basal cell carcinoma of skin    Osteoporosis    CAD (coronary artery disease) 11/10/2009   GERD 05/05/2008   Allergic rhinitis 04/08/2008   Hyperlipidemia 03/29/2008   Essential hypertension 03/29/2008    PCP: Katrinka Garnette KIDD, MD  REFERRING PROVIDER: Joane Artist RAMAN, MD  REFERRING DIAG: Left hip pain; Chronic bilateral low back pain with left-sided sciatica  Rationale for Evaluation and Treatment: Rehabilitation  THERAPY DIAG:  Pain in left hip  Muscle weakness (generalized)  ONSET DATE: Chronic (referral date 10/26/2024)   SUBJECTIVE:         SUBJECTIVE STATEMENT: Patient reports overall she is still feeling better but she woke up this morning with pain along the left hip.   Eval: Patient reports soreness of left hip, states it just hurts. She did have an MRI that showed a torn tendon on the left hip. The  pain is on the outside of the left hip, and also on the front of the hip that travels down the inner thigh to the knee. The pain started years ago and has just gotten worse. She reports pain can be worse at night. She also has trouble getting up and moving after she has been sitting, she has to hold on to something when she gets up. She also reports pain walking around her home and doing household chores. She does have some stairs at home and she has to step up one stair at a time and use the railing. She denies any numbness or tingling.   PERTINENT HISTORY:  See PMH above  PAIN:  Are you  having pain? Yes:  NPRS scale: 3/10 Pain location: Left hip Pain description: inflamed, nagging, constant Aggravating factors: Getting up from a chair, walking Relieving factors: Tylenol   PRECAUTIONS: None  PATIENT GOALS: Pain relief   OBJECTIVE:  Note: Objective measures were completed at Evaluation unless otherwise noted. PATIENT SURVEYS:  PSFS: 2.33 Climbing stairs normally: 4 Getting out of the truck: 1 Getting out of a chair after watching TV: 2  MUSCLE LENGTH: Patient demonstrates limitations in bilateral hamstring and left hip flexor/quad   POSTURE:   Rounded shoulder posture, reduced lumbar lordosis  PALPATION: Tender to palpation left lateral hip and greater trochanteric region  LUMBAR ROM:   AROM eval  Flexion 75%  Extension 75%  Right lateral flexion WFL Reports left lateral hip pain  Left lateral flexion WFL  Right rotation 75%  Left rotation 75%   (Blank rows = not tested)  LOWER EXTREMITY ROM:      Hip PROM grossly WFL  LOWER EXTREMITY MMT:    MMT Right eval Left eval  Hip flexion 4- 4-  Hip extension 4- 3  Hip abduction 4- 3+  Hip adduction    Hip internal rotation    Hip external rotation    Knee flexion 4 4  Knee extension 4 4  Ankle dorsiflexion    Ankle plantarflexion    Ankle inversion    Ankle eversion     (Blank rows = not tested)  LUMBAR SPECIAL TESTS:  Slump and SLR negative for reproduction of symptoms  FUNCTIONAL TESTS:  5 times sit to stand: 16 seconds  GAIT: Assistive device utilized: None Level of assistance: Complete Independence Comments: Trendelenburg on left   TREATMENT [facility] Adult PT Treatment:                                                DATE: 11/30/2024 Recumbent bike L3 x 5 min (fwd/bwd) to improve endurance and workload capacity SLR 2 x 10 Bridge with yellow at knees 2 x 10 Side clamshell with yellow 3 x 10 left Sit to stand 3 x 10 Tandem stance 3 x 30 sec each  PATIENT EDUCATION:  Education  details: HEP Person educated: Patient Education method: Programmer, Multimedia, Demonstration, Actor cues, Verbal cues Education comprehension: verbalized understanding, returned demonstration, verbal cues required, tactile cues required, and needs further education  HOME EXERCISE PROGRAM: Access Code: [email or web address]    ASSESSMENT: CLINICAL IMPRESSION: Patient tolerated therapy well with no adverse effects. Therapy focused on continued strengthening for the hips and LE, and balance training with good tolerance. Se did arrive reporting increase in left hip pain but was able to complete all prescribed exercises and did  report improvement in her symptoms following therapy. She does report challenge with the tandem balance exercise. No changes made to her HEP this visit. Patient would benefit from continued skilled PT to progress mobility and strength in order to reduce pain and maximize functional ability.   Eval: Patient is a 83 y.o. female who was seen today for physical therapy evaluation and treatment for chronic left hip thigh pain that seems consistent with gluteal tendinopathy with possible lumbar radicular symptoms into the left groin and inner that to the knee. She demonstrates slight limitations with her lumbar mobility, gross strength deficits of the left hip, and flexibility deficits that are likely contributing to her symptoms and impacting her functional ability.   OBJECTIVE IMPAIRMENTS: Abnormal gait, decreased activity tolerance, decreased ROM, decreased strength, impaired flexibility, postural dysfunction, and pain.   ACTIVITY LIMITATIONS: carrying, lifting, sitting, standing, stairs, and locomotion level  PARTICIPATION LIMITATIONS: meal prep, cleaning, shopping, community activity, and yard work  PERSONAL FACTORS: Fitness, Past/current experiences, and Time since onset of injury/illness/exacerbation are also affecting patient's functional outcome.    GOALS: Goals reviewed with patient?  Yes  SHORT TERM GOALS: Target date: 11/30/2024  Patient will be I with initial HEP in order to progress with therapy. Baseline: HEP provided at eval Goal status: INITIAL  2.  Patient will report left hip and thigh pain </= 5/10 in order to reduce functional limitations Baseline: 8/10 pain Goal status: INITIAL  LONG TERM GOALS: Target date: 12/28/2024  Patient will be I with final HEP to maintain progress from PT. Baseline: HEP provided at eval Goal status: INITIAL  2.  Patient will report PSFS >/= 6 in order to indicate improvement in their functional ability. Baseline: 2.33 Goal status: INITIAL  3.  Patient will demonstrate left hip strength >/= 4-/5 MMT in order to improve walking tolerance and ability to get out of her truck without limitation Baseline: see strength deficits above Goal status: INITIAL  4.  Patient will be able to negotiate stairs reciprocally in order to improve her household mobility Baseline: patient reports unable to go up/down stairs reciprocally Goal status: INITIAL   PLAN: PT FREQUENCY: 1x/week  PT DURATION: 8 weeks  PLANNED INTERVENTIONS: 97164- PT Re-evaluation, 97750- Physical Performance Testing, 97110-Therapeutic exercises, 97530- Therapeutic activity, 97112- Neuromuscular re-education, 97535- Self Care, 02859- Manual therapy, [id number]- Gait training, [phone] (1-2 muscles), 20561 (3+ muscles)- Dry Needling, Patient/Family education, Balance training, Stair training, Joint mobilization, Joint manipulation, Spinal manipulation, Spinal mobilization, Cryotherapy, and Moist heat.  PLAN FOR NEXT SESSION: Review HEP and progress PRN, manual/mobs for lumbar and left hip as needed, address flexibility deficits, progress core and hip strengthening    Elaine Daring, PT, DPT, LAT, ATC 11/30/24  11:39 AM Phone: [phone] Fax: [phone]

## 2024-12-07 ENCOUNTER — Encounter: Payer: Self-pay | Admitting: Physical Therapy

## 2024-12-07 ENCOUNTER — Ambulatory Visit: Admitting: Physical Therapy

## 2024-12-07 ENCOUNTER — Other Ambulatory Visit: Payer: Self-pay

## 2024-12-07 DIAGNOSIS — M25552 Pain in left hip: Secondary | ICD-10-CM

## 2024-12-07 DIAGNOSIS — M6281 Muscle weakness (generalized): Secondary | ICD-10-CM

## 2024-12-07 NOTE — Therapy (Addendum)
 " OUTPATIENT PHYSICAL THERAPY TREATMENT  DISCHARGE   Patient Name: Jennifer Gross MRN: [id number] DOB:03/01/1941, 83 y.o., female Today's Date: 12/07/2024   END OF SESSION:  PT End of Session - 12/07/24 1301     Visit Number 5    Number of Visits 9    Date for Recertification  12/28/24    Authorization Type [employer] MCR    Authorization Time Period 11/02/2024 - 01/31/2025    Authorization - Visit Number 5    Authorization - Number of Visits 8    Progress Note Due on Visit 10    PT Start Time 1301    PT Stop Time 1340    PT Time Calculation (min) 39 min    Activity Tolerance Patient tolerated treatment well    Behavior During Therapy [facility] for tasks assessed/performed              Past Medical History:  Diagnosis Date   ALLERGIC RHINITIS 04/08/2008   Allergy    Basal cell carcinoma    back   CAD (coronary artery disease) 2008   status post anterior myocardial infarction, treated w/ a dru-eluting stent to the LAD   CAD, NATIVE VESSEL 11/10/2009   GERD (gastroesophageal reflux disease)    Hiatal hernia    HYPERLIPIDEMIA 03/29/2008   HYPERTENSION 03/29/2008   Myocardial infarction [facility])    2008   Osteopenia    Past Surgical History:  Procedure Laterality Date   ANTERIOR AND POSTERIOR REPAIR WITH SACROSPINOUS FIXATION N/A 07/20/2018   Procedure: ANTERIOR AND POSTERIOR REPAIR WITH SACROSPINOUS FIXATION;  Surgeon: Leva Rush, MD;  Location: [facility];  Service: Gynecology;  Laterality: N/A;   APPENDECTOMY     BASAL CELL CARCINOMA EXCISION     back   BLADDER SUSPENSION N/A 07/20/2018   Procedure: TRANSOBTURATOR VALORIE INFANTE;  Surgeon: Leva Rush, MD;  Location: [facility];  Service: Gynecology;  Laterality: N/A;   CATARACT EXTRACTION Bilateral    LAPAROSCOPIC ASSISTED VAGINAL HYSTERECTOMY Bilateral    LAPAROSCOPIC VAGINAL HYSTERECTOMY WITH SALPINGO OOPHORECTOMY Bilateral 07/20/2018   Procedure: LAPAROSCOPIC ASSISTED VAGINAL HYSTERECTOMY WITH SALPINGO OOPHORECTOMY;  Surgeon:  Leva Rush, MD;  Location: [facility];  Service: Gynecology;  Laterality: Bilateral;   Percutaneous transluminal coronary angioplasty and drug-eluting  09/2007   Stent placement in the left anterior descending   WISDOM TOOTH EXTRACTION     Patient Active Problem List   Diagnosis Date Noted   Hyperglycemia, unspecified 05/11/2020   Left hip pain 02/10/2019   Left shoulder pain 02/10/2019   S/P laparoscopic assisted vaginal hysterectomy (LAVH) 07/20/2018   Chronic pain of right knee 10/12/2017   History of basal cell carcinoma of skin    Osteoporosis    CAD (coronary artery disease) 11/10/2009   GERD 05/05/2008   Allergic rhinitis 04/08/2008   Hyperlipidemia 03/29/2008   Essential hypertension 03/29/2008    PCP: Katrinka Garnette KIDD, MD  REFERRING PROVIDER: Joane Artist RAMAN, MD  REFERRING DIAG: Left hip pain; Chronic bilateral low back pain with left-sided sciatica  Rationale for Evaluation and Treatment: Rehabilitation  THERAPY DIAG:  Pain in left hip  Muscle weakness (generalized)  ONSET DATE: Chronic (referral date 10/26/2024)   SUBJECTIVE:         SUBJECTIVE STATEMENT: Patient reports she felt really good yesterday so she did a lot around the house, but when she woke up this morning she thinks she over-did it. She states she can tell a difference walking up the stairs and she can get out of  the truck better.   Eval: Patient reports soreness of left hip, states it just hurts. She did have an MRI that showed a torn tendon on the left hip. The pain is on the outside of the left hip, and also on the front of the hip that travels down the inner thigh to the knee. The pain started years ago and has just gotten worse. She reports pain can be worse at night. She also has trouble getting up and moving after she has been sitting, she has to hold on to something when she gets up. She also reports pain walking around her home and doing household chores. She does have some stairs at home and she  has to step up one stair at a time and use the railing. She denies any numbness or tingling.   PERTINENT HISTORY:  See PMH above  PAIN:  Are you having pain? Yes:  NPRS scale: 2/10 Pain location: Left hip Pain description: Sore Aggravating factors: Getting up from a chair, walking Relieving factors: Tylenol   PRECAUTIONS: None  PATIENT GOALS: Pain relief   OBJECTIVE:  Note: Objective measures were completed at Evaluation unless otherwise noted. PATIENT SURVEYS:  PSFS: 2.33 Climbing stairs normally: 4 Getting out of the truck: 1 Getting out of a chair after watching TV: 2  MUSCLE LENGTH: Patient demonstrates limitations in bilateral hamstring and left hip flexor/quad   POSTURE:   Rounded shoulder posture, reduced lumbar lordosis  PALPATION: Tender to palpation left lateral hip and greater trochanteric region  LUMBAR ROM:   AROM eval  Flexion 75%  Extension 75%  Right lateral flexion WFL Reports left lateral hip pain  Left lateral flexion WFL  Right rotation 75%  Left rotation 75%   (Blank rows = not tested)  LOWER EXTREMITY ROM:      Hip PROM grossly WFL  LOWER EXTREMITY MMT:    MMT Right eval Left eval  Hip flexion 4- 4-  Hip extension 4- 3  Hip abduction 4- 3+  Hip adduction    Hip internal rotation    Hip external rotation    Knee flexion 4 4  Knee extension 4 4  Ankle dorsiflexion    Ankle plantarflexion    Ankle inversion    Ankle eversion     (Blank rows = not tested)  LUMBAR SPECIAL TESTS:  Slump and SLR negative for reproduction of symptoms  FUNCTIONAL TESTS:  5 times sit to stand: 16 seconds  GAIT: Assistive device utilized: None Level of assistance: Complete Independence Comments: Trendelenburg on left   TREATMENT [facility] Adult PT Treatment:                                                DATE: 12/07/2024 Recumbent bike L3 x 5 min (fwd/bwd) to improve endurance and workload capacity SLR 2 x 10 Bridge 2 x 10 Side clamshell with  yellow 3 x 10 each Sit to stand x 10, holding 10# at chest 2 x 10 Forward 8 step-up 2 x 10 each LAQ with 3# 3 x 10 each Tandem stance 3 x 30 sec each  PATIENT EDUCATION:  Education details: HEP Person educated: Patient Education method: Programmer, Multimedia, Demonstration, Actor cues, Verbal cues Education comprehension: verbalized understanding, returned demonstration, verbal cues required, tactile cues required, and needs further education  HOME EXERCISE PROGRAM: Access Code: [email or web address]    ASSESSMENT:  CLINICAL IMPRESSION: Patient tolerated therapy well with no adverse effects. Therapy focused on continued strengthening for the hips and LE, and balance training with good tolerance. She was able to progress with her LE strengthening with addition of weight with her sit to stands, increased step height, and weighted LAQ for quad strengthening. She tolerates hip strengthening well and did not report any increase in pain with therapy. She does report contiued challenge with her balance exercise. No changes made to her HEP this visit. Patient would benefit from continued skilled PT to progress mobility and strength in order to reduce pain and maximize functional ability.   Eval: Patient is a 83 y.o. female who was seen today for physical therapy evaluation and treatment for chronic left hip thigh pain that seems consistent with gluteal tendinopathy with possible lumbar radicular symptoms into the left groin and inner that to the knee. She demonstrates slight limitations with her lumbar mobility, gross strength deficits of the left hip, and flexibility deficits that are likely contributing to her symptoms and impacting her functional ability.   OBJECTIVE IMPAIRMENTS: Abnormal gait, decreased activity tolerance, decreased ROM, decreased strength, impaired flexibility, postural dysfunction, and pain.   ACTIVITY LIMITATIONS: carrying, lifting, sitting, standing, stairs, and locomotion level  PARTICIPATION  LIMITATIONS: meal prep, cleaning, shopping, community activity, and yard work  PERSONAL FACTORS: Fitness, Past/current experiences, and Time since onset of injury/illness/exacerbation are also affecting patient's functional outcome.    GOALS: Goals reviewed with patient? Yes  SHORT TERM GOALS: Target date: 11/30/2024  Patient will be I with initial HEP in order to progress with therapy. Baseline: HEP provided at eval 12/07/2024: independent Goal status: MET  2.  Patient will report left hip and thigh pain </= 5/10 in order to reduce functional limitations Baseline: 8/10 pain 12/07/2024: 2/10 Goal status: MET  LONG TERM GOALS: Target date: 12/28/2024  Patient will be I with final HEP to maintain progress from PT. Baseline: HEP provided at eval Goal status: INITIAL  2.  Patient will report PSFS >/= 6 in order to indicate improvement in their functional ability. Baseline: 2.33 Goal status: INITIAL  3.  Patient will demonstrate left hip strength >/= 4-/5 MMT in order to improve walking tolerance and ability to get out of her truck without limitation Baseline: see strength deficits above Goal status: INITIAL  4.  Patient will be able to negotiate stairs reciprocally in order to improve her household mobility Baseline: patient reports unable to go up/down stairs reciprocally Goal status: INITIAL   PLAN: PT FREQUENCY: 1x/week  PT DURATION: 8 weeks  PLANNED INTERVENTIONS: 97164- PT Re-evaluation, 97750- Physical Performance Testing, 97110-Therapeutic exercises, 97530- Therapeutic activity, 97112- Neuromuscular re-education, 97535- Self Care, 02859- Manual therapy, [id number]- Gait training, [phone] (1-2 muscles), [id number] (3+ muscles)- Dry Needling, Patient/Family education, Balance training, Stair training, Joint mobilization, Joint manipulation, Spinal manipulation, Spinal mobilization, Cryotherapy, and Moist heat.  PLAN FOR NEXT SESSION: Review HEP and progress PRN, manual/mobs for lumbar  and left hip as needed, address flexibility deficits, progress core and hip strengthening    Elaine Daring, PT, DPT, LAT, ATC 12/07/2024  1:48 PM Phone: [phone] Fax: [phone]   PHYSICAL THERAPY DISCHARGE SUMMARY  Visits from Start of Care: 5  Current functional level related to goals / functional outcomes: See above   Remaining deficits: See above   Education / Equipment: HEP   Patient agrees to discharge. Patient goals were partially met. Patient is being discharged due to not returning since the last visit.  Elaine Daring, PT,  DPT, LAT, ATC 01/07/25  8:03 AM Phone: [phone] Fax: [phone]   "

## 2024-12-10 ENCOUNTER — Encounter: Payer: Self-pay | Admitting: Family Medicine

## 2024-12-10 NOTE — Discharge Instructions (Signed)

## 2024-12-13 ENCOUNTER — Inpatient Hospital Stay: Admission: RE | Admit: 2024-12-13 | Discharge: 2024-12-13 | Attending: Family Medicine | Admitting: Family Medicine

## 2024-12-13 DIAGNOSIS — G8929 Other chronic pain: Secondary | ICD-10-CM

## 2024-12-13 MED ORDER — IOPAMIDOL (ISOVUE-M 200) INJECTION 41%
1.0000 mL | Freq: Once | INTRAMUSCULAR | Status: AC
  Administered 2024-12-13: 1 mL via EPIDURAL

## 2024-12-13 MED ORDER — METHYLPREDNISOLONE ACETATE 40 MG/ML INJ SUSP (RADIOLOG
80.0000 mg | Freq: Once | INTRAMUSCULAR | Status: AC
  Administered 2024-12-13: 80 mg via EPIDURAL

## 2024-12-22 ENCOUNTER — Other Ambulatory Visit: Payer: Self-pay | Admitting: Family Medicine

## 2025-01-11 ENCOUNTER — Encounter: Payer: Self-pay | Admitting: Family Medicine

## 2025-01-11 ENCOUNTER — Ambulatory Visit: Admitting: Family Medicine

## 2025-01-11 ENCOUNTER — Ambulatory Visit: Payer: Self-pay | Admitting: Family Medicine

## 2025-01-11 VITALS — BP 127/64 | HR 75 | Temp 97.9°F | Ht 65.0 in | Wt 137.0 lb

## 2025-01-11 DIAGNOSIS — Z0000 Encounter for general adult medical examination without abnormal findings: Secondary | ICD-10-CM | POA: Diagnosis not present

## 2025-01-11 DIAGNOSIS — R252 Cramp and spasm: Secondary | ICD-10-CM

## 2025-01-11 DIAGNOSIS — E785 Hyperlipidemia, unspecified: Secondary | ICD-10-CM

## 2025-01-11 DIAGNOSIS — M810 Age-related osteoporosis without current pathological fracture: Secondary | ICD-10-CM

## 2025-01-11 DIAGNOSIS — Z131 Encounter for screening for diabetes mellitus: Secondary | ICD-10-CM | POA: Diagnosis not present

## 2025-01-11 DIAGNOSIS — R739 Hyperglycemia, unspecified: Secondary | ICD-10-CM | POA: Diagnosis not present

## 2025-01-11 LAB — CBC WITH DIFFERENTIAL/PLATELET
Basophils Absolute: 0 K/uL (ref 0.0–0.1)
Basophils Relative: 0.5 % (ref 0.0–3.0)
Eosinophils Absolute: 0.1 K/uL (ref 0.0–0.7)
Eosinophils Relative: 0.9 % (ref 0.0–5.0)
HCT: 39.2 % (ref 36.0–46.0)
Hemoglobin: 13 g/dL (ref 12.0–15.0)
Lymphocytes Relative: 24.1 % (ref 12.0–46.0)
Lymphs Abs: 2.2 K/uL (ref 0.7–4.0)
MCHC: 33.2 g/dL (ref 30.0–36.0)
MCV: 91.6 fl (ref 78.0–100.0)
Monocytes Absolute: 0.6 K/uL (ref 0.1–1.0)
Monocytes Relative: 6.8 % (ref 3.0–12.0)
Neutro Abs: 6.1 K/uL (ref 1.4–7.7)
Neutrophils Relative %: 67.7 % (ref 43.0–77.0)
Platelets: 262 K/uL (ref 150.0–400.0)
RBC: 4.28 Mil/uL (ref 3.87–5.11)
RDW: 15.1 % (ref 11.5–15.5)
WBC: 9.1 K/uL (ref 4.0–10.5)

## 2025-01-11 LAB — COMPREHENSIVE METABOLIC PANEL WITH GFR
ALT: 19 U/L (ref 3–35)
AST: 27 U/L (ref 5–37)
Albumin: 4.2 g/dL (ref 3.5–5.2)
Alkaline Phosphatase: 71 U/L (ref 39–117)
BUN: 15 mg/dL (ref 6–23)
CO2: 31 meq/L (ref 19–32)
Calcium: 9.8 mg/dL (ref 8.4–10.5)
Chloride: 104 meq/L (ref 96–112)
Creatinine, Ser: 0.9 mg/dL (ref 0.40–1.20)
GFR: 58.76 mL/min — ABNORMAL LOW
Glucose, Bld: 96 mg/dL (ref 70–99)
Potassium: 3.9 meq/L (ref 3.5–5.1)
Sodium: 141 meq/L (ref 135–145)
Total Bilirubin: 0.7 mg/dL (ref 0.2–1.2)
Total Protein: 7.1 g/dL (ref 6.0–8.3)

## 2025-01-11 LAB — LIPID PANEL
Cholesterol: 130 mg/dL (ref 28–200)
HDL: 71.3 mg/dL
LDL Cholesterol: 47 mg/dL (ref 10–99)
NonHDL: 59.01
Total CHOL/HDL Ratio: 2
Triglycerides: 62 mg/dL (ref 10.0–149.0)
VLDL: 12.4 mg/dL (ref 0.0–40.0)

## 2025-01-11 LAB — HEMOGLOBIN A1C: Hgb A1c MFr Bld: 6.3 % (ref 4.6–6.5)

## 2025-01-11 LAB — MAGNESIUM: Magnesium: 1.8 mg/dL (ref 1.5–2.5)

## 2025-01-11 LAB — VITAMIN D 25 HYDROXY (VIT D DEFICIENCY, FRACTURES): VITD: 51.44 ng/mL (ref 30.00–100.00)

## 2025-01-11 NOTE — Patient Instructions (Addendum)
 Tetanus, Diphtheria, and Pertussis (Tdap) at pharmacy recommended  Ok to try magnesium glycinate before bed but I'm also checking magnesium. Another option on statins would be coenzyme q10(which it depletes). Stop if loose stools  Please stop by lab before you go If you have mychart- we will send your results within 3 business days of us  receiving them.  If you do not have mychart- we will call you about results within 5 business days of us  receiving them.  *please also note that you will see labs on mychart as soon as they post. I will later go in and write notes on them- will say notes from Dr. Katrinka   Recommended follow up: Return in about 6 months (around 07/11/2025) for followup or sooner if needed.Schedule b4 you leave.
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
## 2025-07-11 ENCOUNTER — Ambulatory Visit: Admitting: Family Medicine

## 2025-11-02 ENCOUNTER — Ambulatory Visit

## 2026-01-13 ENCOUNTER — Encounter: Admitting: Family Medicine
